# Patient Record
Sex: Female | Born: 1990 | Race: Black or African American | Hispanic: No | Marital: Single | Smoking: Never smoker
Health system: Southern US, Community
[De-identification: ages and names within clinical notes are randomized; demographics above are authoritative.]

## PROBLEM LIST (undated history)

## (undated) ENCOUNTER — Emergency Department (HOSPITAL_COMMUNITY): Admission: EM | Payer: BLUE CROSS/BLUE SHIELD | Source: Home / Self Care

## (undated) DIAGNOSIS — L659 Nonscarring hair loss, unspecified: Secondary | ICD-10-CM

## (undated) DIAGNOSIS — D509 Iron deficiency anemia, unspecified: Secondary | ICD-10-CM

## (undated) DIAGNOSIS — R0789 Other chest pain: Secondary | ICD-10-CM

## (undated) DIAGNOSIS — F419 Anxiety disorder, unspecified: Secondary | ICD-10-CM

## (undated) HISTORY — DX: Nonscarring hair loss, unspecified: L65.9

## (undated) HISTORY — PX: TONSILLECTOMY: SUR1361

---

## 2017-03-31 DIAGNOSIS — F419 Anxiety disorder, unspecified: Secondary | ICD-10-CM | POA: Diagnosis not present

## 2017-03-31 DIAGNOSIS — R202 Paresthesia of skin: Secondary | ICD-10-CM | POA: Diagnosis not present

## 2017-04-13 DIAGNOSIS — R002 Palpitations: Secondary | ICD-10-CM | POA: Diagnosis not present

## 2017-04-16 DIAGNOSIS — R002 Palpitations: Secondary | ICD-10-CM | POA: Diagnosis not present

## 2017-05-18 DIAGNOSIS — H10232 Serous conjunctivitis, except viral, left eye: Secondary | ICD-10-CM | POA: Diagnosis not present

## 2017-05-24 DIAGNOSIS — Z3042 Encounter for surveillance of injectable contraceptive: Secondary | ICD-10-CM | POA: Diagnosis not present

## 2017-05-26 DIAGNOSIS — R0789 Other chest pain: Secondary | ICD-10-CM | POA: Diagnosis not present

## 2017-05-26 DIAGNOSIS — R002 Palpitations: Secondary | ICD-10-CM | POA: Diagnosis not present

## 2017-05-31 DIAGNOSIS — N898 Other specified noninflammatory disorders of vagina: Secondary | ICD-10-CM | POA: Diagnosis not present

## 2017-06-04 DIAGNOSIS — R51 Headache: Secondary | ICD-10-CM | POA: Diagnosis not present

## 2017-06-18 DIAGNOSIS — R079 Chest pain, unspecified: Secondary | ICD-10-CM | POA: Diagnosis not present

## 2017-06-18 DIAGNOSIS — M940 Chondrocostal junction syndrome [Tietze]: Secondary | ICD-10-CM | POA: Diagnosis not present

## 2017-06-28 DIAGNOSIS — N898 Other specified noninflammatory disorders of vagina: Secondary | ICD-10-CM | POA: Diagnosis not present

## 2017-07-03 DIAGNOSIS — B349 Viral infection, unspecified: Secondary | ICD-10-CM | POA: Diagnosis not present

## 2017-07-03 DIAGNOSIS — R51 Headache: Secondary | ICD-10-CM | POA: Diagnosis not present

## 2017-07-07 DIAGNOSIS — R079 Chest pain, unspecified: Secondary | ICD-10-CM | POA: Diagnosis not present

## 2017-07-07 DIAGNOSIS — R000 Tachycardia, unspecified: Secondary | ICD-10-CM | POA: Diagnosis not present

## 2017-07-07 DIAGNOSIS — R457 State of emotional shock and stress, unspecified: Secondary | ICD-10-CM | POA: Diagnosis not present

## 2017-07-12 DIAGNOSIS — H11431 Conjunctival hyperemia, right eye: Secondary | ICD-10-CM | POA: Diagnosis not present

## 2017-07-12 DIAGNOSIS — H5789 Other specified disorders of eye and adnexa: Secondary | ICD-10-CM | POA: Diagnosis not present

## 2017-07-16 DIAGNOSIS — R002 Palpitations: Secondary | ICD-10-CM | POA: Diagnosis not present

## 2017-07-24 DIAGNOSIS — M25572 Pain in left ankle and joints of left foot: Secondary | ICD-10-CM | POA: Diagnosis not present

## 2017-07-24 DIAGNOSIS — M79672 Pain in left foot: Secondary | ICD-10-CM | POA: Diagnosis not present

## 2017-07-29 DIAGNOSIS — X58XXXA Exposure to other specified factors, initial encounter: Secondary | ICD-10-CM | POA: Diagnosis not present

## 2017-07-29 DIAGNOSIS — R002 Palpitations: Secondary | ICD-10-CM | POA: Diagnosis not present

## 2017-07-29 DIAGNOSIS — R079 Chest pain, unspecified: Secondary | ICD-10-CM | POA: Diagnosis not present

## 2017-07-29 DIAGNOSIS — R0789 Other chest pain: Secondary | ICD-10-CM | POA: Diagnosis not present

## 2017-07-29 DIAGNOSIS — F419 Anxiety disorder, unspecified: Secondary | ICD-10-CM | POA: Diagnosis not present

## 2017-07-29 DIAGNOSIS — S29019A Strain of muscle and tendon of unspecified wall of thorax, initial encounter: Secondary | ICD-10-CM | POA: Diagnosis not present

## 2017-07-31 DIAGNOSIS — F458 Other somatoform disorders: Secondary | ICD-10-CM | POA: Diagnosis not present

## 2017-07-31 DIAGNOSIS — F419 Anxiety disorder, unspecified: Secondary | ICD-10-CM | POA: Diagnosis not present

## 2017-07-31 DIAGNOSIS — F99 Mental disorder, not otherwise specified: Secondary | ICD-10-CM | POA: Diagnosis not present

## 2017-07-31 DIAGNOSIS — R000 Tachycardia, unspecified: Secondary | ICD-10-CM | POA: Diagnosis not present

## 2017-08-03 DIAGNOSIS — F410 Panic disorder [episodic paroxysmal anxiety] without agoraphobia: Secondary | ICD-10-CM | POA: Diagnosis not present

## 2017-08-03 DIAGNOSIS — Z111 Encounter for screening for respiratory tuberculosis: Secondary | ICD-10-CM | POA: Diagnosis not present

## 2017-08-03 DIAGNOSIS — F332 Major depressive disorder, recurrent severe without psychotic features: Secondary | ICD-10-CM | POA: Diagnosis not present

## 2017-08-05 DIAGNOSIS — G8929 Other chronic pain: Secondary | ICD-10-CM | POA: Diagnosis not present

## 2017-08-05 DIAGNOSIS — R079 Chest pain, unspecified: Secondary | ICD-10-CM | POA: Diagnosis not present

## 2017-08-05 DIAGNOSIS — F419 Anxiety disorder, unspecified: Secondary | ICD-10-CM | POA: Diagnosis not present

## 2017-08-25 DIAGNOSIS — B373 Candidiasis of vulva and vagina: Secondary | ICD-10-CM | POA: Diagnosis not present

## 2017-08-25 DIAGNOSIS — Z113 Encounter for screening for infections with a predominantly sexual mode of transmission: Secondary | ICD-10-CM | POA: Diagnosis not present

## 2017-08-25 DIAGNOSIS — Z3200 Encounter for pregnancy test, result unknown: Secondary | ICD-10-CM | POA: Diagnosis not present

## 2017-08-25 DIAGNOSIS — Z30013 Encounter for initial prescription of injectable contraceptive: Secondary | ICD-10-CM | POA: Diagnosis not present

## 2017-09-04 ENCOUNTER — Emergency Department (HOSPITAL_COMMUNITY)
Admission: EM | Admit: 2017-09-04 | Discharge: 2017-09-04 | Disposition: A | Discharge status: Home / Self Care | Payer: Self-pay | Attending: Emergency Medicine | Admitting: Emergency Medicine

## 2017-09-04 ENCOUNTER — Encounter (HOSPITAL_COMMUNITY): Payer: Self-pay | Admitting: Emergency Medicine

## 2017-09-04 ENCOUNTER — Emergency Department (HOSPITAL_COMMUNITY): Payer: Self-pay

## 2017-09-04 DIAGNOSIS — G44221 Chronic tension-type headache, intractable: Secondary | ICD-10-CM | POA: Insufficient documentation

## 2017-09-04 DIAGNOSIS — R079 Chest pain, unspecified: Secondary | ICD-10-CM | POA: Insufficient documentation

## 2017-09-04 DIAGNOSIS — Z87891 Personal history of nicotine dependence: Secondary | ICD-10-CM | POA: Insufficient documentation

## 2017-09-04 DIAGNOSIS — R0789 Other chest pain: Secondary | ICD-10-CM

## 2017-09-04 HISTORY — DX: Anxiety disorder, unspecified: F41.9

## 2017-09-04 HISTORY — DX: Other chest pain: R07.89

## 2017-09-04 LAB — CBC
HCT: 33.9 % — ABNORMAL LOW (ref 36.0–46.0)
Hemoglobin: 11.6 g/dL — ABNORMAL LOW (ref 12.0–15.0)
MCH: 30.4 pg (ref 26.0–34.0)
MCHC: 34.2 g/dL (ref 30.0–36.0)
MCV: 89 fL (ref 78.0–100.0)
PLATELETS: 204 10*3/uL (ref 150–400)
RBC: 3.81 MIL/uL — ABNORMAL LOW (ref 3.87–5.11)
RDW: 12.2 % (ref 11.5–15.5)
WBC: 2.3 10*3/uL — AB (ref 4.0–10.5)

## 2017-09-04 LAB — BASIC METABOLIC PANEL
Anion gap: 7 (ref 5–15)
BUN: 7 mg/dL (ref 6–20)
CALCIUM: 9.3 mg/dL (ref 8.9–10.3)
CO2: 24 mmol/L (ref 22–32)
CREATININE: 0.74 mg/dL (ref 0.44–1.00)
Chloride: 108 mmol/L (ref 98–111)
GFR calc non Af Amer: >60 mL/min (ref >60)
Glucose, Bld: 90 mg/dL (ref 70–99)
Potassium: 3.2 mmol/L — ABNORMAL LOW (ref 3.5–5.1)
SODIUM: 139 mmol/L (ref 135–145)

## 2017-09-04 LAB — I-STAT BETA HCG BLOOD, ED (MC, WL, AP ONLY): I-stat hCG, quantitative: <5 m[IU]/mL (ref <5)

## 2017-09-04 LAB — I-STAT TROPONIN, ED: TROPONIN I, POC: 0 ng/mL (ref 0.00–0.08)

## 2017-09-04 MED ORDER — KETOROLAC TROMETHAMINE 30 MG/ML IJ SOLN
60.0000 mg | Freq: Once | INTRAMUSCULAR | Status: AC
  Administered 2017-09-04: 60 mg via INTRAMUSCULAR
  Filled 2017-09-04: qty 2

## 2017-09-04 MED ORDER — DICLOFENAC SODIUM 1 % TD GEL: 4.0000 g | Freq: Four times a day (QID) | TRANSDERMAL | 0 refills | Status: AC

## 2017-09-04 NOTE — ED Triage Notes (Signed)
Initially c/o headache that have been going on for two months.  Seen previously and was told they were tension headache.  Now endorses chest pain.  Reports it was diagnosed as chronic chest wall pain.

## 2017-09-04 NOTE — ED Provider Notes (Signed)
[facility] EMERGENCY DEPARTMENT Provider Note   CSN: [id number] Arrival date & time: 09/04/17  2028     History   Chief Complaint Chief Complaint  Patient presents with  . Headache    HPI Faith Ross is a 27 y.o. female with a history of panic disorder who presents to the ED with chronic headache and chest pain. She reports that her headache is an 8/10 pressure type pain in her bilateral temples, bilateral jaw, and posterior neck, and that this headache has been constantly present for the past 2-3 months. She denies changes in vision, numbness, tingling, weakness, n/v. She states that she has been diagnosed with tension-type headaches in the past. She has tried [facility] powder, Excedrin tension-type, and hot compresses without relief. She has never seen a cardiologist.  Pt reports that her stabbing 6/10 chest pain has been intermittently present for 1 year ever since she strained her muscles while lifting a heavy object at work. She localizes the pain to the bilateral subclavicular space and inferior to the left breast. The pain worsens with breathing and when she lays on her chest. She has tried ibuprofen, naproxen, and flexeril with no relief. She has been evaluated multiple times for this in the past and has been diagnosed in the past with chronic chest wall pain and costochondritis. She states that her panic attacks feel very different from this chest pain and are characterized by shortness of breath and numb arms.  Past Medical History:  Diagnosis Date  . Anxiety   . Chest wall pain     There are no active problems to display for this patient.   Past Surgical History:  Procedure Laterality Date  . TONSILLECTOMY       OB History   None      Home Medications    Prior to Admission medications   Medication Sig Start Date End Date Taking? Authorizing Provider  diclofenac sodium (VOLTAREN) 1 % GEL Apply 4 g topically 4 (four) times daily for 7 days. 09/04/17  09/11/17  Poseidon Pam, Cathleen Corti, MD    Family History No family history on file.  Social History Social History   Tobacco Use  . Smoking status: Former Games developer  . Smokeless tobacco: Never Used  Substance Use Topics  . Alcohol use: Never    Frequency: Never  . Drug use: Never     Allergies   Patient has no allergy information on record.   Review of Systems Review of Systems  Constitutional: Negative for chills and fever.  HENT: Negative for rhinorrhea and sore throat.   Eyes: Negative for visual disturbance.  Respiratory: Negative for cough and shortness of breath.   Cardiovascular: Positive for chest pain. Negative for leg swelling.  Gastrointestinal: Negative for abdominal pain, nausea and vomiting.  Genitourinary: Negative for difficulty urinating and dysuria.  Musculoskeletal: Positive for neck pain and neck stiffness.  Skin: Negative for rash and wound.  Neurological: Positive for headaches. Negative for weakness and numbness.     Physical Exam Updated Vital Signs BP 123/74 (BP Location: Right Arm)   Pulse 82   Temp 98.3 F (36.8 C) (Oral)   Resp 18   Ht 5\' 5"  (1.651 m)   Wt 54.4 kg (120 lb)   SpO2 100%   BMI 19.97 kg/m   Physical Exam  Constitutional: She is oriented to person, place, and time. She appears well-developed and well-nourished. No distress.  HENT:  Head: Normocephalic and atraumatic.  Mouth/Throat: Oropharynx is clear  and moist.  Eyes: Pupils are equal, round, and reactive to light. EOM are normal.  Neck: Normal range of motion. No neck rigidity.  Tense and tender posterior neck muscles.  Cardiovascular: Normal rate and regular rhythm. Exam reveals no gallop and no friction rub.  No murmur heard. Pulmonary/Chest: Effort normal and breath sounds normal. She has no wheezes. She has no rales.  Abdominal: Soft. Bowel sounds are normal. She exhibits no distension. There is no tenderness.  Musculoskeletal: She exhibits no edema.  TTP along the  infraclavicular space and under the left breast. No supra or infraclavicular lymphadenopathy.  Neurological: She is alert and oriented to person, place, and time. She has normal strength.  Skin: Skin is warm and dry. Capillary refill takes less than 2 seconds. No rash noted.  Psychiatric: She has a normal mood and affect. Her behavior is normal.     ED Treatments / Results  Labs (all labs ordered are listed, but only abnormal results are displayed) Labs Reviewed  BASIC METABOLIC PANEL - Abnormal; Notable for the following components:      Result Value   Potassium 3.2 (*)    All other components within normal limits  CBC - Abnormal; Notable for the following components:   WBC 2.3 (*)    RBC 3.81 (*)    Hemoglobin 11.6 (*)    HCT 33.9 (*)    All other components within normal limits  I-STAT TROPONIN, ED  I-STAT BETA HCG BLOOD, ED ([facility], [facility], [facility] ONLY)    EKG EKG Interpretation  Date/Time:  Saturday September 04 2017 20:39:22 EDT Ventricular Rate:  73 PR Interval:  142 QRS Duration: 72 QT Interval:  364 QTC Calculation: 401 R Axis:   69 Text Interpretation:  Normal sinus rhythm Normal ECG Confirmed by Gerhard MunchLockwood, Robert [id number]) on 09/04/2017 10:33:52 PM   Radiology Dg Chest 2 View  Result Date: 09/04/2017 CLINICAL DATA:  27 year old female with chest pain. EXAM: CHEST - 2 VIEW COMPARISON:  None. FINDINGS: The heart size and mediastinal contours are within normal limits. Both lungs are clear. The visualized skeletal structures are unremarkable. IMPRESSION: No active cardiopulmonary disease. Electronically Signed   By: Elgie CollardArash  Radparvar M.D.   On: 09/04/2017 21:19    Procedures Procedures (including critical care time)  Medications Ordered in ED Medications  ketorolac (TORADOL) 30 MG/ML injection 60 mg (has no administration in time range)     Initial Impression / Assessment and Plan / ED Course  I have reviewed the triage vital signs and the nursing notes.  Pertinent labs &  imaging results that were available during my care of the patient were reviewed by me and considered in my medical decision making (see chart for details).  Faith Ross is a 27 y.o. female with a history of panic disorder who presents to the ED with chronic headache for the past 2-3 months and chronic chest pain for the past year. She has been evaluated for these two problems multiple times in the past at outside EDs and is consistently diagnosed with tension-type headaches and chronic chest wall tenderness. However, all of the interventions she's tried have brought no relief. On arrival to the ED, pt is afebrile and hemodynamically stable. Physical exam is significant for tense and tender posterior neck muscles as well as chest wall tenderness. Labs are significant for negative troponin. EKG is NSR. CXR was unremarkable. Based on her history and benign work-up, her previous diagnoses seem appropriate. Pt was treated with IM toradol. Pt safe  for discharge home with return precautions of worsening or changing headaches and chest pain, shortness of breath, or changes in vision. Advised patient to f/u with neurology for chronic tension-type headaches and establish with a PCP given that she just moved to the area. Patient discharged home with a prescription for voltaren gel to apply to the areas of soreness on her chest wall.   Final Clinical Impressions(s) / ED Diagnoses   Final diagnoses:  Chronic tension-type headache, intractable  Chest wall tenderness    ED Discharge Orders        Ordered    diclofenac sodium (VOLTAREN) 1 % GEL  4 times daily     09/04/17 2242       Keith Cancio, Cathleen Corti, MD 09/04/17 2321    Gerhard Munch, MD 09/05/17 0000

## 2017-09-09 DIAGNOSIS — G44221 Chronic tension-type headache, intractable: Secondary | ICD-10-CM | POA: Diagnosis not present

## 2017-09-09 DIAGNOSIS — F419 Anxiety disorder, unspecified: Secondary | ICD-10-CM | POA: Diagnosis not present

## 2017-09-29 DIAGNOSIS — F411 Generalized anxiety disorder: Secondary | ICD-10-CM | POA: Diagnosis not present

## 2017-10-13 DIAGNOSIS — F410 Panic disorder [episodic paroxysmal anxiety] without agoraphobia: Secondary | ICD-10-CM | POA: Diagnosis not present

## 2017-10-18 DIAGNOSIS — G43009 Migraine without aura, not intractable, without status migrainosus: Secondary | ICD-10-CM | POA: Diagnosis not present

## 2017-10-18 DIAGNOSIS — M5412 Radiculopathy, cervical region: Secondary | ICD-10-CM | POA: Diagnosis not present

## 2017-10-18 DIAGNOSIS — M9901 Segmental and somatic dysfunction of cervical region: Secondary | ICD-10-CM | POA: Diagnosis not present

## 2017-10-20 DIAGNOSIS — F410 Panic disorder [episodic paroxysmal anxiety] without agoraphobia: Secondary | ICD-10-CM | POA: Diagnosis not present

## 2017-10-26 DIAGNOSIS — M9901 Segmental and somatic dysfunction of cervical region: Secondary | ICD-10-CM | POA: Diagnosis not present

## 2017-10-26 DIAGNOSIS — G43009 Migraine without aura, not intractable, without status migrainosus: Secondary | ICD-10-CM | POA: Diagnosis not present

## 2017-10-26 DIAGNOSIS — M5412 Radiculopathy, cervical region: Secondary | ICD-10-CM | POA: Diagnosis not present

## 2017-10-27 DIAGNOSIS — M5412 Radiculopathy, cervical region: Secondary | ICD-10-CM | POA: Diagnosis not present

## 2017-10-27 DIAGNOSIS — G43009 Migraine without aura, not intractable, without status migrainosus: Secondary | ICD-10-CM | POA: Diagnosis not present

## 2017-10-27 DIAGNOSIS — M9901 Segmental and somatic dysfunction of cervical region: Secondary | ICD-10-CM | POA: Diagnosis not present

## 2017-10-28 DIAGNOSIS — F410 Panic disorder [episodic paroxysmal anxiety] without agoraphobia: Secondary | ICD-10-CM | POA: Diagnosis not present

## 2017-11-01 DIAGNOSIS — M5412 Radiculopathy, cervical region: Secondary | ICD-10-CM | POA: Diagnosis not present

## 2017-11-01 DIAGNOSIS — G43009 Migraine without aura, not intractable, without status migrainosus: Secondary | ICD-10-CM | POA: Diagnosis not present

## 2017-11-01 DIAGNOSIS — M9901 Segmental and somatic dysfunction of cervical region: Secondary | ICD-10-CM | POA: Diagnosis not present

## 2017-11-22 DIAGNOSIS — M9901 Segmental and somatic dysfunction of cervical region: Secondary | ICD-10-CM | POA: Diagnosis not present

## 2017-11-22 DIAGNOSIS — G43009 Migraine without aura, not intractable, without status migrainosus: Secondary | ICD-10-CM | POA: Diagnosis not present

## 2017-11-22 DIAGNOSIS — M5412 Radiculopathy, cervical region: Secondary | ICD-10-CM | POA: Diagnosis not present

## 2017-11-30 ENCOUNTER — Emergency Department (HOSPITAL_COMMUNITY)
Admission: EM | Admit: 2017-11-30 | Discharge: 2017-11-30 | Disposition: A | Discharge status: Home / Self Care | Payer: BLUE CROSS/BLUE SHIELD | Attending: Emergency Medicine | Admitting: Emergency Medicine

## 2017-11-30 ENCOUNTER — Encounter: Payer: Self-pay | Admitting: Emergency Medicine

## 2017-11-30 ENCOUNTER — Emergency Department (HOSPITAL_COMMUNITY): Payer: BLUE CROSS/BLUE SHIELD

## 2017-11-30 DIAGNOSIS — R079 Chest pain, unspecified: Secondary | ICD-10-CM | POA: Diagnosis not present

## 2017-11-30 DIAGNOSIS — F419 Anxiety disorder, unspecified: Secondary | ICD-10-CM | POA: Insufficient documentation

## 2017-11-30 DIAGNOSIS — R000 Tachycardia, unspecified: Secondary | ICD-10-CM | POA: Diagnosis not present

## 2017-11-30 DIAGNOSIS — R002 Palpitations: Secondary | ICD-10-CM | POA: Diagnosis not present

## 2017-11-30 DIAGNOSIS — Z79899 Other long term (current) drug therapy: Secondary | ICD-10-CM | POA: Diagnosis not present

## 2017-11-30 DIAGNOSIS — E876 Hypokalemia: Secondary | ICD-10-CM | POA: Insufficient documentation

## 2017-11-30 DIAGNOSIS — D709 Neutropenia, unspecified: Secondary | ICD-10-CM | POA: Diagnosis not present

## 2017-11-30 DIAGNOSIS — R457 State of emotional shock and stress, unspecified: Secondary | ICD-10-CM | POA: Diagnosis not present

## 2017-11-30 LAB — URINALYSIS, ROUTINE W REFLEX MICROSCOPIC
BILIRUBIN URINE: NEGATIVE
Glucose, UA: NEGATIVE mg/dL
HGB URINE DIPSTICK: NEGATIVE
KETONES UR: NEGATIVE mg/dL
NITRITE: NEGATIVE
Protein, ur: NEGATIVE mg/dL
SPECIFIC GRAVITY, URINE: 1.006 (ref 1.005–1.030)
pH: 6 (ref 5.0–8.0)

## 2017-11-30 LAB — CBC WITH DIFFERENTIAL/PLATELET
BASOS ABS: 0 10*3/uL (ref 0.0–0.1)
BASOS PCT: 0 %
EOS ABS: 0 10*3/uL (ref 0.0–0.5)
Eosinophils Relative: 0 %
HCT: 32 % — ABNORMAL LOW (ref 36.0–46.0)
Hemoglobin: 11.1 g/dL — ABNORMAL LOW (ref 12.0–15.0)
Lymphocytes Relative: 70 %
Lymphs Abs: 1.3 10*3/uL (ref 0.7–4.0)
MCH: 30.7 pg (ref 26.0–34.0)
MCHC: 34.7 g/dL (ref 30.0–36.0)
MCV: 88.6 fL (ref 80.0–100.0)
Monocytes Absolute: 0.2 10*3/uL (ref 0.1–1.0)
Monocytes Relative: 12 %
NEUTROS PCT: 18 %
NRBC: 0 % (ref 0.0–0.2)
Neutro Abs: 0.3 10*3/uL — ABNORMAL LOW (ref 1.7–7.7)
PLATELETS: 205 10*3/uL (ref 150–400)
RBC: 3.61 MIL/uL — ABNORMAL LOW (ref 3.87–5.11)
RDW: 11.9 % (ref 11.5–15.5)
WBC: 1.8 10*3/uL — ABNORMAL LOW (ref 4.0–10.5)

## 2017-11-30 LAB — COMPREHENSIVE METABOLIC PANEL
ALBUMIN: 3.2 g/dL — AB (ref 3.5–5.0)
ALT: 16 U/L (ref 0–44)
AST: 18 U/L (ref 15–41)
Alkaline Phosphatase: 28 U/L — ABNORMAL LOW (ref 38–126)
Anion gap: 5 (ref 5–15)
BILIRUBIN TOTAL: 0.4 mg/dL (ref 0.3–1.2)
BUN: 6 mg/dL (ref 6–20)
CO2: 23 mmol/L (ref 22–32)
Calcium: 8.2 mg/dL — ABNORMAL LOW (ref 8.9–10.3)
Chloride: 114 mmol/L — ABNORMAL HIGH (ref 98–111)
Creatinine, Ser: 0.71 mg/dL (ref 0.44–1.00)
GFR calc Af Amer: >60 mL/min (ref >60)
Glucose, Bld: 77 mg/dL (ref 70–99)
Potassium: 3.1 mmol/L — ABNORMAL LOW (ref 3.5–5.1)
Sodium: 142 mmol/L (ref 135–145)
TOTAL PROTEIN: 6.5 g/dL (ref 6.5–8.1)

## 2017-11-30 LAB — RAPID URINE DRUG SCREEN, HOSP PERFORMED
Amphetamines: NOT DETECTED
Barbiturates: NOT DETECTED
Benzodiazepines: NOT DETECTED
COCAINE: NOT DETECTED
Opiates: NOT DETECTED
TETRAHYDROCANNABINOL: NOT DETECTED

## 2017-11-30 LAB — I-STAT BETA HCG BLOOD, ED (MC, WL, AP ONLY)

## 2017-11-30 LAB — MAGNESIUM: Magnesium: 1.9 mg/dL (ref 1.7–2.4)

## 2017-11-30 LAB — I-STAT TROPONIN, ED: TROPONIN I, POC: 0 ng/mL (ref 0.00–0.08)

## 2017-11-30 MED ORDER — POTASSIUM CHLORIDE CRYS ER 20 MEQ PO TBCR
40.0000 meq | EXTENDED_RELEASE_TABLET | Freq: Once | ORAL | Status: AC
  Administered 2017-11-30: 40 meq via ORAL
  Filled 2017-11-30: qty 2

## 2017-11-30 MED ORDER — SODIUM CHLORIDE 0.9 % IV BOLUS
1000.0000 mL | Freq: Once | INTRAVENOUS | Status: AC
  Administered 2017-11-30: 1000 mL via INTRAVENOUS

## 2017-11-30 MED ORDER — METOPROLOL TARTRATE 25 MG PO TABS
12.5000 mg | ORAL_TABLET | Freq: Once | ORAL | Status: AC
  Administered 2017-11-30: 12.5 mg via ORAL
  Filled 2017-11-30: qty 1

## 2017-11-30 MED ORDER — KETOROLAC TROMETHAMINE 30 MG/ML IJ SOLN
15.0000 mg | Freq: Once | INTRAMUSCULAR | Status: AC
  Administered 2017-11-30: 15 mg via INTRAVENOUS
  Filled 2017-11-30: qty 1

## 2017-11-30 MED ORDER — METOPROLOL TARTRATE 25 MG PO TABS: 12.5000 mg | ORAL_TABLET | Freq: Two times a day (BID) | ORAL | 1 refills | Status: DC

## 2017-11-30 NOTE — ED Provider Notes (Signed)
Developed palpitations and feeling of rapid heartbeat onset 10 AM today.  Lasted 2 hours dull spontaneously.  Presently asymptomatic.  Denies any shortness of breath.  Denies feeling anxious.  No other associated symptoms.  On exam no distress lungs clear to auscultation heart regular rate and rhythm no murmurs   Doug Sou, MD 11/30/17 1706

## 2017-11-30 NOTE — ED Notes (Signed)
Pt stable, ambulatory, states understanding of discharge instructions 

## 2017-11-30 NOTE — ED Triage Notes (Signed)
Pt arrived via [facility] from home c/o several days of heart palpitations only when she is lying flat trying to go to sleep.  Pt states she has anxiety but doesn't have any medications for it.  Denies any chest pain.

## 2017-11-30 NOTE — ED Provider Notes (Signed)
[facility] EMERGENCY DEPARTMENT Provider Note   CSN: [id number] Arrival date & time: 11/30/17  1309     History   Chief Complaint Chief Complaint  Patient presents with  . Palpitations  . Anxiety    HPI Faith Ross is a 27 y.o. female.  HPI  Faith Ross is a 27 y.o. female, with a history of anxiety and palpitations, presenting to the ED with palpitations intermittently for the past year.  They only occur at times when she lays down to go to sleep. She does not know how long they last, but she is able to sleep.  Today when she laid down for a nap, she had the palpitations, but continued for about 20 minutes, accompanied by left chest pain, described as an aching, 3/10, nonradiating. States the area is still sore after the palpitations resolved. EMS noted patient's pulse around 150. Resolved with IV placement.   She was evaluated by a cardiologist earlier this year and she states the only thing they found was PACs. She has had multiple tests, including heart monitors and thyroid testing.   Denies syncope, shortness of breath, cough, fever/chills, N/V/D, abdominal pain, or any other complaints.    Past Medical History:  Diagnosis Date  . Anxiety   . Chest wall pain     There are no active problems to display for this patient.   Past Surgical History:  Procedure Laterality Date  . TONSILLECTOMY       OB History   None      Home Medications    Prior to Admission medications   Medication Sig Start Date End Date Taking? Authorizing Provider  medroxyPROGESTERone (DEPO-PROVERA) 150 MG/ML injection Inject 150 mg into the muscle every 3 (three) months. 05/24/17  Yes [provider]  metoprolol tartrate (LOPRESSOR) 25 MG tablet Take 0.5 tablets (12.5 mg total) by mouth 2 (two) times daily. 11/30/17 01/29/18  Anselm Pancoast, PA-C    Family History History reviewed. No pertinent family history.  Social History Social History   Tobacco  Use  . Smoking status: Former Games developer  . Smokeless tobacco: Never Used  Substance Use Topics  . Alcohol use: Never    Frequency: Never  . Drug use: Never     Allergies   Patient has no known allergies.   Review of Systems Review of Systems  Constitutional: Negative for chills, diaphoresis and fever.  Respiratory: Negative for shortness of breath.   Cardiovascular: Positive for chest pain and palpitations. Negative for leg swelling.  Gastrointestinal: Negative for abdominal pain, diarrhea, nausea and vomiting.  Neurological: Negative for dizziness, syncope and light-headedness.  All other systems reviewed and are negative.    Physical Exam Updated Vital Signs BP 109/82   Pulse 84   Temp 98.4 F (36.9 C) (Oral)   Resp 16   SpO2 98%   Physical Exam  Constitutional: She appears well-developed and well-nourished. No distress.  HENT:  Head: Normocephalic and atraumatic.  Eyes: Conjunctivae are normal.  Neck: Neck supple.  Cardiovascular: Normal rate, regular rhythm, normal heart sounds and intact distal pulses.  Pulmonary/Chest: Effort normal and breath sounds normal. No respiratory distress. She exhibits tenderness.    Abdominal: Soft. There is no tenderness. There is no guarding.  Musculoskeletal: She exhibits no edema.  Lymphadenopathy:    She has no cervical adenopathy.  Neurological: She is alert.  Skin: Skin is warm and dry. She is not diaphoretic.  Psychiatric: She has a normal mood and affect. Her  behavior is normal.  Nursing note and vitals reviewed.    ED Treatments / Results  Labs (all labs ordered are listed, but only abnormal results are displayed) Labs Reviewed  URINALYSIS, ROUTINE W REFLEX MICROSCOPIC - Abnormal; Notable for the following components:      Result Value   APPearance HAZY (*)    Leukocytes, UA TRACE (*)    Bacteria, UA RARE (*)    All other components within normal limits  COMPREHENSIVE METABOLIC PANEL - Abnormal; Notable for the  following components:   Potassium 3.1 (*)    Chloride 114 (*)    Calcium 8.2 (*)    Albumin 3.2 (*)    Alkaline Phosphatase 28 (*)    All other components within normal limits  CBC WITH DIFFERENTIAL/PLATELET - Abnormal; Notable for the following components:   WBC 1.8 (*)    RBC 3.61 (*)    Hemoglobin 11.1 (*)    HCT 32.0 (*)    Neutro Abs 0.3 (*)    All other components within normal limits  RAPID URINE DRUG SCREEN, HOSP PERFORMED  MAGNESIUM  I-STAT BETA HCG BLOOD, ED ([facility], [facility], [facility] ONLY)  I-STAT TROPONIN, ED    EKG EKG Interpretation  Date/Time:  Tuesday November 30 2017 13:25:05 EDT Ventricular Rate:  84 PR Interval:    QRS Duration: 84 QT Interval:  355 QTC Calculation: 420 R Axis:   77 Text Interpretation:  Sinus rhythm Artifact in lead(s) V3 No significant change since last tracing Confirmed by Doug Sou [phone]) on 11/30/2017 4:35:17 PM   Radiology Dg Chest 2 View  Result Date: 11/30/2017 CLINICAL DATA:  Chest pain and heart palpitations EXAM: CHEST - 2 VIEW COMPARISON:  09/04/2017 FINDINGS: Artifact from EKG leads. Normal heart size and mediastinal contours. No acute infiltrate or edema. No effusion or pneumothorax. No acute osseous findings. IMPRESSION: Negative chest. Electronically Signed   By: Marnee Spring M.D.   On: 11/30/2017 15:49    Procedures Procedures (including critical care time)  Medications Ordered in ED Medications  sodium chloride 0.9 % bolus 1,000 mL (0 mLs Intravenous Stopped 11/30/17 1656)  ketorolac (TORADOL) 30 MG/ML injection 15 mg (15 mg Intravenous Given 11/30/17 1548)  potassium chloride SA (K-DUR,KLOR-CON) CR tablet 40 mEq (40 mEq Oral Given 11/30/17 1659)  metoprolol tartrate (LOPRESSOR) tablet 12.5 mg (12.5 mg Oral Given 11/30/17 1920)     Initial Impression / Assessment and Plan / ED Course  I have reviewed the triage vital signs and the nursing notes.  Pertinent labs & imaging results that were available during my care of the  patient were reviewed by me and considered in my medical decision making (see chart for details).  Clinical Course as of Nov 30 2000  Tue Nov 30, 2017  1801 Spoke with Cardiology PA, Lisabeth Devoid. States he will go see the patient.    [SJ]    Clinical Course User Index [SJ] Joy, Shawn C, PA-C   Patient presents with palpitations that resolved prior to arrival.  She did have an instance of these palpitations here in the ED.  She was evaluated by cardiology, who recommended 12.5 mg metoprolol twice daily.  She will follow-up with cardiology in the office. The patient was given instructions for home care as well as return precautions. Patient voices understanding of these instructions, accepts the plan, and is comfortable with discharge.   Findings and plan of care discussed with Doug Sou, MD. Dr. Ethelda Chick personally evaluated and examined this patient.   Final  Clinical Impressions(s) / ED Diagnoses   Final diagnoses:  Palpitations  Hypokalemia    ED Discharge Orders         Ordered    metoprolol tartrate (LOPRESSOR) 25 MG tablet  2 times daily     11/30/17 1912           Concepcion Living 11/30/17 2003    Doug Sou, MD 11/30/17 [phone]

## 2017-11-30 NOTE — ED Notes (Signed)
Called main lab for the magnesium add on that was modified and sent an hour ago. Lab states they will look for the tube now.

## 2017-11-30 NOTE — ED Notes (Signed)
Magnesium changed to add on

## 2017-11-30 NOTE — ED Notes (Signed)
Pts alarm rang out tachy and monitor strip was printed and given to Dr. Rennis Chris. Also copy placed in medical records

## 2017-11-30 NOTE — ED Notes (Signed)
Patient transported to X-ray 

## 2017-11-30 NOTE — Progress Notes (Signed)
Tele strip reviewed with Dr. [place], felt to be sinus tach. Discussed EDP, as long as patient did not have dizziness associated with tachypalpitation. It is reasonable to discharge with a trial of metoprolol tartrate 12.5mg  BID and followup in the cardiology office within a week for reassessment.   Ramond Dial PA Pager: [phone]

## 2017-11-30 NOTE — Discharge Instructions (Addendum)
Begin taking the metoprolol, medication meant to prevent your heart from racing, twice daily.  Follow-up with cardiology this week.  Call the number provided to set up an appointment.  Return to the ED for recurrent symptoms despite medication, worsening symptoms, or any other major concerns.  Your potassium was lower than normal today.  Follow-up with a primary care provider on this matter.

## 2017-12-01 ENCOUNTER — Ambulatory Visit: Payer: BLUE CROSS/BLUE SHIELD | Admitting: Family Medicine

## 2017-12-01 ENCOUNTER — Ambulatory Visit (INDEPENDENT_AMBULATORY_CARE_PROVIDER_SITE_OTHER): Payer: BLUE CROSS/BLUE SHIELD | Admitting: Family Medicine

## 2017-12-01 VITALS — BP 101/67 | HR 66 | Temp 97.5°F | Resp 17 | Ht 64.0 in | Wt 118.2 lb

## 2017-12-01 DIAGNOSIS — F411 Generalized anxiety disorder: Secondary | ICD-10-CM

## 2017-12-01 DIAGNOSIS — R079 Chest pain, unspecified: Secondary | ICD-10-CM

## 2017-12-01 DIAGNOSIS — R002 Palpitations: Secondary | ICD-10-CM | POA: Diagnosis not present

## 2017-12-01 MED ORDER — BUSPIRONE HCL 10 MG PO TABS: 10.0000 mg | ORAL_TABLET | Freq: Three times a day (TID) | ORAL | 1 refills | Status: DC

## 2017-12-01 MED ORDER — ESCITALOPRAM OXALATE 10 MG PO TABS: 10.0000 mg | ORAL_TABLET | Freq: Every day | ORAL | 0 refills | Status: DC

## 2017-12-01 MED ORDER — METOPROLOL TARTRATE 25 MG PO TABS: 12.5000 mg | ORAL_TABLET | Freq: Every day | ORAL | 0 refills | Status: DC | PRN

## 2017-12-01 NOTE — Patient Instructions (Signed)
    Thank you for choosing Primary Care at [facility] for your medical home!    Truc Winfree was seen by Joaquin Courts, FNP today.   Bonnita Nasuti primary care doctor is Bing Neighbors, FNP.   For the best care possible,  you should try to see @PCP  whenever you come to clinic.   We look forward to seeing you again soon!  If you have any questions about your visit today,  please call us at   Or feel free to reach your provider via MyChart.

## 2017-12-01 NOTE — Progress Notes (Addendum)
Faith Ross, is a 27 y.o. female  WJX:[id number]  OZH:[id number]  DOB - [birth year]  CC:  Chief Complaint  Patient presents with  . Establish Care  . Follow-up    seen at ED on 11/30/17 for palpitations. no fam hx of heart problems. was prescribed Metoprolol. states that she didn't start taking it b/c she was on it in the past & felt like it caused her heart rate to drop really slow    HPI: Faith Ross is a 27 y.o. female is here today to establish care.    Chronic health problems include:does not have a problem list on file.   Current medications: Current Outpatient Medications:  .  medroxyPROGESTERone (DEPO-PROVERA) 150 MG/ML injection, Inject 150 mg into the muscle every 3 (three) months., Disp: , Rfl:  .  metoprolol tartrate (LOPRESSOR) 25 MG tablet, Take 0.5 tablets (12.5 mg total) by mouth 2 (two) times daily. (Patient not taking: Reported on 12/01/2017), Disp: 30 tablet, Rfl: 1   Pertinent family medical history: family history includes Thyroid disease in her mother.    Concerns related to today's visit:  Patient presents today for an ER follow-up. She has routinely received medical care from ER  and or urgent care. Most recent visit 11/30/2017 presented to the ER with a complaint of palpitations with a HR 150's. EKG was normal. Complained of associated chest pain which has since resolved. Previously followed by cardiology and underwent an extensive cardiac work-up which was negative. She was told that she had PAC.  History of anxiety-no current medication management for symptoms.  For palpitations she was started on metoprolol and discharged for follow-up here today. At present she denies new headaches, chest pain, abdominal pain, nausea, new weakness , numbness or tingling, SOB, edema, worrisome cough, suicidal or homicidal ideations.  No Known Allergies  Social History   Socioeconomic History  . Marital status: Single    Spouse name: Not on file  . Number of children: Not  on file  . Years of education: Not on file  . Highest education level: Not on file  Occupational History  . Not on file  Social Needs  . Financial resource strain: Not on file  . Food insecurity:    Worry: Not on file    Inability: Not on file  . Transportation needs:    Medical: Not on file    Non-medical: Not on file  Tobacco Use  . Smoking status: Former Games developer  . Smokeless tobacco: Never Used  Substance and Sexual Activity  . Alcohol use: Never    Frequency: Never  . Drug use: Never  . Sexual activity: Not on file  Lifestyle  . Physical activity:    Days per week: Not on file    Minutes per session: Not on file  . Stress: Not on file  Relationships  . Social connections:    Talks on phone: Not on file    Gets together: Not on file    Attends religious service: Not on file    Active member of club or organization: Not on file    Attends meetings of clubs or organizations: Not on file    Relationship status: Not on file  . Intimate partner violence:    Fear of current or ex partner: Not on file    Emotionally abused: Not on file    Physically abused: Not on file    Forced sexual activity: Not on file  Other Topics Concern  . Not on file  Social  History Narrative  . Not on file    Review of Systems: Review of Systems  Constitutional: Negative.   Eyes: Negative.   Respiratory: Negative.   Cardiovascular:       History of recurrent atypical chest pain and palpitations.  None at present  Gastrointestinal: Negative.   Psychiatric/Behavioral: Negative for depression, hallucinations, memory loss, substance abuse and suicidal ideas. The patient is nervous/anxious. The patient does not have insomnia.      Objective:   Vitals:   12/01/17 1454  BP: 101/67  Pulse: 66  Resp: 17  Temp: (!) 97.5 F (36.4 C)  SpO2: 100%    Physical Exam: Constitutional: Patient appears very thin, poorly -nourished. No distress. HENT: Normocephalic, atraumatic, External right  and left ear normal.  Eyes: Conjunctivae and EOM are normal. PERRLA, no scleral icterus. Neck: Normal ROM. Neck supple. No JVD. No tracheal deviation. No thyromegaly. CVS: RRR, S1/S2 +, no murmurs, no gallops, no carotid bruit.  Pulmonary: Effort and breath sounds normal, no stridor, rhonchi, wheezes, rales.  Abdominal: Soft. BS +, no distension, tenderness, rebound or guarding.  Musculoskeletal: Normal range of motion. No edema and no tenderness.  Neuro: Alert. Normal muscle tone coordination.  Skin: Skin is warm and dry. No rash noted. Not diaphoretic. No erythema. No pallor. Psychiatric: Very anxious with a flat affect. Behavior, judgment, thought content normal.  Lab Results  Component Value Date   WBC 1.8 (L) 11/30/2017   HGB 11.1 (L) 11/30/2017   HCT 32.0 (L) 11/30/2017   MCV 88.6 11/30/2017   PLT 205 11/30/2017   Lab Results  Component Value Date   CREATININE 0.71 11/30/2017   BUN 6 11/30/2017   NA 142 11/30/2017   K 3.1 (L) 11/30/2017   CL 114 (H) 11/30/2017   CO2 23 11/30/2017    No results found for: HGBA1C  Lipid Panel  No results found for: CHOL, TRIG, HDL, CHOLHDL, VLDL, LDLCALC      Assessment and plan:  1. Generalized Anxiety Disorder , persistent , currently active Will trial patient on sertraline 20 mg daily at bedtime. For persistent panic and anxiety attacks will start on buspirone 10 mg  2-3 times daily. Encouraged patient to resume counseling, which she discontinued a few weeks prior now that she is on medication. Dual therapy may yield better outcomes  2. Chest pain, unspecified type, resolved. This is more than likely related to anxiety.  In review of [facility] she does have a documented history of PACs which are typically benign and associated with excessive caffeine use. If episodes recur, will obtain an EKG to evaluate need for further work-up with cardiology.  3. Palpitations, soft for now.  Will make  metoprolol 12.5 mg once daily as needed only in the event palpitations occur.  Patient's blood pressure is rather soft and she reports associated fatigue with taking metoprolol likely related to a drop in blood pressure.  Patient verbalized agreement and understanding of plan.  Return in about 4 weeks (around 12/29/2017), or anxiety follow-up .  The patient was given clear instructions to go to ER or return to medical center if symptoms don't improve, worsen or new problems develop. The patient verbalized understanding. The patient was told to call to get lab results if they haven't heard anything in the next week.    Joaquin Courts, FNP Primary Care at [facility] [address] [place] [address] [phone]: [phone]   A total of 30 minutes spent, greater  than 50 % of this time was spent counseling and coordination of care.  This note has been created with Education officer, environmental. Any transcriptional errors are unintentional.

## 2017-12-01 NOTE — Progress Notes (Deleted)
Faith Ross, is a 27 y.o. female  ONG:[id number]  GMW:[id number]  DOB - 1990-12-24  CC: No chief complaint on file.      HPI: Faith Ross is a 27 y.o. female is here today to establish care. No recent healthcare provider. No significant medical history noted.    Patient presented to the ER on yesterday with a complaint of anxiety and chest wall pain. She found to have hypokalemic K+ 3.1 and complained of palpations.    Current medications: Current Outpatient Medications:  .  medroxyPROGESTERone (DEPO-PROVERA) 150 MG/ML injection, Inject 150 mg into the muscle every 3 (three) months., Disp: , Rfl:  .  metoprolol tartrate (LOPRESSOR) 25 MG tablet, Take 0.5 tablets (12.5 mg total) by mouth 2 (two) times daily., Disp: 30 tablet, Rfl: 1   Pertinent family medical history: family history is not on file.      No Known Allergies  Social History   Socioeconomic History  . Marital status: Single    Spouse name: Not on file  . Number of children: Not on file  . Years of education: Not on file  . Highest education level: Not on file  Occupational History  . Not on file  Social Needs  . Financial resource strain: Not on file  . Food insecurity:    Worry: Not on file    Inability: Not on file  . Transportation needs:    Medical: Not on file    Non-medical: Not on file  Tobacco Use  . Smoking status: Former Games developer  . Smokeless tobacco: Never Used  Substance and Sexual Activity  . Alcohol use: Never    Frequency: Never  . Drug use: Never  . Sexual activity: Not on file  Lifestyle  . Physical activity:    Days per week: Not on file    Minutes per session: Not on file  . Stress: Not on file  Relationships  . Social connections:    Talks on phone: Not on file    Gets together: Not on file    Attends religious service: Not on file    Active member of club or organization: Not on file    Attends meetings of clubs or organizations: Not on file    Relationship status: Not  on file  . Intimate partner violence:    Fear of current or ex partner: Not on file    Emotionally abused: Not on file    Physically abused: Not on file    Forced sexual activity: Not on file  Other Topics Concern  . Not on file  Social History Narrative  . Not on file    Review of Systems: Constitutional: Negative for fever, chills, diaphoresis, activity change, appetite change and fatigue. HENT: Negative for ear pain, nosebleeds, congestion, facial swelling, rhinorrhea, neck pain, neck stiffness and ear discharge.  Eyes: Negative for pain, discharge, redness, itching and visual disturbance. Respiratory: Negative for cough, choking, chest tightness, shortness of breath, wheezing and stridor.  Cardiovascular: Negative for chest pain, palpitations and leg swelling. Gastrointestinal: Negative for abdominal distention. Genitourinary: Negative for dysuria, urgency, frequency, hematuria, flank pain, decreased urine volume, difficulty urinating and dyspareunia.  Musculoskeletal: Negative for back pain, joint swelling, arthralgia and gait problem. Neurological: Negative for dizziness, tremors, seizures, syncope, facial asymmetry, speech difficulty, weakness, light-headedness, numbness and headaches.  Hematological: Negative for adenopathy. Does not bruise/bleed easily. Psychiatric/Behavioral: Negative for hallucinations, behavioral problems, confusion, dysphoric mood, decreased concentration and agitation.    Objective:  There were no vitals filed  for this visit.  Physical Exam: Constitutional: Patient appears well-developed and well-nourished. No distress. HENT: Normocephalic, atraumatic, External right and left ear normal. Oropharynx is clear and moist.  Eyes: Conjunctivae and EOM are normal. PERRLA, no scleral icterus. Neck: Normal ROM. Neck supple. No JVD. No tracheal deviation. No thyromegaly. CVS: RRR, S1/S2 +, no murmurs, no gallops, no carotid bruit.  Pulmonary: Effort and breath  sounds normal, no stridor, rhonchi, wheezes, rales.  Abdominal: Soft. BS +, no distension, tenderness, rebound or guarding.  Musculoskeletal: Normal range of motion. No edema and no tenderness.  Lymphadenopathy: No lymphadenopathy noted, cervical, inguinal or axillary Neuro: Alert. Normal reflexes, muscle tone coordination. No cranial nerve deficit. Skin: Skin is warm and dry. No rash noted. Not diaphoretic. No erythema. No pallor. Psychiatric: Normal mood and affect. Behavior, judgment, thought content normal.  Lab Results  Component Value Date   WBC 1.8 (L) 11/30/2017   HGB 11.1 (L) 11/30/2017   HCT 32.0 (L) 11/30/2017   MCV 88.6 11/30/2017   PLT 205 11/30/2017   Lab Results  Component Value Date   CREATININE 0.71 11/30/2017   BUN 6 11/30/2017   NA 142 11/30/2017   K 3.1 (L) 11/30/2017   CL 114 (H) 11/30/2017   CO2 23 11/30/2017    No results found for: HGBA1C  Lipid Panel  No results found for: CHOL, TRIG, HDL, CHOLHDL, VLDL, LDLCALC      Assessment and plan:   There are no diagnoses linked to this encounter.  No follow-ups on file.  The patient was given clear instructions to go to ER or return to medical center if symptoms don't improve, worsen or new problems develop. The patient verbalized understanding. The patient was told to call to get lab results if they haven't heard anything in the next week.     This note has been created with Education officer, environmental. Any transcriptional errors are unintentional.

## 2017-12-02 DIAGNOSIS — R002 Palpitations: Secondary | ICD-10-CM | POA: Insufficient documentation

## 2017-12-02 DIAGNOSIS — R079 Chest pain, unspecified: Secondary | ICD-10-CM | POA: Insufficient documentation

## 2017-12-02 DIAGNOSIS — F419 Anxiety disorder, unspecified: Secondary | ICD-10-CM | POA: Insufficient documentation

## 2017-12-02 LAB — PATHOLOGIST SMEAR REVIEW

## 2017-12-03 DIAGNOSIS — L638 Other alopecia areata: Secondary | ICD-10-CM | POA: Diagnosis not present

## 2017-12-07 DIAGNOSIS — Z3042 Encounter for surveillance of injectable contraceptive: Secondary | ICD-10-CM | POA: Diagnosis not present

## 2017-12-07 DIAGNOSIS — Z3200 Encounter for pregnancy test, result unknown: Secondary | ICD-10-CM | POA: Diagnosis not present

## 2017-12-07 DIAGNOSIS — Z3009 Encounter for other general counseling and advice on contraception: Secondary | ICD-10-CM | POA: Diagnosis not present

## 2017-12-13 ENCOUNTER — Emergency Department (HOSPITAL_COMMUNITY)
Admission: EM | Admit: 2017-12-13 | Discharge: 2017-12-13 | Disposition: A | Discharge status: Home / Self Care | Payer: BLUE CROSS/BLUE SHIELD | Attending: Emergency Medicine | Admitting: Emergency Medicine

## 2017-12-13 ENCOUNTER — Encounter (HOSPITAL_COMMUNITY): Payer: Self-pay

## 2017-12-13 DIAGNOSIS — R51 Headache: Secondary | ICD-10-CM | POA: Diagnosis not present

## 2017-12-13 DIAGNOSIS — G4489 Other headache syndrome: Secondary | ICD-10-CM | POA: Diagnosis not present

## 2017-12-13 DIAGNOSIS — R079 Chest pain, unspecified: Secondary | ICD-10-CM | POA: Diagnosis not present

## 2017-12-13 DIAGNOSIS — R519 Headache, unspecified: Secondary | ICD-10-CM

## 2017-12-13 DIAGNOSIS — R110 Nausea: Secondary | ICD-10-CM | POA: Diagnosis not present

## 2017-12-13 DIAGNOSIS — Z79899 Other long term (current) drug therapy: Secondary | ICD-10-CM | POA: Diagnosis not present

## 2017-12-13 DIAGNOSIS — I10 Essential (primary) hypertension: Secondary | ICD-10-CM | POA: Diagnosis not present

## 2017-12-13 DIAGNOSIS — Z87891 Personal history of nicotine dependence: Secondary | ICD-10-CM | POA: Insufficient documentation

## 2017-12-13 LAB — BASIC METABOLIC PANEL
Anion gap: 5 (ref 5–15)
BUN: 12 mg/dL (ref 6–20)
CHLORIDE: 111 mmol/L (ref 98–111)
CO2: 21 mmol/L — ABNORMAL LOW (ref 22–32)
CREATININE: 0.94 mg/dL (ref 0.44–1.00)
Calcium: 8.4 mg/dL — ABNORMAL LOW (ref 8.9–10.3)
GFR calc non Af Amer: >60 mL/min (ref >60)
Glucose, Bld: 98 mg/dL (ref 70–99)
Potassium: 4.1 mmol/L (ref 3.5–5.1)
Sodium: 137 mmol/L (ref 135–145)

## 2017-12-13 LAB — URINALYSIS, ROUTINE W REFLEX MICROSCOPIC
Bilirubin Urine: NEGATIVE
GLUCOSE, UA: NEGATIVE mg/dL
HGB URINE DIPSTICK: NEGATIVE
Ketones, ur: NEGATIVE mg/dL
Leukocytes, UA: NEGATIVE
Nitrite: NEGATIVE
PROTEIN: NEGATIVE mg/dL
Specific Gravity, Urine: 1.002 — ABNORMAL LOW (ref 1.005–1.030)
pH: 6 (ref 5.0–8.0)

## 2017-12-13 LAB — CBC WITH DIFFERENTIAL/PLATELET
ABS IMMATURE GRANULOCYTES: 0 10*3/uL (ref 0.00–0.07)
BASOS PCT: 0 %
Basophils Absolute: 0 10*3/uL (ref 0.0–0.1)
Eosinophils Absolute: 0 10*3/uL (ref 0.0–0.5)
Eosinophils Relative: 0 %
HEMATOCRIT: 30.8 % — AB (ref 36.0–46.0)
Hemoglobin: 9.9 g/dL — ABNORMAL LOW (ref 12.0–15.0)
Immature Granulocytes: 0 %
LYMPHS ABS: 0.9 10*3/uL (ref 0.7–4.0)
Lymphocytes Relative: 39 %
MCH: 29.3 pg (ref 26.0–34.0)
MCHC: 32.1 g/dL (ref 30.0–36.0)
MCV: 91.1 fL (ref 80.0–100.0)
MONO ABS: 0.2 10*3/uL (ref 0.1–1.0)
MONOS PCT: 8 %
Neutro Abs: 1.2 10*3/uL — ABNORMAL LOW (ref 1.7–7.7)
Neutrophils Relative %: 53 %
PLATELETS: 177 10*3/uL (ref 150–400)
RBC: 3.38 MIL/uL — ABNORMAL LOW (ref 3.87–5.11)
RDW: 12.1 % (ref 11.5–15.5)
WBC: 2.3 10*3/uL — ABNORMAL LOW (ref 4.0–10.5)
nRBC: 0 % (ref 0.0–0.2)

## 2017-12-13 LAB — RAPID URINE DRUG SCREEN, HOSP PERFORMED
AMPHETAMINES: NOT DETECTED
Barbiturates: NOT DETECTED
Benzodiazepines: NOT DETECTED
Cocaine: NOT DETECTED
Opiates: NOT DETECTED
TETRAHYDROCANNABINOL: NOT DETECTED

## 2017-12-13 MED ORDER — SODIUM CHLORIDE 0.9 % IV BOLUS
1000.0000 mL | Freq: Once | INTRAVENOUS | Status: AC
  Administered 2017-12-13: 1000 mL via INTRAVENOUS

## 2017-12-13 MED ORDER — KETOROLAC TROMETHAMINE 30 MG/ML IJ SOLN
30.0000 mg | Freq: Once | INTRAMUSCULAR | Status: AC
  Administered 2017-12-13: 30 mg via INTRAVENOUS
  Filled 2017-12-13: qty 1

## 2017-12-13 MED ORDER — DIPHENHYDRAMINE HCL 50 MG/ML IJ SOLN
12.5000 mg | Freq: Once | INTRAMUSCULAR | Status: AC
  Administered 2017-12-13: 12.5 mg via INTRAVENOUS
  Filled 2017-12-13: qty 1

## 2017-12-13 MED ORDER — METOCLOPRAMIDE HCL 5 MG/ML IJ SOLN
10.0000 mg | Freq: Once | INTRAMUSCULAR | Status: AC
  Administered 2017-12-13: 10 mg via INTRAVENOUS
  Filled 2017-12-13: qty 2

## 2017-12-13 NOTE — ED Triage Notes (Addendum)
Patient BIB [facility] for headache x 3 hours while at work. Patient also c/o chest pain, nausea, and photosensitivity. Patient denies abdominal pain, dizziness, SOB, fever, or dysuria. Hx headaches, "but not migraines".  [facility] gave 4mg  Zofran without relief.   BP 156/100 HR 112 98% RA RR 18

## 2017-12-13 NOTE — Discharge Instructions (Signed)
Return if any problems.   Try Excedrin migraine for headaches

## 2017-12-13 NOTE — ED Notes (Signed)
Patient ambulated to bathroom without assistance. Steady gait noted.  

## 2017-12-13 NOTE — ED Notes (Signed)
Pt verbalized understanding of d/c instructions and has no further questions, VSS, NAD.  

## 2017-12-13 NOTE — ED Provider Notes (Signed)
[facility] EMERGENCY DEPARTMENT Provider Note   CSN: [id number] Arrival date & time: 12/13/17  0559     History   Chief Complaint Chief Complaint  Patient presents with  . Headache    HPI Billiejean Schimek is a 27 y.o. female.  The history is provided by the patient. No language interpreter was used.  Headache   This is a new problem. The current episode started 3 to 5 hours ago. The problem occurs constantly. The problem has been gradually worsening. The headache is associated with nothing. The quality of the pain is described as throbbing. The pain does not radiate. Pertinent negatives include no vomiting. She has tried nothing for the symptoms. The treatment provided no relief.    Past Medical History:  Diagnosis Date  . Anxiety   . Chest wall pain     Patient Active Problem List   Diagnosis Date Noted  . Anxiety 12/02/2017  . Chest pain 12/02/2017  . Palpitations 12/02/2017    Past Surgical History:  Procedure Laterality Date  . TONSILLECTOMY       OB History   None      Home Medications    Prior to Admission medications   Medication Sig Start Date End Date Taking? Authorizing Provider  aspirin-acetaminophen-caffeine (EXCEDRIN MIGRAINE) [phone] MG tablet Take 1 tablet by mouth every 6 (six) hours as needed for headache.   Yes [provider]  ibuprofen (ADVIL,MOTRIN) 200 MG tablet Take 200 mg by mouth every 6 (six) hours as needed for headache.   Yes [provider]  medroxyPROGESTERone (DEPO-PROVERA) 150 MG/ML injection Inject 150 mg into the muscle every 3 (three) months. 05/24/17  Yes [provider]  busPIRone (BUSPAR) 10 MG tablet Take 1 tablet (10 mg total) by mouth 3 (three) times daily. 12/01/17   Bing Neighbors, FNP  escitalopram (LEXAPRO) 10 MG tablet Take 1 tablet (10 mg total) by mouth at bedtime. 12/01/17   Bing Neighbors, FNP  metoprolol tartrate (LOPRESSOR) 25 MG tablet Take 0.5 tablets  (12.5 mg total) by mouth daily as needed. 12/01/17 12/31/17  Bing Neighbors, FNP    Family History Family History  Problem Relation Age of Onset  . Thyroid disease Mother     Social History Social History   Tobacco Use  . Smoking status: Former Games developer  . Smokeless tobacco: Never Used  Substance Use Topics  . Alcohol use: Never    Frequency: Never  . Drug use: Never     Allergies   Patient has no known allergies.   Review of Systems Review of Systems  HENT: Positive for congestion.   Gastrointestinal: Negative for vomiting.  Neurological: Positive for headaches.  All other systems reviewed and are negative.    Physical Exam Updated Vital Signs BP 104/69   Pulse 88   Temp 98 F (36.7 C) (Oral)   Resp 18   Ht 5\' 5"  (1.651 m)   Wt 53.5 kg   SpO2 100%   BMI 19.64 kg/m   Physical Exam  Constitutional: She is oriented to person, place, and time. She appears well-developed and well-nourished.  HENT:  Head: Normocephalic.  Mouth/Throat: Oropharynx is clear and moist.  Eyes: Pupils are equal, round, and reactive to light. EOM are normal.  Neck: Normal range of motion.  Pulmonary/Chest: Effort normal.  Abdominal: She exhibits no distension.  Musculoskeletal: Normal range of motion.  Neurological: She is alert and oriented to person, place, and time. She has normal strength.  She displays a negative Romberg sign.  Skin: Skin is warm.  Psychiatric: She has a normal mood and affect.  Nursing note and vitals reviewed.    ED Treatments / Results  Labs (all labs ordered are listed, but only abnormal results are displayed) Labs Reviewed  CBC WITH DIFFERENTIAL/PLATELET - Abnormal; Notable for the following components:      Result Value   WBC 2.3 (*)    RBC 3.38 (*)    Hemoglobin 9.9 (*)    HCT 30.8 (*)    Neutro Abs 1.2 (*)    All other components within normal limits  URINALYSIS, ROUTINE W REFLEX MICROSCOPIC - Abnormal; Notable for the following components:    Color, Urine COLORLESS (*)    Specific Gravity, Urine 1.002 (*)    All other components within normal limits  BASIC METABOLIC PANEL - Abnormal; Notable for the following components:   CO2 21 (*)    Calcium 8.4 (*)    All other components within normal limits  RAPID URINE DRUG SCREEN, HOSP PERFORMED    EKG EKG Interpretation  Date/Time:  Monday December 13 2017 06:53:52 EDT Ventricular Rate:  75 PR Interval:    QRS Duration: 80 QT Interval:  372 QTC Calculation: 416 R Axis:   76 Text Interpretation:  Normal sinus rhythm no acute ST/T changes no significant change since Nov 30 2017 Confirmed by Pricilla Loveless [id number]) on 12/13/2017 6:57:05 AM Also confirmed by Pricilla Loveless [phone]), editor Elita Quick ([id number])  on 12/13/2017 7:29:37 AM   Radiology No results found.  Procedures Procedures (including critical care time)  Medications Ordered in ED Medications  sodium chloride 0.9 % bolus 1,000 mL (1,000 mLs Intravenous Bolus 12/13/17 0724)  metoCLOPramide (REGLAN) injection 10 mg (10 mg Intravenous Given 12/13/17 0725)  ketorolac (TORADOL) 30 MG/ML injection 30 mg (30 mg Intravenous Given 12/13/17 0728)  diphenhydrAMINE (BENADRYL) injection 12.5 mg (12.5 mg Intravenous Given 12/13/17 0725)     Initial Impression / Assessment and Plan / ED Course  I have reviewed the triage vital signs and the nursing notes.  Pertinent labs & imaging results that were available during my care of the patient were reviewed by me and considered in my medical decision making (see chart for details).     MDM  Pt's labs are normal.  Pt feels better after migraine cocktail.   Pt advised to return if any problems.    Final Clinical Impressions(s) / ED Diagnoses   Final diagnoses:  Bad headache    ED Discharge Orders    None    An After Visit Summary was printed and given to the patient.    Elson Areas, PA-C 12/13/17 1010    Pricilla Loveless, MD 12/13/17 [phone]

## 2017-12-16 ENCOUNTER — Encounter (HOSPITAL_COMMUNITY): Payer: Self-pay | Admitting: Emergency Medicine

## 2017-12-16 ENCOUNTER — Emergency Department (HOSPITAL_COMMUNITY)
Admission: EM | Admit: 2017-12-16 | Discharge: 2017-12-16 | Disposition: A | Discharge status: Home / Self Care | Payer: BLUE CROSS/BLUE SHIELD | Attending: Emergency Medicine | Admitting: Emergency Medicine

## 2017-12-16 DIAGNOSIS — Z79899 Other long term (current) drug therapy: Secondary | ICD-10-CM | POA: Diagnosis not present

## 2017-12-16 DIAGNOSIS — R000 Tachycardia, unspecified: Secondary | ICD-10-CM | POA: Diagnosis not present

## 2017-12-16 DIAGNOSIS — G44209 Tension-type headache, unspecified, not intractable: Secondary | ICD-10-CM

## 2017-12-16 DIAGNOSIS — Z87891 Personal history of nicotine dependence: Secondary | ICD-10-CM | POA: Diagnosis not present

## 2017-12-16 DIAGNOSIS — R110 Nausea: Secondary | ICD-10-CM | POA: Diagnosis not present

## 2017-12-16 LAB — CBC
HCT: 33.9 % — ABNORMAL LOW (ref 36.0–46.0)
HEMOGLOBIN: 11.5 g/dL — AB (ref 12.0–15.0)
MCH: 29.8 pg (ref 26.0–34.0)
MCHC: 33.9 g/dL (ref 30.0–36.0)
MCV: 87.8 fL (ref 80.0–100.0)
NRBC: 0 % (ref 0.0–0.2)
PLATELETS: 204 10*3/uL (ref 150–400)
RBC: 3.86 MIL/uL — AB (ref 3.87–5.11)
RDW: 11.8 % (ref 11.5–15.5)
WBC: 2.1 10*3/uL — AB (ref 4.0–10.5)

## 2017-12-16 LAB — COMPREHENSIVE METABOLIC PANEL
ALT: 21 U/L (ref 0–44)
ANION GAP: 6 (ref 5–15)
AST: 19 U/L (ref 15–41)
Albumin: 3.9 g/dL (ref 3.5–5.0)
Alkaline Phosphatase: 34 U/L — ABNORMAL LOW (ref 38–126)
BUN: 6 mg/dL (ref 6–20)
CHLORIDE: 107 mmol/L (ref 98–111)
CO2: 24 mmol/L (ref 22–32)
Calcium: 9.2 mg/dL (ref 8.9–10.3)
Creatinine, Ser: 0.84 mg/dL (ref 0.44–1.00)
GFR calc Af Amer: >60 mL/min (ref >60)
Glucose, Bld: 108 mg/dL — ABNORMAL HIGH (ref 70–99)
POTASSIUM: 3.5 mmol/L (ref 3.5–5.1)
SODIUM: 137 mmol/L (ref 135–145)
Total Bilirubin: 0.5 mg/dL (ref 0.3–1.2)
Total Protein: 7.9 g/dL (ref 6.5–8.1)

## 2017-12-16 LAB — URINALYSIS, ROUTINE W REFLEX MICROSCOPIC
Bilirubin Urine: NEGATIVE
GLUCOSE, UA: NEGATIVE mg/dL
HGB URINE DIPSTICK: NEGATIVE
KETONES UR: NEGATIVE mg/dL
NITRITE: NEGATIVE
PROTEIN: NEGATIVE mg/dL
Specific Gravity, Urine: 1.005 (ref 1.005–1.030)
pH: 7 (ref 5.0–8.0)

## 2017-12-16 LAB — I-STAT BETA HCG BLOOD, ED (MC, WL, AP ONLY): I-stat hCG, quantitative: <5 m[IU]/mL (ref <5)

## 2017-12-16 MED ORDER — KETOROLAC TROMETHAMINE 15 MG/ML IJ SOLN
15.0000 mg | Freq: Once | INTRAMUSCULAR | Status: AC
  Administered 2017-12-16: 15 mg via INTRAVENOUS
  Filled 2017-12-16: qty 1

## 2017-12-16 MED ORDER — METOCLOPRAMIDE HCL 5 MG/ML IJ SOLN
10.0000 mg | Freq: Once | INTRAMUSCULAR | Status: AC
  Administered 2017-12-16: 10 mg via INTRAVENOUS
  Filled 2017-12-16: qty 2

## 2017-12-16 MED ORDER — SODIUM CHLORIDE 0.9 % IV BOLUS
1000.0000 mL | Freq: Once | INTRAVENOUS | Status: AC
  Administered 2017-12-16: 1000 mL via INTRAVENOUS

## 2017-12-16 MED ORDER — DIPHENHYDRAMINE HCL 50 MG/ML IJ SOLN
25.0000 mg | Freq: Once | INTRAMUSCULAR | Status: AC
  Administered 2017-12-16: 25 mg via INTRAVENOUS
  Filled 2017-12-16: qty 1

## 2017-12-16 NOTE — ED Provider Notes (Signed)
[facility] EMERGENCY DEPARTMENT Provider Note   CSN: [id number] Arrival date & time: 12/16/17  0228     History   Chief Complaint Chief Complaint  Patient presents with  . Nausea  . Headache    HPI Faith Ross is a 27 y.o. female.  Patient with past medical history remarkable for tension headaches presents to the emergency department with a chief complaint of headache.  She states that she was seen here recently for the same.  She was instructed to get Excedrin Migraine.  She denies any relief of her symptoms with the Excedrin Migraine.  She has seen a neurologist and a chiropractor in the past for the same.  She states that nothing helps.  She describes the headache as a squeezing sensation around her temples and does have pain in the occipital scalp and upper and neck and back.  She denies any fevers chills.  Denies any weakness of her extremities.  Denies any vision changes or slurred speech.  She describes this headache is similar to her prior headaches.  The history is provided by the patient. No language interpreter was used.    Past Medical History:  Diagnosis Date  . Anxiety   . Chest wall pain     Patient Active Problem List   Diagnosis Date Noted  . Anxiety 12/02/2017  . Chest pain 12/02/2017  . Palpitations 12/02/2017    Past Surgical History:  Procedure Laterality Date  . TONSILLECTOMY       OB History   None      Home Medications    Prior to Admission medications   Medication Sig Start Date End Date Taking? Authorizing Provider  aspirin-acetaminophen-caffeine (EXCEDRIN MIGRAINE) [phone] MG tablet Take 1 tablet by mouth every 6 (six) hours as needed for headache.    [provider]  busPIRone (BUSPAR) 10 MG tablet Take 1 tablet (10 mg total) by mouth 3 (three) times daily. 12/01/17   Bing Neighbors, FNP  escitalopram (LEXAPRO) 10 MG tablet Take 1 tablet (10 mg total) by mouth at bedtime. 12/01/17   Bing Neighbors, FNP  ibuprofen (ADVIL,MOTRIN) 200 MG tablet Take 200 mg by mouth every 6 (six) hours as needed for headache.    [provider]  medroxyPROGESTERone (DEPO-PROVERA) 150 MG/ML injection Inject 150 mg into the muscle every 3 (three) months. 05/24/17   [provider]  metoprolol tartrate (LOPRESSOR) 25 MG tablet Take 0.5 tablets (12.5 mg total) by mouth daily as needed. 12/01/17 12/31/17  Bing Neighbors, FNP    Family History Family History  Problem Relation Age of Onset  . Thyroid disease Mother     Social History Social History   Tobacco Use  . Smoking status: Former Games developer  . Smokeless tobacco: Never Used  Substance Use Topics  . Alcohol use: Never    Frequency: Never  . Drug use: Never     Allergies   Patient has no known allergies.   Review of Systems Review of Systems  All other systems reviewed and are negative.    Physical Exam Updated Vital Signs BP 112/75 (BP Location: Right Arm)   Pulse 72   Temp 98.7 F (37.1 C) (Oral)   Resp 16   SpO2 100%   Physical Exam  Constitutional: She is oriented to person, place, and time. She appears well-developed and well-nourished.  HENT:  Head: Normocephalic and atraumatic.  Right Ear: External ear normal.  Left Ear: External ear normal.  Eyes: Pupils  are equal, round, and reactive to light. Conjunctivae and EOM are normal.  Neck: Normal range of motion. Neck supple.  No pain with neck flexion, no meningismus  Cardiovascular: Normal rate, regular rhythm and normal heart sounds. Exam reveals no gallop and no friction rub.  No murmur heard. Pulmonary/Chest: Effort normal and breath sounds normal. No respiratory distress. She has no wheezes. She has no rales. She exhibits no tenderness.  Abdominal: Soft. She exhibits no distension and no mass. There is no tenderness. There is no rebound and no guarding.  Musculoskeletal: Normal range of motion. She exhibits no edema or tenderness.  Normal  gait.  Neurological: She is alert and oriented to person, place, and time. She has normal reflexes.  CN 3-12 intact, sensation and strength intact bilaterally.  Skin: Skin is warm and dry.  Psychiatric: She has a normal mood and affect. Her behavior is normal. Judgment and thought content normal.  Nursing note and vitals reviewed.    ED Treatments / Results  Labs (all labs ordered are listed, but only abnormal results are displayed) Labs Reviewed  COMPREHENSIVE METABOLIC PANEL - Abnormal; Notable for the following components:      Result Value   Glucose, Bld 108 (*)    Alkaline Phosphatase 34 (*)    All other components within normal limits  CBC - Abnormal; Notable for the following components:   WBC 2.1 (*)    RBC 3.86 (*)    Hemoglobin 11.5 (*)    HCT 33.9 (*)    All other components within normal limits  URINALYSIS, ROUTINE W REFLEX MICROSCOPIC - Abnormal; Notable for the following components:   Color, Urine STRAW (*)    APPearance HAZY (*)    Leukocytes, UA LARGE (*)    Bacteria, UA RARE (*)    All other components within normal limits  I-STAT BETA HCG BLOOD, ED ([facility], [facility], [facility] ONLY)    EKG None  Radiology No results found.  Procedures Procedures (including critical care time)  Medications Ordered in ED Medications  ketorolac (TORADOL) 15 MG/ML injection 15 mg (has no administration in time range)  metoCLOPramide (REGLAN) injection 10 mg (has no administration in time range)  diphenhydrAMINE (BENADRYL) injection 25 mg (has no administration in time range)     Initial Impression / Assessment and Plan / ED Course  I have reviewed the triage vital signs and the nursing notes.  Pertinent labs & imaging results that were available during my care of the patient were reviewed by me and considered in my medical decision making (see chart for details).     Patient with headache.  Has seen neurology and a chiropractor in the past for tension headaches.  She does have  muscular tenderness of her upper trapezius and some tenderness in the occipital region.  I do believe that she has tension headaches.  I have a low suspicion for any intracranial process, she is neurovascularly intact.  The symptoms have been ongoing for quite a long time.  I will give the patient a headache cocktail and discharge.  Recommend sports medicine, massage therapy, physical therapy, and PCP follow-up.  Final Clinical Impressions(s) / ED Diagnoses   Final diagnoses:  Tension headache    ED Discharge Orders    None       Roxy Horseman, PA-C 12/16/17 0435    Ward, Layla Maw, DO 12/16/17 [phone]

## 2017-12-16 NOTE — Discharge Instructions (Signed)
You may also benefit from physical therapy and massage therapy.    Please return for fever, vision loss, or paralysis, or any other symptoms that concern you.

## 2017-12-16 NOTE — ED Notes (Signed)
Ree Shay PA notified on pt.'s tachycardia /palpitations with nausea after receiving IV medications .

## 2017-12-16 NOTE — ED Triage Notes (Signed)
Patient reports nausea and headache onset this week , denies fever or chills .

## 2017-12-22 ENCOUNTER — Encounter (HOSPITAL_COMMUNITY): Payer: Self-pay

## 2017-12-22 ENCOUNTER — Other Ambulatory Visit: Payer: Self-pay

## 2017-12-22 ENCOUNTER — Emergency Department (HOSPITAL_COMMUNITY)
Admission: EM | Admit: 2017-12-22 | Discharge: 2017-12-23 | Disposition: A | Discharge status: Home / Self Care | Payer: BLUE CROSS/BLUE SHIELD | Attending: Emergency Medicine | Admitting: Emergency Medicine

## 2017-12-22 DIAGNOSIS — R1110 Vomiting, unspecified: Secondary | ICD-10-CM | POA: Diagnosis not present

## 2017-12-22 DIAGNOSIS — K529 Noninfective gastroenteritis and colitis, unspecified: Secondary | ICD-10-CM | POA: Insufficient documentation

## 2017-12-22 DIAGNOSIS — F419 Anxiety disorder, unspecified: Secondary | ICD-10-CM | POA: Diagnosis not present

## 2017-12-22 DIAGNOSIS — Z87891 Personal history of nicotine dependence: Secondary | ICD-10-CM | POA: Insufficient documentation

## 2017-12-22 DIAGNOSIS — Z79899 Other long term (current) drug therapy: Secondary | ICD-10-CM | POA: Diagnosis not present

## 2017-12-22 LAB — CBC
HCT: 33.9 % — ABNORMAL LOW (ref 36.0–46.0)
Hemoglobin: 11.4 g/dL — ABNORMAL LOW (ref 12.0–15.0)
MCH: 29.9 pg (ref 26.0–34.0)
MCHC: 33.6 g/dL (ref 30.0–36.0)
MCV: 89 fL (ref 80.0–100.0)
Platelets: 210 10*3/uL (ref 150–400)
RBC: 3.81 MIL/uL — ABNORMAL LOW (ref 3.87–5.11)
RDW: 12.3 % (ref 11.5–15.5)
WBC: 2.3 10*3/uL — AB (ref 4.0–10.5)
nRBC: 0 % (ref 0.0–0.2)

## 2017-12-22 LAB — I-STAT BETA HCG BLOOD, ED (MC, WL, AP ONLY): I-stat hCG, quantitative: <5 m[IU]/mL (ref <5)

## 2017-12-22 MED ORDER — SODIUM CHLORIDE 0.9 % IV BOLUS
1000.0000 mL | Freq: Once | INTRAVENOUS | Status: AC
  Administered 2017-12-22: 1000 mL via INTRAVENOUS

## 2017-12-22 MED ORDER — ONDANSETRON HCL 4 MG/2ML IJ SOLN
4.0000 mg | Freq: Once | INTRAMUSCULAR | Status: AC
  Administered 2017-12-22: 4 mg via INTRAVENOUS
  Filled 2017-12-22: qty 2

## 2017-12-22 NOTE — ED Triage Notes (Signed)
Pt reports nausea and diarrhea since 7p after she ate [employer]. Also endorses epigastric pain. A&Ox4.

## 2017-12-22 NOTE — ED Provider Notes (Addendum)
[facility]-EMERGENCY DEPT Provider Note: [name], MD, FACEP  CSN: [id number] MRN: [id number] ARRIVAL: 12/22/17 at 2156 ROOM: WA16/WA16   CHIEF COMPLAINT  Vomiting   HISTORY OF PRESENT ILLNESS  12/22/17 11:01 PM Faith Ross is a 27 y.o. female with nausea, vomiting and diarrhea since about 7 PM after eating at [employer].  She is also having some epigastric cramping which she has difficulty describing but rates it about a 6 out of 10.  She has had some lightheadedness as well.    Past Medical History:  Diagnosis Date  . Anxiety   . Chest wall pain     Past Surgical History:  Procedure Laterality Date  . TONSILLECTOMY      Family History  Problem Relation Age of Onset  . Thyroid disease Mother     Social History   Tobacco Use  . Smoking status: Former Games developer  . Smokeless tobacco: Never Used  Substance Use Topics  . Alcohol use: Never    Frequency: Never  . Drug use: Never    Prior to Admission medications   Medication Sig Start Date End Date Taking? Authorizing Provider  aspirin-acetaminophen-caffeine (EXCEDRIN MIGRAINE) [phone] MG tablet Take 1 tablet by mouth every 6 (six) hours as needed for headache.   Yes [provider]  busPIRone (BUSPAR) 10 MG tablet Take 1 tablet (10 mg total) by mouth 3 (three) times daily. 12/01/17  Yes Bing Neighbors, FNP  ibuprofen (ADVIL,MOTRIN) 200 MG tablet Take 200 mg by mouth every 6 (six) hours as needed for headache.   Yes [provider]  medroxyPROGESTERone (DEPO-PROVERA) 150 MG/ML injection Inject 150 mg into the muscle every 3 (three) months. 05/24/17  Yes [provider]  escitalopram (LEXAPRO) 10 MG tablet Take 1 tablet (10 mg total) by mouth at bedtime. 12/01/17   Bing Neighbors, FNP  metoprolol tartrate (LOPRESSOR) 25 MG tablet Take 0.5 tablets (12.5 mg total) by mouth daily as needed. 12/01/17 12/31/17  Bing Neighbors, FNP    Allergies Patient has no known allergies.   REVIEW  OF SYSTEMS  Negative except as noted here or in the History of Present Illness.   PHYSICAL EXAMINATION  Initial Vital Signs Blood pressure 120/80, pulse 87, temperature 98.4 F (36.9 C), temperature source Oral, resp. rate 16, SpO2 100 %.  Examination General: Well-developed, well-nourished female in no acute distress; appearance consistent with age of record HENT: normocephalic; atraumatic Eyes: pupils equal, round and reactive to light; extraocular muscles intact Neck: supple Heart: regular rate and rhythm Lungs: clear to auscultation bilaterally Abdomen: soft; nondistended; nontender; no masses or hepatosplenomegaly; bowel sounds present Extremities: No deformity; full range of motion; pulses normal Neurologic: Awake, alert and oriented; motor function intact in all extremities and symmetric; no facial droop Skin: Warm and dry Psychiatric: Flat affect   RESULTS  Summary of this visit's results, reviewed by myself:   EKG Interpretation  Date/Time:    Ventricular Rate:    PR Interval:    QRS Duration:   QT Interval:    QTC Calculation:   R Axis:     Text Interpretation:        Laboratory Studies: Results for orders placed or performed during the hospital encounter of 12/22/17 (from the past 24 hour(s))  Lipase, blood     Status: None   Collection Time: 12/22/17 11:10 PM  Result Value Ref Range   Lipase 48 11 - 51 U/L  Comprehensive metabolic panel     Status: Abnormal  Collection Time: 12/22/17 11:10 PM  Result Value Ref Range   Sodium 138 135 - 145 mmol/L   Potassium 3.5 3.5 - 5.1 mmol/L   Chloride 109 98 - 111 mmol/L   CO2 24 22 - 32 mmol/L   Glucose, Bld 119 (H) 70 - 99 mg/dL   BUN 9 6 - 20 mg/dL   Creatinine, Ser 6.04 0.44 - 1.00 mg/dL   Calcium 8.8 (L) 8.9 - 10.3 mg/dL   Total Protein 8.4 (H) 6.5 - 8.1 g/dL   Albumin 4.0 3.5 - 5.0 g/dL   AST 19 15 - 41 U/L   ALT 23 0 - 44 U/L   Alkaline Phosphatase 38 38 - 126 U/L   Total Bilirubin 0.3 0.3 - 1.2  mg/dL   GFR calc non Af Amer >60 >60 mL/min   GFR calc Af Amer >60 >60 mL/min   Anion gap 5 5 - 15  CBC     Status: Abnormal   Collection Time: 12/22/17 11:10 PM  Result Value Ref Range   WBC 2.3 (L) 4.0 - 10.5 K/uL   RBC 3.81 (L) 3.87 - 5.11 MIL/uL   Hemoglobin 11.4 (L) 12.0 - 15.0 g/dL   HCT 54.0 (L) 98.1 - 19.1 %   MCV 89.0 80.0 - 100.0 fL   MCH 29.9 26.0 - 34.0 pg   MCHC 33.6 30.0 - 36.0 g/dL   RDW 47.8 29.5 - 62.1 %   Platelets 210 150 - 400 K/uL   nRBC 0.0 0.0 - 0.2 %  Urinalysis, Routine w reflex microscopic     Status: Abnormal   Collection Time: 12/22/17 11:10 PM  Result Value Ref Range   Color, Urine YELLOW YELLOW   APPearance CLEAR CLEAR   Specific Gravity, Urine 1.006 1.005 - 1.030   pH 6.0 5.0 - 8.0   Glucose, UA NEGATIVE NEGATIVE mg/dL   Hgb urine dipstick SMALL (A) NEGATIVE   Bilirubin Urine NEGATIVE NEGATIVE   Ketones, ur NEGATIVE NEGATIVE mg/dL   Protein, ur NEGATIVE NEGATIVE mg/dL   Nitrite NEGATIVE NEGATIVE   Leukocytes, UA NEGATIVE NEGATIVE   RBC / HPF 0-5 0 - 5 RBC/hpf   WBC, UA 0-5 0 - 5 WBC/hpf   Bacteria, UA NONE SEEN NONE SEEN   Squamous Epithelial / LPF 0-5 0 - 5  I-Stat beta hCG blood, ED     Status: None   Collection Time: 12/22/17 11:22 PM  Result Value Ref Range   I-stat hCG, quantitative <5.0 <5 mIU/mL   Comment 3           Imaging Studies: No results found.  ED COURSE and MDM  Nursing notes and initial vitals signs, including pulse oximetry, reviewed.  Vitals:   12/22/17 2217 12/22/17 2314 12/22/17 2315  BP: 120/80 137/77   Pulse: 87 94 94  Resp: 16 15   Temp: 98.4 F (36.9 C)    TempSrc: Oral    SpO2: 100% 100% 100%   12:38 AM Patient feeling better after IV fluids and medications.  Drinking fluids without emesis.  She is noted to have a low white count but this is consistent with previous levels.  1:19 AM This patient was being discharged she developed chest discomfort.  She describes the chest discomfort as well localized  below her left breast and worse with movement or palpation.  It is accompanied by a sensation of rapid heartbeat and hyperventilation.  She has a history of anxiety and hyperventilation in the past.  She has rib  tenderness below the left breast and is tachycardic on exam.  She appears anxious.  Ativan 1 mg sublingually ordered.  PROCEDURES    ED DIAGNOSES     ICD-10-CM   1. Gastroenteritis K52.9        Stephonie Wilcoxen, MD 12/23/17 0865    Paula Libra, MD 12/23/17 7846

## 2017-12-23 LAB — URINALYSIS, ROUTINE W REFLEX MICROSCOPIC
Bacteria, UA: NONE SEEN
Bilirubin Urine: NEGATIVE
Glucose, UA: NEGATIVE mg/dL
Ketones, ur: NEGATIVE mg/dL
Leukocytes, UA: NEGATIVE
Nitrite: NEGATIVE
Protein, ur: NEGATIVE mg/dL
SPECIFIC GRAVITY, URINE: 1.006 (ref 1.005–1.030)
pH: 6 (ref 5.0–8.0)

## 2017-12-23 LAB — COMPREHENSIVE METABOLIC PANEL
ALK PHOS: 38 U/L (ref 38–126)
ALT: 23 U/L (ref 0–44)
AST: 19 U/L (ref 15–41)
Albumin: 4 g/dL (ref 3.5–5.0)
Anion gap: 5 (ref 5–15)
BUN: 9 mg/dL (ref 6–20)
CALCIUM: 8.8 mg/dL — AB (ref 8.9–10.3)
CHLORIDE: 109 mmol/L (ref 98–111)
CO2: 24 mmol/L (ref 22–32)
CREATININE: 0.86 mg/dL (ref 0.44–1.00)
GFR calc Af Amer: >60 mL/min (ref >60)
Glucose, Bld: 119 mg/dL — ABNORMAL HIGH (ref 70–99)
Potassium: 3.5 mmol/L (ref 3.5–5.1)
Sodium: 138 mmol/L (ref 135–145)
Total Bilirubin: 0.3 mg/dL (ref 0.3–1.2)
Total Protein: 8.4 g/dL — ABNORMAL HIGH (ref 6.5–8.1)

## 2017-12-23 LAB — LIPASE, BLOOD: LIPASE: 48 U/L (ref 11–51)

## 2017-12-23 MED ORDER — ONDANSETRON 8 MG PO TBDP: 8.0000 mg | ORAL_TABLET | Freq: Three times a day (TID) | ORAL | 0 refills | Status: DC | PRN

## 2017-12-23 MED ORDER — LORAZEPAM 1 MG PO TABS
1.0000 mg | ORAL_TABLET | Freq: Once | ORAL | Status: AC
  Administered 2017-12-23: 1 mg via SUBLINGUAL
  Filled 2017-12-23: qty 1

## 2017-12-23 MED ORDER — KETOROLAC TROMETHAMINE 15 MG/ML IJ SOLN
15.0000 mg | Freq: Once | INTRAMUSCULAR | Status: DC
  Filled 2017-12-23: qty 1

## 2017-12-23 NOTE — ED Notes (Signed)
Pt walked out without waiting for discharge paperwork, states she feels fine now and is going to go home, pt refused last set of vitals.

## 2017-12-23 NOTE — ED Notes (Signed)
Pt returned to the ED stating that when she walked to her car she felt like her heart was racing, pulse WDL

## 2017-12-29 ENCOUNTER — Ambulatory Visit: Payer: BLUE CROSS/BLUE SHIELD | Admitting: Family Medicine

## 2018-02-20 ENCOUNTER — Encounter (HOSPITAL_COMMUNITY): Payer: Self-pay | Admitting: Emergency Medicine

## 2018-02-20 ENCOUNTER — Emergency Department (HOSPITAL_COMMUNITY)
Admission: EM | Admit: 2018-02-20 | Discharge: 2018-02-21 | Disposition: A | Discharge status: Home / Self Care | Payer: BLUE CROSS/BLUE SHIELD | Attending: Emergency Medicine | Admitting: Emergency Medicine

## 2018-02-20 ENCOUNTER — Other Ambulatory Visit: Payer: Self-pay

## 2018-02-20 DIAGNOSIS — Z7982 Long term (current) use of aspirin: Secondary | ICD-10-CM | POA: Insufficient documentation

## 2018-02-20 DIAGNOSIS — R6889 Other general symptoms and signs: Secondary | ICD-10-CM | POA: Diagnosis not present

## 2018-02-20 DIAGNOSIS — H5789 Other specified disorders of eye and adnexa: Secondary | ICD-10-CM

## 2018-02-20 DIAGNOSIS — Z79899 Other long term (current) drug therapy: Secondary | ICD-10-CM | POA: Insufficient documentation

## 2018-02-20 DIAGNOSIS — R000 Tachycardia, unspecified: Secondary | ICD-10-CM | POA: Diagnosis not present

## 2018-02-20 MED ORDER — ERYTHROMYCIN 5 MG/GM OP OINT: TOPICAL_OINTMENT | OPHTHALMIC | 0 refills | Status: DC

## 2018-02-20 MED ORDER — TETRACAINE HCL 0.5 % OP SOLN
1.0000 [drp] | Freq: Once | OPHTHALMIC | Status: AC
  Administered 2018-02-20: 1 [drp] via OPHTHALMIC

## 2018-02-20 MED ORDER — FLUORESCEIN SODIUM 1 MG OP STRP
1.0000 | ORAL_STRIP | Freq: Once | OPHTHALMIC | Status: AC
  Administered 2018-02-20: 1 via OPHTHALMIC

## 2018-02-20 NOTE — Discharge Instructions (Addendum)
Use antibiotic ointment as directed.  Follow-up with your primary care doctor.  If your symptoms do not improve in 2 weeks, follow-up with referred ophthalmology office for further evaluation.  Return emergency department for any worsening pain, vision changes, fever or any other worsening or concerning symptoms.

## 2018-02-20 NOTE — ED Triage Notes (Signed)
Patient is complaining of right eye irritation. Right eye is reddened. Patient states it has been irritated all week.

## 2018-02-20 NOTE — ED Provider Notes (Signed)
[facility]-EMERGENCY DEPT Provider Note   CSN: [id number] Arrival date & time: 02/20/18  2002     History   Chief Complaint Chief Complaint  Patient presents with  . Eye Problem    HPI Faith Ross is a 27 y.o. female who presents for evaluation of right eye irritation has been ongoing for last week.  Patient reports that about a week ago, she noticed that the eye felt irritated and itchy.  She states she noticed it was slightly red but did not pay much attention to it.  She reports over the last week, she is continued to have irritation to the eye.  Additionally, she states that a few days ago, it started irritating the left eye.  She denies any preceding trauma, injury, fall.  Patient states she has been using over-the-counter eyedrop with no improvement.  Patient denies any blurry vision.  Patient states she has not wear glasses or contacts.  Patient states that she has not had any discharge from the eye.  Patient denies any fevers.  The history is provided by the patient.    Past Medical History:  Diagnosis Date  . Anxiety   . Chest wall pain     Patient Active Problem List   Diagnosis Date Noted  . Anxiety 12/02/2017  . Chest pain 12/02/2017  . Palpitations 12/02/2017    Past Surgical History:  Procedure Laterality Date  . TONSILLECTOMY       OB History   No obstetric history on file.      Home Medications    Prior to Admission medications   Medication Sig Start Date End Date Taking? Authorizing Provider  aspirin-acetaminophen-caffeine (EXCEDRIN MIGRAINE) [phone] MG tablet Take 1 tablet by mouth every 6 (six) hours as needed for headache.    [provider]  busPIRone (BUSPAR) 10 MG tablet Take 1 tablet (10 mg total) by mouth 3 (three) times daily. 12/01/17   Bing Neighbors, FNP  erythromycin ophthalmic ointment Place a 1/2 inch ribbon of ointment into the lower eyelid 3 times a day x7 days 02/20/18   Graciella Freer A,  PA-C  escitalopram (LEXAPRO) 10 MG tablet Take 1 tablet (10 mg total) by mouth at bedtime. 12/01/17   Bing Neighbors, FNP  ibuprofen (ADVIL,MOTRIN) 200 MG tablet Take 200 mg by mouth every 6 (six) hours as needed for headache.    [provider]  medroxyPROGESTERone (DEPO-PROVERA) 150 MG/ML injection Inject 150 mg into the muscle every 3 (three) months. 05/24/17   [provider]  metoprolol tartrate (LOPRESSOR) 25 MG tablet Take 0.5 tablets (12.5 mg total) by mouth daily as needed. 12/01/17 12/31/17  Bing Neighbors, FNP  ondansetron (ZOFRAN ODT) 8 MG disintegrating tablet Take 1 tablet (8 mg total) by mouth every 8 (eight) hours as needed for nausea or vomiting. 12/23/17   Molpus, John, MD    Family History Family History  Problem Relation Age of Onset  . Thyroid disease Mother     Social History Social History   Tobacco Use  . Smoking status: Former Games developer  . Smokeless tobacco: Never Used  Substance Use Topics  . Alcohol use: Never    Frequency: Never  . Drug use: Never     Allergies   Patient has no known allergies.   Review of Systems Review of Systems  Constitutional: Negative for fever.  Eyes: Positive for redness and itching. Negative for photophobia, pain, discharge and visual disturbance.  All other systems reviewed and  are negative.    Physical Exam Updated Vital Signs BP 123/76 (BP Location: Right Arm)   Pulse 79   Temp 98.7 F (37.1 C) (Oral)   Resp 13   Ht 5\' 5"  (1.651 m)   Wt 52.2 kg   SpO2 100%   BMI 19.14 kg/m   Physical Exam Vitals signs and nursing note reviewed.  Constitutional:      Appearance: She is well-developed.  HENT:     Head: Normocephalic and atraumatic.  Eyes:     General: Lids are everted, no foreign bodies appreciated. Vision grossly intact. No scleral icterus.       Right eye: No discharge.        Left eye: No discharge.     Extraocular Movements:     Right eye: No nystagmus.     Left eye: No  nystagmus.     Conjunctiva/sclera:     Right eye: Right conjunctiva is injected.     Pupils: Pupils are equal, round, and reactive to light.     Comments: EOMs intact without any difficulty. PERRL.   Pulmonary:     Effort: Pulmonary effort is normal.  Skin:    General: Skin is warm and dry.  Neurological:     Mental Status: She is alert.  Psychiatric:        Speech: Speech normal.        Behavior: Behavior normal.      ED Treatments / Results  Labs (all labs ordered are listed, but only abnormal results are displayed) Labs Reviewed - No data to display  EKG None  Radiology No results found.  Procedures Procedures (including critical care time)  Medications Ordered in ED Medications  tetracaine (PONTOCAINE) 0.5 % ophthalmic solution 1 drop (1 drop Both Eyes Given 02/20/18 2056)  fluorescein ophthalmic strip 1 strip (1 strip Both Eyes Given 02/20/18 2055)     Initial Impression / Assessment and Plan / ED Course  I have reviewed the triage vital signs and the nursing notes.  Pertinent labs & imaging results that were available during my care of the patient were reviewed by me and considered in my medical decision making (see chart for details).     27 year old female who presents for evaluation of eye irritation x1 week.  Reports that possibly it is in the right eye but states her last 3 days, spread to the left eye.  She also reports that she feels like the right eye is red.  No vision change.  No preceding trauma, injury, fall.  Patient states she does not wear glasses or contacts.  No fevers.  On exam, she has some slight conjunctival injection but otherwise unremarkable exam. Patient is afebrile, non-toxic appearing, sitting comfortably on examination table. Vital signs reviewed and stable.  Consider conjunctivitis.  Low suspicion for corneal abrasion but also consideration.  History/physical exam not concerning for glaucoma, preseptal cellulitis, orbital  cellulitis.  With lamp evaluation showed no evidence of floor seen uptake, evidence of corneal abrasion.  Negative Seidel sign.   Intraocular pressure as documented below:  Left IOP:  12, 8 Right IOP: 15, 17  Will plan to treat as conjunctivitis given duration of symptoms and conjunctival injection.  Patient instructed on at home supportive care measures. Patient had ample opportunity for questions and discussion. All patient's questions were answered with full understanding. Strict return precautions discussed. Patient expresses understanding and agreement to plan.    Was set for discharge and came out of the room  saying that her heart rate was high.  I went evaluated patient and she did have sinus tachycardia with heart rate into the 130s.  Patient was having hyperventilating and seemed very anxious.  She does have a history of panic attacks.  Suspect the patient was having a panic attack.  EKG was ordered and showed sinus tachycardia.  Attempted to calm patient down with breathing exercises.  We will plan to observe patient.  Reevaluation.  Patient's heart rate has improved into the 115's.  She is still hyperventilating very anxious.  I did offer something for anxiety but patient declined.  Discussed with patient that we would watch her and oncoming PA would reevaluate her for final discharge.   Final Clinical Impressions(s) / ED Diagnoses   Final diagnoses:  Eye irritation    ED Discharge Orders         Ordered    erythromycin ophthalmic ointment     02/20/18 2124           Maxwell CaulLayden, Baine Decesare A, PA-C 02/21/18 1839    Charlynne PanderYao, David Hsienta, MD 02/22/18 2306

## 2018-02-21 NOTE — ED Notes (Addendum)
Patient is not in the room and has not came back in an hour. Patient was suppose to be waiting for her anxiety attack to calm down.

## 2018-02-28 ENCOUNTER — Ambulatory Visit: Payer: BLUE CROSS/BLUE SHIELD | Admitting: Family Medicine

## 2018-03-01 ENCOUNTER — Encounter (HOSPITAL_COMMUNITY): Payer: Self-pay

## 2018-03-01 ENCOUNTER — Emergency Department (HOSPITAL_COMMUNITY)
Admission: EM | Admit: 2018-03-01 | Discharge: 2018-03-01 | Disposition: A | Discharge status: Home / Self Care | Payer: BLUE CROSS/BLUE SHIELD | Attending: Emergency Medicine | Admitting: Emergency Medicine

## 2018-03-01 DIAGNOSIS — Z5321 Procedure and treatment not carried out due to patient leaving prior to being seen by health care provider: Secondary | ICD-10-CM | POA: Insufficient documentation

## 2018-03-01 DIAGNOSIS — R05 Cough: Secondary | ICD-10-CM | POA: Insufficient documentation

## 2018-03-01 DIAGNOSIS — J019 Acute sinusitis, unspecified: Secondary | ICD-10-CM | POA: Diagnosis not present

## 2018-03-01 NOTE — ED Notes (Signed)
Called x1. No answer.

## 2018-03-01 NOTE — ED Triage Notes (Signed)
Pt complains of cold like sx for one month with no relief with OTC meds, she states that she's starting to have a productive cough and has generalized body aches

## 2018-03-14 DIAGNOSIS — B373 Candidiasis of vulva and vagina: Secondary | ICD-10-CM | POA: Diagnosis not present

## 2018-03-16 ENCOUNTER — Ambulatory Visit: Payer: BLUE CROSS/BLUE SHIELD | Admitting: Family Medicine

## 2018-03-28 DIAGNOSIS — M542 Cervicalgia: Secondary | ICD-10-CM | POA: Diagnosis not present

## 2018-04-04 DIAGNOSIS — M542 Cervicalgia: Secondary | ICD-10-CM | POA: Diagnosis not present

## 2018-04-21 DIAGNOSIS — M542 Cervicalgia: Secondary | ICD-10-CM | POA: Diagnosis not present

## 2018-04-29 DIAGNOSIS — M546 Pain in thoracic spine: Secondary | ICD-10-CM | POA: Diagnosis not present

## 2018-04-29 DIAGNOSIS — M542 Cervicalgia: Secondary | ICD-10-CM | POA: Diagnosis not present

## 2018-05-05 ENCOUNTER — Other Ambulatory Visit: Payer: Self-pay

## 2018-05-05 ENCOUNTER — Ambulatory Visit (INDEPENDENT_AMBULATORY_CARE_PROVIDER_SITE_OTHER): Payer: BLUE CROSS/BLUE SHIELD | Admitting: Family Medicine

## 2018-05-05 ENCOUNTER — Encounter: Payer: Self-pay | Admitting: Family Medicine

## 2018-05-05 VITALS — BP 107/73 | HR 88 | Temp 98.6°F | Resp 17 | Ht 64.0 in | Wt 116.8 lb

## 2018-05-05 DIAGNOSIS — F411 Generalized anxiety disorder: Secondary | ICD-10-CM

## 2018-05-05 DIAGNOSIS — F331 Major depressive disorder, recurrent, moderate: Secondary | ICD-10-CM

## 2018-05-05 DIAGNOSIS — G4700 Insomnia, unspecified: Secondary | ICD-10-CM | POA: Diagnosis not present

## 2018-05-05 MED ORDER — BUSPIRONE HCL 10 MG PO TABS: 20.0000 mg | ORAL_TABLET | Freq: Two times a day (BID) | ORAL | 2 refills | Status: DC

## 2018-05-05 MED ORDER — TRAZODONE HCL 50 MG PO TABS: 50.0000 mg | ORAL_TABLET | Freq: Every evening | ORAL | 3 refills | Status: DC | PRN

## 2018-05-05 NOTE — Progress Notes (Signed)
Patient ID: Faith Ross, female    DOB: 25-Apr-1990, 28 y.o.   MRN: [id number]  PCP: Bing Neighbors, FNP  Chief Complaint  Patient presents with  . Depression  . Anxiety    Subjective:  HPI Faith Ross is a 28 y.o. female presents for evaluation anxiety and depression follow-up. Patient reports that she stopped previously prescribed medications "they didn't work". Reports being prescribed multiple medications in the past and she never continues medications. Today she endorses continued panic attacks and anxiety. Reports a constant fear of dying for no particular reason. She lives alone and this fear increases when she is alone. She works 3rd shift and expresses problems with falling asleep. She reports no social interaction with other. Recently her best friend advised her she no longer cared to interact with her. She doesn't know why. She also reports current symptoms are affecting her job. She was previously in counseling and stopped since her last visit with me. She reports Xanax is the only medication that has helped her in the past. Denies suicidal ideations, homicidal ideations, or auditory hallucinations.  Social History   Socioeconomic History  . Marital status: Single    Spouse name: Not on file  . Number of children: Not on file  . Years of education: Not on file  . Highest education level: Not on file  Occupational History  . Not on file  Social Needs  . Financial resource strain: Not on file  . Food insecurity:    Worry: Not on file    Inability: Not on file  . Transportation needs:    Medical: Not on file    Non-medical: Not on file  Tobacco Use  . Smoking status: Former Games developer  . Smokeless tobacco: Never Used  Substance and Sexual Activity  . Alcohol use: Never    Frequency: Never  . Drug use: Never  . Sexual activity: Not on file  Lifestyle  . Physical activity:    Days per week: Not on file    Minutes per session: Not on file  . Stress: Not on  file  Relationships  . Social connections:    Talks on phone: Not on file    Gets together: Not on file    Attends religious service: Not on file    Active member of club or organization: Not on file    Attends meetings of clubs or organizations: Not on file    Relationship status: Not on file  . Intimate partner violence:    Fear of current or ex partner: Not on file    Emotionally abused: Not on file    Physically abused: Not on file    Forced sexual activity: Not on file  Other Topics Concern  . Not on file  Social History Narrative  . Not on file    Family History  Problem Relation Age of Onset  . Thyroid disease Mother    Review of Systems Pertinent negatives listed in HPI Patient Active Problem List   Diagnosis Date Noted  . Anxiety 12/02/2017  . Chest pain 12/02/2017  . Palpitations 12/02/2017    No Known Allergies  Prior to Admission medications   Medication Sig Start Date End Date Taking? Authorizing Provider  busPIRone (BUSPAR) 10 MG tablet Take 1 tablet (10 mg total) by mouth 3 (three) times daily. Patient not taking: Reported on 05/05/2018 12/01/17   Bing Neighbors, FNP  escitalopram (LEXAPRO) 10 MG tablet Take 1 tablet (10 mg total) by mouth at  bedtime. Patient not taking: Reported on 05/05/2018 12/01/17   Bing Neighbors, FNP    Past Medical, Surgical Family and Social History reviewed and updated.    Objective:   Today's Vitals   05/05/18 1339  BP: 107/73  Pulse: 88  Resp: 17  Temp: 98.6 F (37 C)  TempSrc: Oral  SpO2: 97%  Weight: 116 lb 12.8 oz (53 kg)  Height: 5\' 4"  (1.626 m)    Wt Readings from Last 3 Encounters:  05/05/18 116 lb 12.8 oz (53 kg)  03/01/18 118 lb 9.6 oz (53.8 kg)  02/20/18 115 lb (52.2 kg)     Physical Exam General appearance: alert, well developed, well nourished, cooperative and in no distress Head: Normocephalic, without obvious abnormality, atraumatic Respiratory: Respirations even and unlabored, normal  respiratory rate Heart: rate and rhythm normal. No gallop or murmurs noted on exam  Extremities: No gross deformities Skin: Skin color, texture, turgor normal. No rashes seen  Psych: Anxious, blunt affect, rapid pressure speech Neurologic: Alert, oriented to person, place, and time, thought content appropriate.  Assessment & Plan:  1. GAD (generalized anxiety disorder) -Encouraged to resume buspirone 20 mg twice daily 2. Moderate episode of recurrent major depressive disorder (HCC) -Resume escitalopram 10 mg once daily   3. Insomnia -Trial trazodone once daily at bedtime  Patient is scheduled with Jenel Lucks, LCSW here in one week. Patient is in need for counseling to further identify the underlying cause of symptoms and whether patient warrants a high level of psychotherapy.    A total of 25  minutes spent, greater than 50 % of this time was spent counseling and coordination of care.  Return medication management in 3 months   -The patient was given clear instructions to go to ER or return to medical center if symptoms do not improve, worsen or new problems develop. The patient verbalized understanding.    Joaquin Courts, FNP Primary Care at [facility] [address] [place] [address] [phone]: [phone]

## 2018-05-05 NOTE — Patient Instructions (Addendum)
Insomnia Insomnia is a sleep disorder that makes it difficult to fall asleep or stay asleep. Insomnia can cause fatigue, low energy, difficulty concentrating, mood swings, and poor performance at work or school. There are three different ways to classify insomnia:  Difficulty falling asleep.  Difficulty staying asleep.  Waking up too early in the morning. Any type of insomnia can be long-term (chronic) or short-term (acute). Both are common. Short-term insomnia usually lasts for three months or less. Chronic insomnia occurs at least three times a week for longer than three months. What are the causes? Insomnia may be caused by another condition, situation, or substance, such as:  Anxiety.  Certain medicines.  Gastroesophageal reflux disease (GERD) or other gastrointestinal conditions.  Asthma or other breathing conditions.  Restless legs syndrome, sleep apnea, or other sleep disorders.  Chronic pain.  Menopause.  Stroke.  Abuse of alcohol, tobacco, or illegal drugs.  Mental health conditions, such as depression.  Caffeine.  Neurological disorders, such as Alzheimer's disease.  An overactive thyroid (hyperthyroidism). Sometimes, the cause of insomnia may not be known. What increases the risk? Risk factors for insomnia include:  Gender. Women are affected more often than men.  Age. Insomnia is more common as you get older.  Stress.  Lack of exercise.  Irregular work schedule or working night shifts.  Traveling between different time zones.  Certain medical and mental health conditions. What are the signs or symptoms? If you have insomnia, the main symptom is having trouble falling asleep or having trouble staying asleep. This may lead to other symptoms, such as:  Feeling fatigued or having low energy.  Feeling nervous about going to sleep.  Not feeling rested in the morning.  Having trouble concentrating.  Feeling irritable, anxious, or depressed. How  is this diagnosed? This condition may be diagnosed based on:  Your symptoms and medical history. Your health care provider may ask about: ? Your sleep habits. ? Any medical conditions you have. ? Your mental health.  A physical exam. How is this treated? Treatment for insomnia depends on the cause. Treatment may focus on treating an underlying condition that is causing insomnia. Treatment may also include:  Medicines to help you sleep.  Counseling or therapy.  Lifestyle adjustments to help you sleep better. Follow these instructions at home: Eating and drinking   Limit or avoid alcohol, caffeinated beverages, and cigarettes, especially close to bedtime. These can disrupt your sleep.  Do not eat a large meal or eat spicy foods right before bedtime. This can lead to digestive discomfort that can make it hard for you to sleep. Sleep habits   Keep a sleep diary to help you and your health care provider figure out what could be causing your insomnia. Write down: ? When you sleep. ? When you wake up during the night. ? How well you sleep. ? How rested you feel the next day. ? Any side effects of medicines you are taking. ? What you eat and drink.  Make your bedroom a dark, comfortable place where it is easy to fall asleep. ? Put up shades or blackout curtains to block light from outside. ? Use a white noise machine to block noise. ? Keep the temperature cool.  Limit screen use before bedtime. This includes: ? Watching TV. ? Using your smartphone, tablet, or computer.  Stick to a routine that includes going to bed and waking up at the same times every day and night. This can help you fall asleep faster. Consider  making a quiet activity, such as reading, part of your nighttime routine.  Try to avoid taking naps during the day so that you sleep better at night.  Get out of bed if you are still awake after 15 minutes of trying to sleep. Keep the lights down, but try reading or  doing a quiet activity. When you feel sleepy, go back to bed. General instructions  Take over-the-counter and prescription medicines only as told by your health care provider.  Exercise regularly, as told by your health care provider. Avoid exercise starting several hours before bedtime.  Use relaxation techniques to manage stress. Ask your health care provider to suggest some techniques that may work well for you. These may include: ? Breathing exercises. ? Routines to release muscle tension. ? Visualizing peaceful scenes.  Make sure that you drive carefully. Avoid driving if you feel very sleepy.  Keep all follow-up visits as told by your health care provider. This is important. Contact a health care provider if:  You are tired throughout the day.  You have trouble in your daily routine due to sleepiness.  You continue to have sleep problems, or your sleep problems get worse. Get help right away if:  You have serious thoughts about hurting yourself or someone else. If you ever feel like you may hurt yourself or others, or have thoughts about taking your own life, get help right away. You can go to your nearest emergency department or call:  Your local emergency services (911 in the [place]  A suicide crisis helpline, such as the National Suicide Prevention Lifeline at 978-816-4679. This is open 24 hours a day. Summary  Insomnia is a sleep disorder that makes it difficult to fall asleep or stay asleep.  Insomnia can be long-term (chronic) or short-term (acute).  Treatment for insomnia depends on the cause. Treatment may focus on treating an underlying condition that is causing insomnia.  Keep a sleep diary to help you and your health care provider figure out what could be causing your insomnia. This information is not intended to replace advice given to you by your health care provider. Make sure you discuss any questions you have with your health care provider. Document  Released: 02/07/2000 Document Revised: 11/19/2016 Document Reviewed: 11/19/2016 Elsevier Interactive Patient Education  2019 Elsevier Inc.   Major Depressive Disorder, Adult Major depressive disorder (MDD) is a mental health condition. MDD often makes you feel sad, hopeless, or helpless. MDD can also cause symptoms in your body. MDD can affect your:  Work.  School.  Relationships.  Other normal activities. MDD can range from mild to very bad. It may occur once (single episode MDD). It can also occur many times (recurrent MDD). The main symptoms of MDD often include:  Feeling sad, depressed, or irritable most of the time.  Loss of interest. MDD symptoms also include:  Sleeping too much or too little.  Eating too much or too little.  A change in your weight.  Feeling tired (fatigue) or having low energy.  Feeling worthless.  Feeling guilty.  Trouble making decisions.  Trouble thinking clearly.  Thoughts of suicide or harming others.  Feeling weak.  Feeling agitated.  Keeping yourself from being around other people (isolation). Follow these instructions at home: Activity  Do these things as told by your doctor: ? Go back to your normal activities. ? Exercise regularly. ? Spend time outdoors. Alcohol  Talk with your doctor about how alcohol can affect your antidepressant medicines.  Do not  drink alcohol. Or, limit how much alcohol you drink. ? This means no more than 1 drink a day for nonpregnant women and 2 drinks a day for men. One drink equals one of these:  12 oz of beer.  5 oz of wine.  1 oz of hard liquor. General instructions  Take over-the-counter and prescription medicines only as told by your doctor.  Eat a healthy diet.  Get plenty of sleep.  Find activities that you enjoy. Make time to do them.  Think about joining a support group. Your doctor may be able to suggest a group for you.  Keep all follow-up visits as told by your  doctor. This is important. Where to find more information:  [employer] on Mental Illness: ? www.nami.org  [place] [employer] of Mental Health: ? http://www.maynard.net/  National Suicide Prevention Lifeline: ? (361)549-2316. This is free, 24-hour help. Contact a doctor if:  Your symptoms get worse.  You have new symptoms. Get help right away if:  You self-harm.  You see, hear, taste, smell, or feel things that are not present (hallucinate). If you ever feel like you may hurt yourself or others, or have thoughts about taking your own life, get help right away. You can go to your nearest emergency department or call:  Your local emergency services (911 in the [place]  A suicide crisis helpline, such as the National Suicide Prevention Lifeline: ? 940-731-1196. This is open 24 hours a day. This information is not intended to replace advice given to you by your health care provider. Make sure you discuss any questions you have with your health care provider. Document Released: 01/21/2015 Document Revised: 10/27/2015 Document Reviewed: 10/27/2015 Elsevier Interactive Patient Education  2019 Elsevier Inc.   Generalized Anxiety Disorder, Adult Generalized anxiety disorder (GAD) is a mental health disorder. People with this condition constantly worry about everyday events. Unlike normal anxiety, worry related to GAD is not triggered by a specific event. These worries also do not fade or get better with time. GAD interferes with life functions, including relationships, work, and school. GAD can vary from mild to severe. People with severe GAD can have intense waves of anxiety with physical symptoms (panic attacks). What are the causes? The exact cause of GAD is not known. What increases the risk? This condition is more likely to develop in:  Women.  People who have a family history of anxiety disorders.  People who are very shy.  People who experience very stressful life  events, such as the death of a loved one.  People who have a very stressful family environment. What are the signs or symptoms? People with GAD often worry excessively about many things in their lives, such as their health and family. They may also be overly concerned about:  Doing well at work.  Being on time.  Natural disasters.  Friendships. Physical symptoms of GAD include:  Fatigue.  Muscle tension or having muscle twitches.  Trembling or feeling shaky.  Being easily startled.  Feeling like your heart is pounding or racing.  Feeling out of breath or like you cannot take a deep breath.  Having trouble falling asleep or staying asleep.  Sweating.  Nausea, diarrhea, or irritable bowel syndrome (IBS).  Headaches.  Trouble concentrating or remembering facts.  Restlessness.  Irritability. How is this diagnosed? Your health care provider can diagnose GAD based on your symptoms and medical history. You will also have a physical exam. The health care provider will ask specific questions about  your symptoms, including how severe they are, when they started, and if they come and go. Your health care provider may ask you about your use of alcohol or drugs, including prescription medicines. Your health care provider may refer you to a mental health specialist for further evaluation. Your health care provider will do a thorough examination and may perform additional tests to rule out other possible causes of your symptoms. To be diagnosed with GAD, a person must have anxiety that:  Is out of his or her control.  Affects several different aspects of his or her life, such as work and relationships.  Causes distress that makes him or her unable to take part in normal activities.  Includes at least three physical symptoms of GAD, such as restlessness, fatigue, trouble concentrating, irritability, muscle tension, or sleep problems. Before your health care provider can confirm a  diagnosis of GAD, these symptoms must be present more days than they are not, and they must last for six months or longer. How is this treated? The following therapies are usually used to treat GAD:  Medicine. Antidepressant medicine is usually prescribed for long-term daily control. Antianxiety medicines may be added in severe cases, especially when panic attacks occur.  Talk therapy (psychotherapy). Certain types of talk therapy can be helpful in treating GAD by providing support, education, and guidance. Options include: ? Cognitive behavioral therapy (CBT). People learn coping skills and techniques to ease their anxiety. They learn to identify unrealistic or negative thoughts and behaviors and to replace them with positive ones. ? Acceptance and commitment therapy (ACT). This treatment teaches people how to be mindful as a way to cope with unwanted thoughts and feelings. ? Biofeedback. This process trains you to manage your body's response (physiological response) through breathing techniques and relaxation methods. You will work with a therapist while machines are used to monitor your physical symptoms.  Stress management techniques. These include yoga, meditation, and exercise. A mental health specialist can help determine which treatment is best for you. Some people see improvement with one type of therapy. However, other people require a combination of therapies. Follow these instructions at home:  Take over-the-counter and prescription medicines only as told by your health care provider.  Try to maintain a normal routine.  Try to anticipate stressful situations and allow extra time to manage them.  Practice any stress management or self-calming techniques as taught by your health care provider.  Do not punish yourself for setbacks or for not making progress.  Try to recognize your accomplishments, even if they are small.  Keep all follow-up visits as told by your health care  provider. This is important. Contact a health care provider if:  Your symptoms do not get better.  Your symptoms get worse.  You have signs of depression, such as: ? A persistently sad, cranky, or irritable mood. ? Loss of enjoyment in activities that used to bring you joy. ? Change in weight or eating. ? Changes in sleeping habits. ? Avoiding friends or family members. ? Loss of energy for normal tasks. ? Feelings of guilt or worthlessness. Get help right away if:  You have serious thoughts about hurting yourself or others. If you ever feel like you may hurt yourself or others, or have thoughts about taking your own life, get help right away. You can go to your nearest emergency department or call:  Your local emergency services (911 in the [place]  A suicide crisis helpline, such as the National Suicide Prevention  Lifeline at (640)410-0541. This is open 24 hours a day. Summary  Generalized anxiety disorder (GAD) is a mental health disorder that involves worry that is not triggered by a specific event.  People with GAD often worry excessively about many things in their lives, such as their health and family.  GAD may cause physical symptoms such as restlessness, trouble concentrating, sleep problems, frequent sweating, nausea, diarrhea, headaches, and trembling or muscle twitching.  A mental health specialist can help determine which treatment is best for you. Some people see improvement with one type of therapy. However, other people require a combination of therapies. This information is not intended to replace advice given to you by your health care provider. Make sure you discuss any questions you have with your health care provider. Document Released: 06/06/2012 Document Revised: 12/31/2015 Document Reviewed: 12/31/2015 Elsevier Interactive Patient Education  2019 [employer].

## 2018-05-10 ENCOUNTER — Other Ambulatory Visit: Payer: Self-pay

## 2018-05-10 ENCOUNTER — Ambulatory Visit (INDEPENDENT_AMBULATORY_CARE_PROVIDER_SITE_OTHER): Payer: BLUE CROSS/BLUE SHIELD | Admitting: Licensed Clinical Social Worker

## 2018-05-10 DIAGNOSIS — F419 Anxiety disorder, unspecified: Secondary | ICD-10-CM | POA: Diagnosis not present

## 2018-05-10 DIAGNOSIS — F331 Major depressive disorder, recurrent, moderate: Secondary | ICD-10-CM

## 2018-05-16 NOTE — BH Specialist Note (Signed)
Integrated Behavioral Health Initial Visit  MRN: [id number] Name: Faith Ross  Number of Integrated Behavioral Health Clinician visits:: 1/6 Session Start time: 10:30 AM  Session End time: 11:00 AM Total time: 30 minutes  Type of Service: Integrated Behavioral Health- Individual Interpretor:No. Interpretor Name and Language: N/A   SUBJECTIVE: Faith Ross is a 28 y.o. female accompanied by self Patient was referred by FNP Tiburcio Pea for depression and anxiety. Patient reports the following symptoms/concerns: Pt reports difficulty managing mental health conditions. She has diagnosis of Severe Panic Disorder.  Duration of problem: October 2017; Severity of problem: moderate  OBJECTIVE: Mood: Anxious and Affect: Appropriate Risk of harm to self or others: No plan to harm self or others  LIFE CONTEXT: Family and Social: Pt receives support from family. 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 Currently participating in medication management through PCP Life Changes: Pt reports difficulty managing mental health conditions.   GOALS ADDRESSED: Patient will: 1. Reduce symptoms of: anxiety and depression 2. Increase knowledge and/or ability of: coping skills and healthy habits  3. Demonstrate ability to: Increase healthy adjustment to current life circumstances and Increase adequate support systems for patient/family  INTERVENTIONS: Interventions utilized: Mindfulness or Management consultant, Supportive Counseling, Sleep Hygiene and Psychoeducation and/or Health Education  Standardized Assessments completed: Not Needed  ASSESSMENT: Patient currently experiencing depression and anxiety. She reports panic attacks, racing thoughts, pain in chest from previous injury, and difficulty sleeping. Denies  current SI/HI. Receives support from family.   Patient may benefit from psychotherapy. She is currently participating in medication management through PCP. LCSWA educated pt on the correlation between one's physical and mental health, in addition, to how stress can negatively impact health. Pt was successful in identifying triggers, such as, pain and grieving the deaths of two clients within one month. Healthy coping skills were discussed to manage stressors and decrease symptoms.   PLAN: 1. Follow up with behavioral health clinician on : Pt was encouraged to contact LCSWA if symptoms worsen or fail to improve to schedule behavioral appointments at [facility]. 2. Behavioral recommendations: Pt will comply with medication management and utilize strategies to improve sleep hygiene 3. Referral(s): Integrated [employer] (In Clinic) 4. "From scale of 1-10, how likely are you to follow plan?": 8  Bridgett Larsson, LCSW 05/16/2018 10:45 AM

## 2018-05-17 ENCOUNTER — Institutional Professional Consult (permissible substitution) (INDEPENDENT_AMBULATORY_CARE_PROVIDER_SITE_OTHER): Payer: BLUE CROSS/BLUE SHIELD | Admitting: Licensed Clinical Social Worker

## 2018-05-17 ENCOUNTER — Telehealth (INDEPENDENT_AMBULATORY_CARE_PROVIDER_SITE_OTHER): Payer: BLUE CROSS/BLUE SHIELD | Admitting: Licensed Clinical Social Worker

## 2018-05-17 ENCOUNTER — Other Ambulatory Visit: Payer: Self-pay

## 2018-05-17 DIAGNOSIS — F331 Major depressive disorder, recurrent, moderate: Secondary | ICD-10-CM | POA: Diagnosis not present

## 2018-05-17 DIAGNOSIS — F410 Panic disorder [episodic paroxysmal anxiety] without agoraphobia: Secondary | ICD-10-CM

## 2018-05-17 DIAGNOSIS — G4709 Other insomnia: Secondary | ICD-10-CM | POA: Diagnosis not present

## 2018-05-17 DIAGNOSIS — F411 Generalized anxiety disorder: Secondary | ICD-10-CM

## 2018-05-17 DIAGNOSIS — R0789 Other chest pain: Secondary | ICD-10-CM

## 2018-05-19 DIAGNOSIS — M542 Cervicalgia: Secondary | ICD-10-CM | POA: Diagnosis not present

## 2018-05-19 DIAGNOSIS — M546 Pain in thoracic spine: Secondary | ICD-10-CM | POA: Diagnosis not present

## 2018-05-20 ENCOUNTER — Ambulatory Visit (INDEPENDENT_AMBULATORY_CARE_PROVIDER_SITE_OTHER): Payer: BLUE CROSS/BLUE SHIELD | Admitting: Family Medicine

## 2018-05-20 ENCOUNTER — Other Ambulatory Visit: Payer: Self-pay

## 2018-05-20 DIAGNOSIS — J309 Allergic rhinitis, unspecified: Secondary | ICD-10-CM

## 2018-05-20 DIAGNOSIS — R519 Headache, unspecified: Secondary | ICD-10-CM

## 2018-05-20 DIAGNOSIS — R51 Headache: Secondary | ICD-10-CM

## 2018-05-20 MED ORDER — IPRATROPIUM BROMIDE 0.03 % NA SOLN: 2.0000 | Freq: Three times a day (TID) | NASAL | 1 refills | Status: DC | PRN

## 2018-05-20 MED ORDER — LEVOCETIRIZINE DIHYDROCHLORIDE 5 MG PO TABS: 5.0000 mg | ORAL_TABLET | Freq: Every evening | ORAL | 1 refills | Status: DC

## 2018-05-20 MED ORDER — METOCLOPRAMIDE HCL 10 MG PO TABS: 10.0000 mg | ORAL_TABLET | Freq: Three times a day (TID) | ORAL | 1 refills | Status: DC | PRN

## 2018-05-20 NOTE — BH Specialist Note (Signed)
Integrated Behavioral Health Visit via Telemedicine (Telephone)  05/20/2018 Faith Ross [id number]   Session Start time: 2:30 PM  Session End time: 2:55 PM Total time: 25 minutes  Referring Provider: FNP Tiburcio Pea Type of Visit: Telephonic Patient location: home [facility] Provider location: Office All persons participating in visit: Pt  Confirmed patient's address: Yes  Confirmed patient's phone number: Yes  Any changes to demographics: No   Confirmed patient's insurance: Yes  Any changes to patient's insurance: No   Discussed confidentiality: Yes    The following statements were read to the patient and/or legal guardian that are established with the [facility] Provider.  "The purpose of this phone visit is to provide behavioral health care while limiting exposure to the coronavirus (COVID19). "  "By engaging in this telephone visit, you consent to the provision of healthcare.  Additionally, you authorize for your insurance to be billed for the services provided during this telephone visit."   Patient and/or legal guardian consented to telephone visit: Yes   PRESENTING CONCERNS: Patient and/or family reports the following symptoms/concerns: Pt has difficulty managing anxiety. Complains of multiple panic attacks Duration of problem: Ongoing; Severity of problem: moderate  STRENGTHS (Protective Factors/Coping Skills): Pt has good insight and utilizes grounding strategies during panic attacks  GOALS ADDRESSED: Patient will: 1.  Reduce symptoms of: anxiety  2.  Increase knowledge and/or ability of: self-management skills  3.  Demonstrate ability to: utilize healthy strategies  INTERVENTIONS: Interventions utilized:  Solution-Focused Strategies Standardized Assessments completed: Not Needed  ASSESSMENT: Patient currently experiencing depression and anxiety. She reports panic attacks, racing thoughts, pain in chest from previous injury, and difficulty sleeping. Denies  current SI/HI. Receives support from family.   Patient is currently participating in medication management through PCP; however, has not taken trazedone to assist with quality sleep. LCSWA discussed the importance of medication compliance and encouraged pt to discuss medication concerns with PCP. Supportive resources to assist with assessing panic attacks were mailed to pt's address on file to assist pt with identifying patterns.    PLAN: 1. Follow up with behavioral health clinician on : Pt was encouraged tocontact LCSWA if symptoms worsen or fail to improveto schedule behavioral appointments at [facility]. 2. Behavioral recommendations: Pt will utilize strategies to assist in management of anxiety. P 3. Referral(s): Integrated [employer] (In Clinic)  Bridgett Larsson, [place] 05/20/2018 3:00 PM

## 2018-05-20 NOTE — Progress Notes (Signed)
Patient ID: Faith Ross, female    DOB: 11/28/1990, 28 y.o.   MRN: [id number]  PCP: Bing Neighbors, FNP  Subjective:  HPI  Faith Ross is a 28 y.o. female consent for telephonic encounter.  Faith Ross is calling today from her home.  Provider is located at Primary Care office. Faith Ross complains of sinus congestion, facial tenderness, and frontal headache x 5 days. She was treated for sinusitis with antibiotic therapy and reports minimal improvement of symptoms. She took Claritin and used an OTC nasal spray for a total of 5 days without improvement. Denies cough, wheezing, chest tightness, body aches, or fever. No high risk exposures for COVID. History of seasonal allergies.  Social History   Socioeconomic History  . Marital status: Single    Spouse name: Not on file  . Number of children: Not on file  . Years of education: Not on file  . Highest education level: Not on file  Occupational History  . Not on file  Social Needs  . Financial resource strain: Not on file  . Food insecurity:    Worry: Not on file    Inability: Not on file  . Transportation needs:    Medical: Not on file    Non-medical: Not on file  Tobacco Use  . Smoking status: Former Games developer  . Smokeless tobacco: Never Used  Substance and Sexual Activity  . Alcohol use: Never    Frequency: Never  . Drug use: Never  . Sexual activity: Not on file  Lifestyle  . Physical activity:    Days per week: Not on file    Minutes per session: Not on file  . Stress: Not on file  Relationships  . Social connections:    Talks on phone: Not on file    Gets together: Not on file    Attends religious service: Not on file    Active member of club or organization: Not on file    Attends meetings of clubs or organizations: Not on file    Relationship status: Not on file  . Intimate partner violence:    Fear of current or ex partner: Not on file    Emotionally abused: Not on file    Physically abused: Not on file   Forced sexual activity: Not on file  Other Topics Concern  . Not on file  Social History Narrative  . Not on file    Family History  Problem Relation Age of Onset  . Thyroid disease Mother    Review of Systems Pertinent negatives listed in HPI Patient Active Problem List   Diagnosis Date Noted  . Anxiety 12/02/2017  . Chest pain 12/02/2017  . Palpitations 12/02/2017    No Known Allergies  Prior to Admission medications   Medication Sig Start Date End Date Taking? Authorizing Provider  busPIRone (BUSPAR) 10 MG tablet Take 2 tablets (20 mg total) by mouth 2 (two) times daily. 05/05/18   Bing Neighbors, FNP  escitalopram (LEXAPRO) 10 MG tablet Take 1 tablet (10 mg total) by mouth at bedtime. Patient not taking: Reported on 05/05/2018 12/01/17   Bing Neighbors, FNP  traZODone (DESYREL) 50 MG tablet Take 1 tablet (50 mg total) by mouth at bedtime as needed for sleep. 05/05/18   Bing Neighbors, FNP    Past Medical, Surgical Family and Social History reviewed and updated.    Assessment & Plan:  1. Allergic rhinitis, unspecified seasonality, unspecified trigger 2. Sinus headache -Advised to start cetrizine 10 mg once  daily and atrovent nasal spray 2 sprays TID as needed -For acute headache pain associated with sinus pressure, start Reglan 10 mg TID as needed.  A total of 15 minutes spent gathering history of present illness, discussing differential diagnosis, educating regarding signs and symptoms of COVID, and prescribing a plan of care.    Joaquin Courts, FNP Primary Care at [facility] [address], [place] [address] [phone]: [phone]

## 2018-06-20 ENCOUNTER — Other Ambulatory Visit: Payer: Self-pay | Admitting: Emergency Medicine

## 2018-06-20 DIAGNOSIS — N644 Mastodynia: Secondary | ICD-10-CM | POA: Diagnosis not present

## 2018-06-28 ENCOUNTER — Other Ambulatory Visit: Payer: BLUE CROSS/BLUE SHIELD

## 2018-07-04 DIAGNOSIS — M542 Cervicalgia: Secondary | ICD-10-CM | POA: Diagnosis not present

## 2018-07-04 DIAGNOSIS — M546 Pain in thoracic spine: Secondary | ICD-10-CM | POA: Diagnosis not present

## 2018-07-21 ENCOUNTER — Other Ambulatory Visit: Payer: Self-pay

## 2018-07-21 ENCOUNTER — Encounter: Payer: Self-pay | Admitting: Family Medicine

## 2018-07-21 ENCOUNTER — Ambulatory Visit (INDEPENDENT_AMBULATORY_CARE_PROVIDER_SITE_OTHER): Payer: BLUE CROSS/BLUE SHIELD | Admitting: Family Medicine

## 2018-07-21 DIAGNOSIS — J309 Allergic rhinitis, unspecified: Secondary | ICD-10-CM | POA: Diagnosis not present

## 2018-07-21 DIAGNOSIS — J302 Other seasonal allergic rhinitis: Secondary | ICD-10-CM

## 2018-07-21 MED ORDER — LEVOCETIRIZINE DIHYDROCHLORIDE 5 MG PO TABS: 5.0000 mg | ORAL_TABLET | Freq: Every evening | ORAL | 1 refills | Status: DC

## 2018-07-21 NOTE — Progress Notes (Signed)
Virtual Visit via Telephone Note  I connected with Faith Ross on 07/21/18 at  2:50 PM EDT by telephone and verified that I am speaking with the correct person using two identifiers.  Location: Patient: Located at home during today's encounter  Provider: Located at primary care office     I discussed the limitations, risks, security and privacy concerns of performing an evaluation and management service by telephone and the availability of in person appointments. I also discussed with the patient that there may be a patient responsible charge related to this service. The patient expressed understanding and agreed to proceed.   History of Present Illness: Faith Ross is present at today's encounter for evaluation of persistent allergy symptoms. Patient was evaluated for similar symptoms back on May 20, 2018.  She complained of nasal congestion and itchy eyes which worsened when outside and exposed to environmental substances such as pollen.  She was prescribed levocetirizine and ipratropium spray.  At present she is only using the ipratropium spray which provides relief for short course of time.  She reports she picked up the levo cetirizine but took another medication, trazodone, thinking that that was the allergy medicine. She was able to locate the levocetirizine is willing to try medication to see if it helps with symptoms. She is also requesting a referral to allergy specialty for allergy testing and evaluation of symptoms. Assessment and Plan: 1. Allergic rhinitis, unspecified seasonality, unspecified trigger 2. Seasonal allergic rhinitis, unspecified trigger -Patient was unable to locate levo cetirizine is willing to take medication daily as prescribed for asthma symptoms. -Continue Atrovent as needed and as prescribed. Referral placed to allergy specialty for allergy testing and possible consideration of allergy injections. - Ambulatory referral to Allergy  Follow Up Instructions:     I discussed the assessment and treatment plan with the patient. The patient was provided an opportunity to ask questions and all were answered. The patient agreed with the plan and demonstrated an understanding of the instructions.   The patient was advised to call back or seek an in-person evaluation if the symptoms worsen or if the condition fails to improve as anticipated.  I provided 20 minutes of non-face-to-face time during this encounter.   Joaquin Courts, FNP

## 2018-07-21 NOTE — Progress Notes (Deleted)
Called patient to initiate their telephone visit with provider Joaquin Courts, FNP-C. Verified date of birth. Patient having continued allergy symptoms. Has stopped taking all medications except for Atrovent nasal spray. C/o itchy eyes & nasal congestion. KWalker, CMA.

## 2018-07-27 ENCOUNTER — Encounter: Payer: Self-pay | Admitting: Allergy

## 2018-07-27 ENCOUNTER — Ambulatory Visit: Payer: BLUE CROSS/BLUE SHIELD | Admitting: Allergy

## 2018-07-27 ENCOUNTER — Other Ambulatory Visit: Payer: Self-pay

## 2018-07-27 VITALS — BP 108/72 | HR 53 | Temp 98.5°F | Resp 116 | Ht 65.0 in | Wt 119.0 lb

## 2018-07-27 DIAGNOSIS — J3089 Other allergic rhinitis: Secondary | ICD-10-CM

## 2018-07-27 DIAGNOSIS — H1013 Acute atopic conjunctivitis, bilateral: Secondary | ICD-10-CM | POA: Diagnosis not present

## 2018-07-27 MED ORDER — OLOPATADINE HCL 0.7 % OP SOLN: 1.0000 [drp] | Freq: Every day | OPHTHALMIC | 5 refills | Status: DC

## 2018-07-27 MED ORDER — LEVOCETIRIZINE DIHYDROCHLORIDE 5 MG PO TABS: 5.0000 mg | ORAL_TABLET | Freq: Every evening | ORAL | 5 refills | Status: DC

## 2018-07-27 MED ORDER — MONTELUKAST SODIUM 10 MG PO TABS: 10.0000 mg | ORAL_TABLET | Freq: Every day | ORAL | 5 refills | Status: DC

## 2018-07-27 MED ORDER — FLUTICASONE PROPIONATE 50 MCG/ACT NA SUSP: 2.0000 | Freq: Two times a day (BID) | NASAL | 5 refills | Status: DC

## 2018-07-27 NOTE — Progress Notes (Signed)
New Patient Note  RE: Faith Ross MRN: [id number] DOB: 03-27-1990 Date of Office Visit: 07/27/2018  Referring provider: Bing Neighbors, FNP Primary care provider: Bing Neighbors, FNP  Chief Complaint: allergies  History of present illness: Faith Ross is a 28 y.o. female presenting today for consultation for allergic rhinitis.  Several years ago she started developing allergy symptoms including sneezing, nasal congestion, itchy eyes, throat itch.  Symptoms present in the spring primarily.   She has tried Careers adviser, Zyrtec, Claritin and has tried the decongestant versions as well.  She does not feel that these antihistamines helped at all.  She also has used bendaryl.  Her PCP recently recommend use of Xyzal and she states she has only taken one dose.  She also has been prescribed ipratropium nasal spray.  She states this nasal spray does not work at all.  She has also tried "off brand" nasal sprays that also did not help.  She also has tried an OTC eyedrop that she reports only works about a few minutes and made her eyes burn.    No history of asthma or food allergy.  She reports history of alopecia and states she gets "eczema" from wearing wigs.    Review of systems: Review of Systems  Constitutional: Negative for chills, fever and malaise/fatigue.  HENT: Positive for congestion. Negative for ear discharge, nosebleeds, sinus pain and sore throat.   Eyes: Negative for pain, discharge and redness.  Respiratory: Negative for cough, shortness of breath and wheezing.   Cardiovascular: Negative for chest pain.  Gastrointestinal: Negative for abdominal pain, constipation, diarrhea, heartburn, nausea and vomiting.  Musculoskeletal: Negative for joint pain.  Skin: Negative for itching and rash.  Neurological: Negative for headaches.    All other systems negative unless noted above in HPI  Past medical history: Past Medical History:  Diagnosis Date  . Alopecia   .  Anxiety   . Chest wall pain     Past surgical history: Past Surgical History:  Procedure Laterality Date  . TONSILLECTOMY      Family history:  Family History  Problem Relation Age of Onset  . Thyroid disease Mother     Social history: Lives in an apartment with carpeting in bedroom.  No pets in the home.  No concern for roaches in the home.  She states her workplace has roaches.  She works as a Radiation protection practitioner.  Denies smoking history.           Medication List: Allergies as of 07/27/2018   No Known Allergies     Medication List       Accurate as of July 27, 2018 12:44 PM. If you have any questions, ask your nurse or doctor.        fluticasone 50 MCG/ACT nasal spray Commonly known as:  FLONASE Place 2 sprays into both nostrils 2 (two) times a day. Started by:   Larose Hires, MD   ipratropium 0.03 % nasal spray Commonly known as:  ATROVENT Place 2 sprays into both nostrils 3 (three) times daily as needed for rhinitis.   levocetirizine 5 MG tablet Commonly known as:  XYZAL Take 1 tablet (5 mg total) by mouth every evening. What changed:  Another medication with the same name was added. Make sure you understand how and when to take each. Changed by:   Larose Hires, MD   levocetirizine 5 MG tablet Commonly known as:  Xyzal Take 1 tablet (5 mg total) by mouth every evening.  What changed:  You were already taking a medication with the same name, and this prescription was added. Make sure you understand how and when to take each. Changed by:   Larose HiresPatricia , MD   montelukast 10 MG tablet Commonly known as:  SINGULAIR Take 1 tablet (10 mg total) by mouth at bedtime. Started by:   Larose HiresPatricia , MD   Olopatadine HCl 0.7 % Soln Commonly known as:  Pazeo Apply 1 drop to eye daily. Started by:   Larose HiresPatricia , MD       Known medication allergies: No Known Allergies   Physical examination: Blood  pressure 108/72, pulse (!) 53, temperature 98.5 F (36.9 C), temperature source Temporal, resp. rate (!) 116, height 5\' 5"  (1.651 m), weight 119 lb (54 kg), SpO2 99 %.  General: Alert, interactive, in no acute distress. HEENT: PERRLA, TMs pearly gray, turbinates markedly edematous with clear discharge, post-pharynx non erythematous. Neck: Supple without lymphadenopathy. Lungs: Clear to auscultation without wheezing, rhonchi or rales. {no increased work of breathing. CV: Normal S1, S2 without murmurs. Abdomen: Nondistended, nontender. Skin: Warm and dry, without lesions or rashes. Extremities:  No clubbing, cyanosis or edema. Neuro:   Grossly intact.  Diagnositics/Labs:  Allergy testing: environmental allergy skin prick testing is positive to grass pollens, weed pollens, tree pollens, pullulara, Dust mites, cat, dog, cockroach. Intradermal testing is negative.  Allergy testing results were read and interpreted by provider, documented by clinical staff.   Assessment and plan: Allergic rhinitis with conjunctivitis - environmental allergy skin testing is positive to grasses, weeds, trees, mold, dust mite, cat, dog, cockroach.   - allergen avoidance measures discussed/handouts provided - continue Xyzal 5mg  daily - start Singulair 10mg  daily at bedtime - recommend use of nasal saline rinse prior to use of any medicated nasal sprays to help clean/flush out the nose and sinuses - for severe nasal congestion use nasal Afrin 2 sprays each nostril twice day for next 3-5 days (do not use longer than 5 days in a row or could cause worsening congestion).   After use wait 5-10 minutes or until you can breathe more freely and then use nasal steroid spray like Flonase, Rhinocort or Nasacort 2 sprays twice a day at this time to help control congestion.  Continue to use the nasal steroid spray after stopping the Afrin.  Once nasal congestion is under control, decrease to once a day use of nasal steroid  spray. - for itchy/watery/red eyes use Pazeo 1 drop each eye daily as needed - if medication regimen is not effective then consider allergen immunotherapy (allergy shots).     Follow-up 3-4 months or sooner if needed  I appreciate the opportunity to [facility]. Please do not hesitate to contact me with questions.  Sincerely,   Margo Aye , MD Allergy/Immunology [facility]

## 2018-07-27 NOTE — Patient Instructions (Addendum)
Allergies - environmental allergy skin testing is positive to grasses, weeds, trees, mold, dust mite, cat, dog, cockroach.   - allergen avoidance measures discussed/handouts provided - continue Xyzal 5mg  daily - start Singulair 10mg  daily at bedtime - recommend use of nasal saline rinse prior to use of any medicated nasal sprays to help clean/flush out the nose and sinuses - for severe nasal congestion use nasal Afrin 2 sprays each nostril twice day for next 3-5 days (do not use longer than 5 days in a row or could cause worsening congestion).   After use wait 5-10 minutes or until you can breathe more freely and then use nasal steroid spray like Flonase, Rhinocort or Nasacort 2 sprays twice a day at this time to help control congestion.  Continue to use the nasal steroid spray after stopping the Afrin - for itchy/watery/red eyes use Pazeo 1 drop each eye daily as needed - if medication regimen is not effective then consider allergen immunotherapy (allergy shots).     Follow-up 3-4 months or sooner if needed

## 2018-08-02 DIAGNOSIS — T22211A Burn of second degree of right forearm, initial encounter: Secondary | ICD-10-CM | POA: Diagnosis not present

## 2018-08-05 ENCOUNTER — Ambulatory Visit: Payer: BLUE CROSS/BLUE SHIELD | Admitting: Family Medicine

## 2018-08-05 DIAGNOSIS — T22211D Burn of second degree of right forearm, subsequent encounter: Secondary | ICD-10-CM | POA: Diagnosis not present

## 2018-08-07 DIAGNOSIS — T22211D Burn of second degree of right forearm, subsequent encounter: Secondary | ICD-10-CM | POA: Diagnosis not present

## 2018-08-12 DIAGNOSIS — R0789 Other chest pain: Secondary | ICD-10-CM | POA: Diagnosis not present

## 2018-08-12 DIAGNOSIS — R457 State of emotional shock and stress, unspecified: Secondary | ICD-10-CM | POA: Diagnosis not present

## 2018-08-12 DIAGNOSIS — R002 Palpitations: Secondary | ICD-10-CM | POA: Diagnosis not present

## 2018-08-12 DIAGNOSIS — I491 Atrial premature depolarization: Secondary | ICD-10-CM | POA: Diagnosis not present

## 2018-08-12 DIAGNOSIS — F419 Anxiety disorder, unspecified: Secondary | ICD-10-CM | POA: Diagnosis not present

## 2018-08-13 DIAGNOSIS — Z20828 Contact with and (suspected) exposure to other viral communicable diseases: Secondary | ICD-10-CM | POA: Diagnosis not present

## 2018-08-24 ENCOUNTER — Telehealth: Payer: Self-pay | Admitting: Family Medicine

## 2018-08-24 ENCOUNTER — Encounter: Payer: Self-pay | Admitting: Family Medicine

## 2018-08-24 NOTE — Telephone Encounter (Signed)
Spoke with provider who states that patient needs to be seen by our clinical Education officer, museum. She is able to write patient out until her appointment(tenatively 08/30/2018).  Called patient & left voicemail to call back to discuss the above.

## 2018-08-24 NOTE — Telephone Encounter (Signed)
Patient called and asked for a note stating that she can work because of the anxiety. Patient stated that she isn't taken the medication and she said it is because the side affects maker her feel worse

## 2018-08-24 NOTE — Telephone Encounter (Signed)
Patient informed of the information below. She scheduled a telephone visit with clinical social worker on 08/30/2018 @ 2 PM.  She states that she can't afford to be written out of work until Tuesday. I explained to her that this was to give her time to recuperate & take a step back in case the anxiety is affecting her ability to do her job but she doesn't want to be taken out of work. She would like a letter stating that she is ok to work despite having the anxiety. States that per her manager this would suffice.

## 2018-08-24 NOTE — Telephone Encounter (Signed)
Work note completed.

## 2018-08-24 NOTE — Telephone Encounter (Signed)
Patient states that she has a fax number that the letter can be sent to but she is currently driving so she will have to call back to give the number.

## 2018-08-30 ENCOUNTER — Ambulatory Visit: Payer: BC Managed Care – PPO | Admitting: Licensed Clinical Social Worker

## 2018-11-04 ENCOUNTER — Ambulatory Visit: Payer: BLUE CROSS/BLUE SHIELD | Admitting: Allergy

## 2018-11-21 DIAGNOSIS — F410 Panic disorder [episodic paroxysmal anxiety] without agoraphobia: Secondary | ICD-10-CM | POA: Diagnosis not present

## 2018-11-30 DIAGNOSIS — G43719 Chronic migraine without aura, intractable, without status migrainosus: Secondary | ICD-10-CM | POA: Diagnosis not present

## 2018-11-30 DIAGNOSIS — Z049 Encounter for examination and observation for unspecified reason: Secondary | ICD-10-CM | POA: Diagnosis not present

## 2018-12-01 DIAGNOSIS — F410 Panic disorder [episodic paroxysmal anxiety] without agoraphobia: Secondary | ICD-10-CM | POA: Diagnosis not present

## 2018-12-02 DIAGNOSIS — Z681 Body mass index (BMI) 19 or less, adult: Secondary | ICD-10-CM | POA: Diagnosis not present

## 2018-12-02 DIAGNOSIS — U071 COVID-19: Secondary | ICD-10-CM | POA: Diagnosis not present

## 2018-12-08 DIAGNOSIS — F410 Panic disorder [episodic paroxysmal anxiety] without agoraphobia: Secondary | ICD-10-CM | POA: Diagnosis not present

## 2018-12-12 ENCOUNTER — Telehealth: Payer: Self-pay | Admitting: *Deleted

## 2018-12-12 NOTE — Telephone Encounter (Signed)
Patient states she tested + COVID on 10/9. She was tested due to symptoms: nausea, vomiting, diarrhea. Patient has questions about ending her isolation. Reviewed CDC criteria for ending isolation. Patient has met criteria is is ok to resume her normal activity.

## 2018-12-16 DIAGNOSIS — U071 COVID-19: Secondary | ICD-10-CM | POA: Diagnosis not present

## 2018-12-16 DIAGNOSIS — Z681 Body mass index (BMI) 19 or less, adult: Secondary | ICD-10-CM | POA: Diagnosis not present

## 2018-12-19 DIAGNOSIS — F410 Panic disorder [episodic paroxysmal anxiety] without agoraphobia: Secondary | ICD-10-CM | POA: Diagnosis not present

## 2019-01-03 DIAGNOSIS — F410 Panic disorder [episodic paroxysmal anxiety] without agoraphobia: Secondary | ICD-10-CM | POA: Diagnosis not present

## 2019-01-10 DIAGNOSIS — F410 Panic disorder [episodic paroxysmal anxiety] without agoraphobia: Secondary | ICD-10-CM | POA: Diagnosis not present

## 2019-01-17 DIAGNOSIS — F410 Panic disorder [episodic paroxysmal anxiety] without agoraphobia: Secondary | ICD-10-CM | POA: Diagnosis not present

## 2019-01-24 DIAGNOSIS — F410 Panic disorder [episodic paroxysmal anxiety] without agoraphobia: Secondary | ICD-10-CM | POA: Diagnosis not present

## 2019-01-26 IMAGING — CR DG CHEST 2V
2 series · 2 of 2 positions shown · non-contrast
Comparison: 09/04/2017

CLINICAL DATA: Chest pain and heart palpitations

EXAM:
CHEST - 2 VIEW

[chest pa]
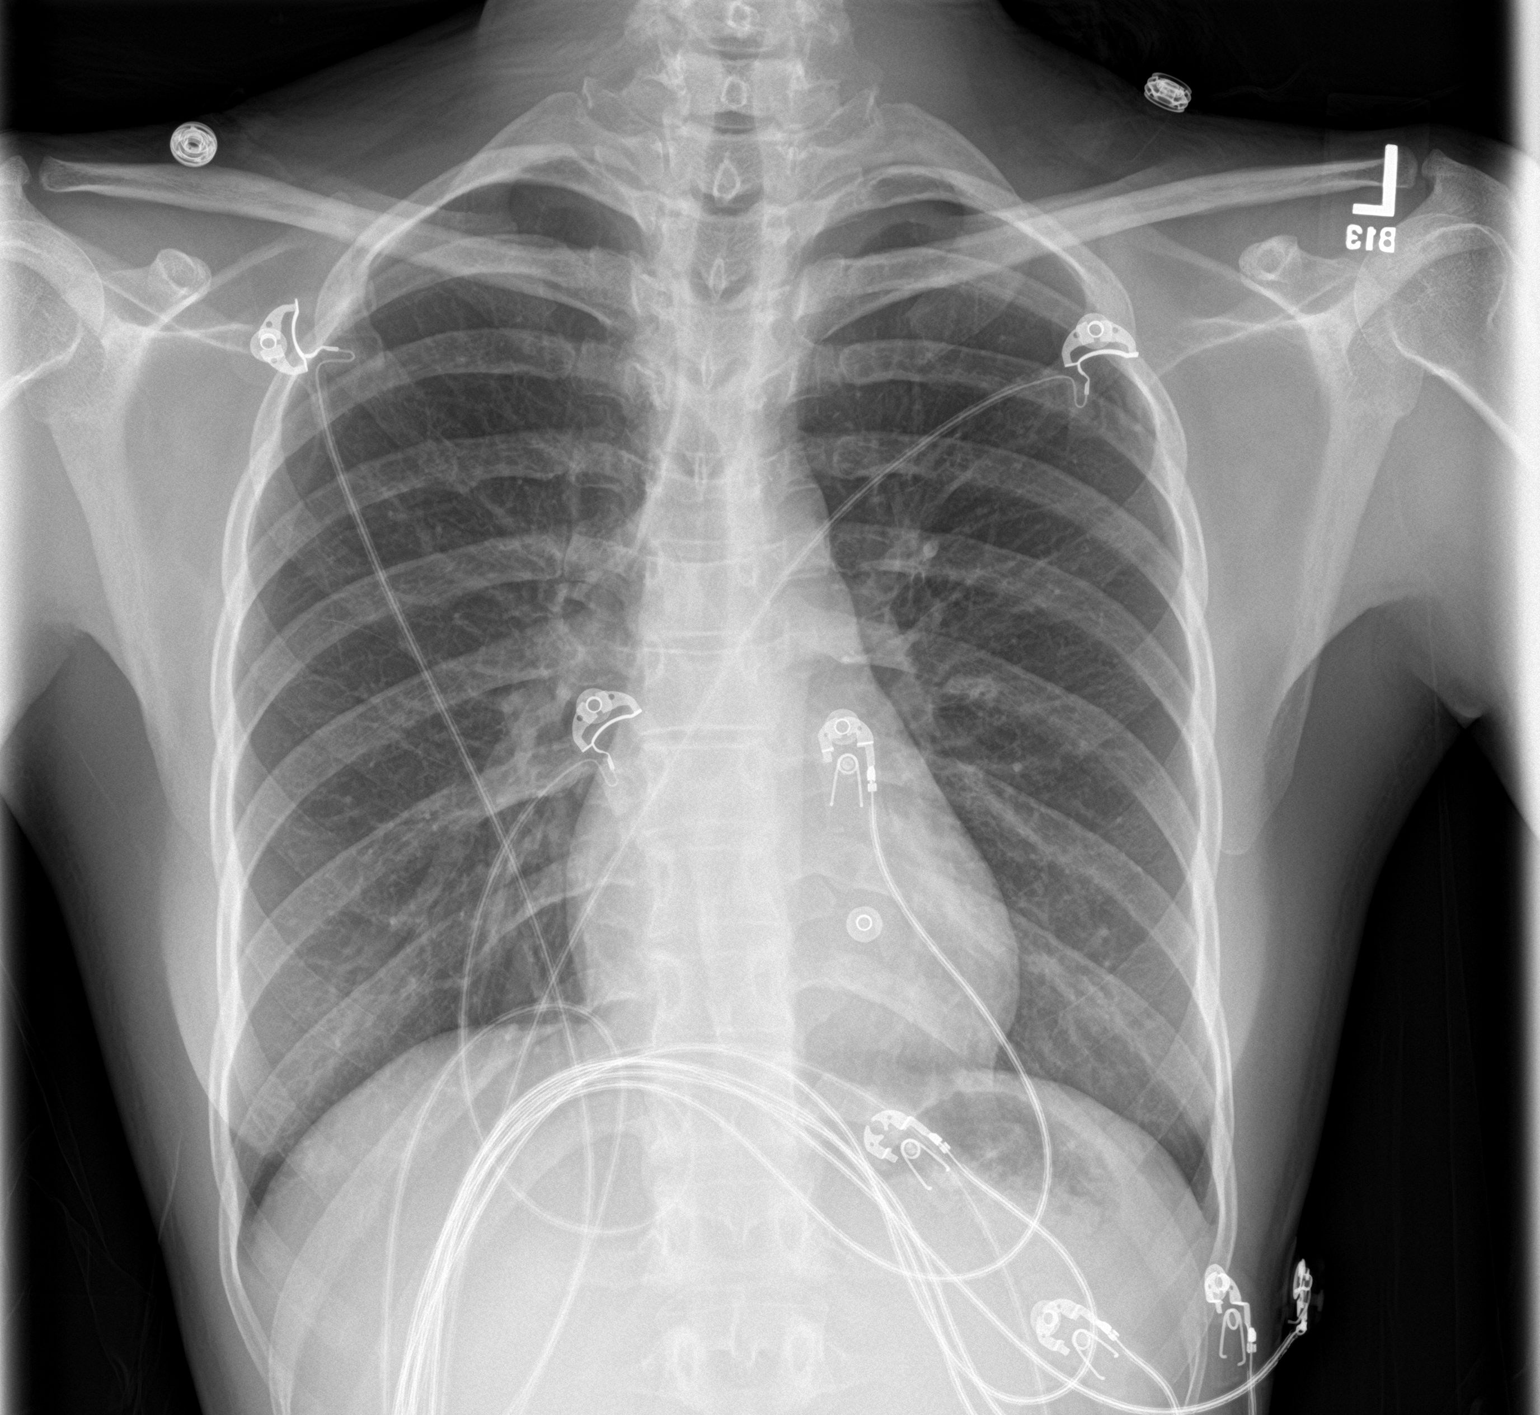

[chest lat]
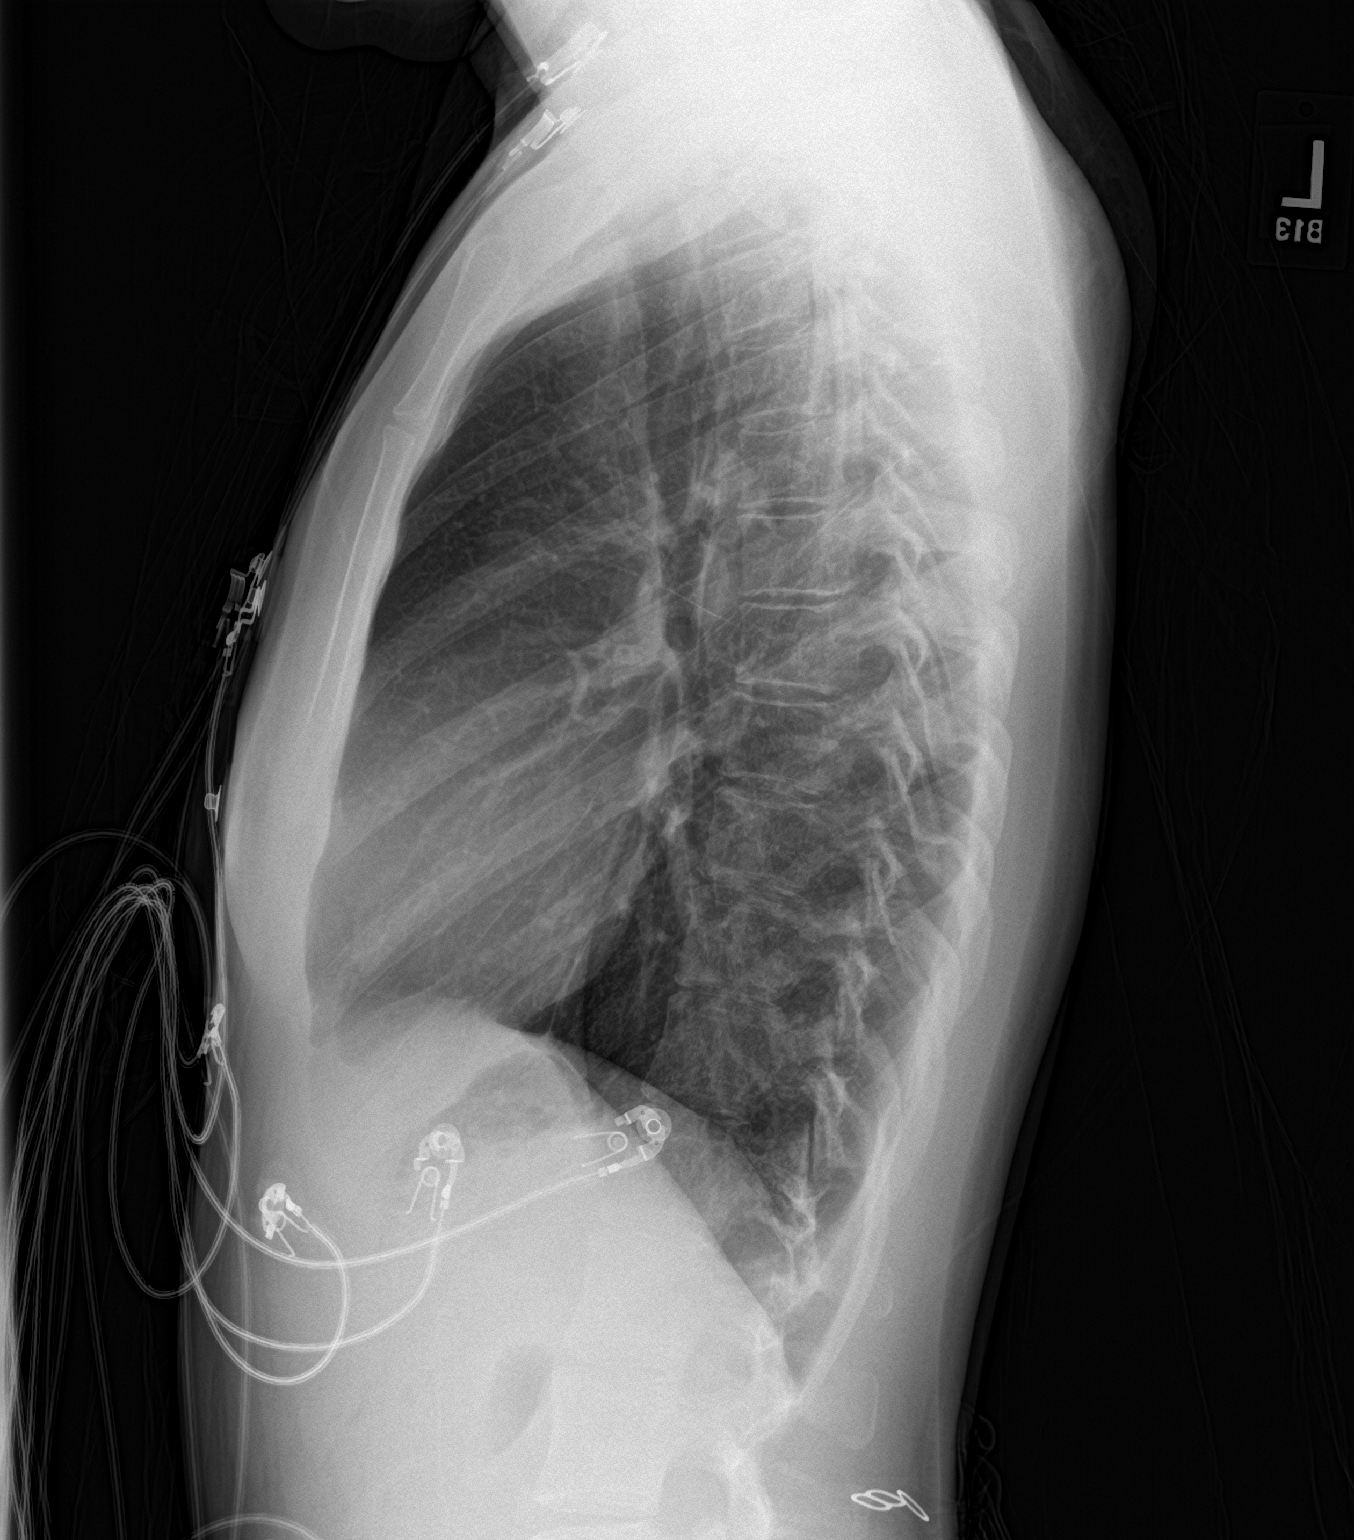

[2 of 2 positions shown; findings below may reference images not displayed]

FINDINGS: Artifact from EKG leads.

Normal heart size and mediastinal contours. No acute infiltrate or
edema. No effusion or pneumothorax. No acute osseous findings.
IMPRESSION: Negative chest.

## 2019-02-07 DIAGNOSIS — F410 Panic disorder [episodic paroxysmal anxiety] without agoraphobia: Secondary | ICD-10-CM | POA: Diagnosis not present

## 2019-02-14 DIAGNOSIS — F410 Panic disorder [episodic paroxysmal anxiety] without agoraphobia: Secondary | ICD-10-CM | POA: Diagnosis not present

## 2019-02-21 DIAGNOSIS — F410 Panic disorder [episodic paroxysmal anxiety] without agoraphobia: Secondary | ICD-10-CM | POA: Diagnosis not present

## 2019-03-28 DIAGNOSIS — L631 Alopecia universalis: Secondary | ICD-10-CM | POA: Diagnosis not present

## 2019-03-28 DIAGNOSIS — L219 Seborrheic dermatitis, unspecified: Secondary | ICD-10-CM | POA: Diagnosis not present

## 2019-03-30 DIAGNOSIS — F410 Panic disorder [episodic paroxysmal anxiety] without agoraphobia: Secondary | ICD-10-CM | POA: Diagnosis not present

## 2019-04-06 DIAGNOSIS — F410 Panic disorder [episodic paroxysmal anxiety] without agoraphobia: Secondary | ICD-10-CM | POA: Diagnosis not present

## 2019-04-13 DIAGNOSIS — F410 Panic disorder [episodic paroxysmal anxiety] without agoraphobia: Secondary | ICD-10-CM | POA: Diagnosis not present

## 2019-04-20 DIAGNOSIS — F410 Panic disorder [episodic paroxysmal anxiety] without agoraphobia: Secondary | ICD-10-CM | POA: Diagnosis not present

## 2019-04-21 DIAGNOSIS — L631 Alopecia universalis: Secondary | ICD-10-CM | POA: Diagnosis not present

## 2019-04-26 DIAGNOSIS — F410 Panic disorder [episodic paroxysmal anxiety] without agoraphobia: Secondary | ICD-10-CM | POA: Diagnosis not present

## 2019-04-27 DIAGNOSIS — F411 Generalized anxiety disorder: Secondary | ICD-10-CM | POA: Diagnosis not present

## 2019-04-27 DIAGNOSIS — R05 Cough: Secondary | ICD-10-CM | POA: Diagnosis not present

## 2019-04-27 DIAGNOSIS — B370 Candidal stomatitis: Secondary | ICD-10-CM | POA: Diagnosis not present

## 2019-04-27 DIAGNOSIS — Z20828 Contact with and (suspected) exposure to other viral communicable diseases: Secondary | ICD-10-CM | POA: Diagnosis not present

## 2019-04-27 DIAGNOSIS — Z113 Encounter for screening for infections with a predominantly sexual mode of transmission: Secondary | ICD-10-CM | POA: Diagnosis not present

## 2019-04-27 DIAGNOSIS — Z5321 Procedure and treatment not carried out due to patient leaving prior to being seen by health care provider: Secondary | ICD-10-CM | POA: Diagnosis not present

## 2019-04-27 DIAGNOSIS — N760 Acute vaginitis: Secondary | ICD-10-CM | POA: Diagnosis not present

## 2019-04-27 DIAGNOSIS — I888 Other nonspecific lymphadenitis: Secondary | ICD-10-CM | POA: Diagnosis not present

## 2019-04-27 DIAGNOSIS — L631 Alopecia universalis: Secondary | ICD-10-CM | POA: Diagnosis not present

## 2019-04-27 DIAGNOSIS — F410 Panic disorder [episodic paroxysmal anxiety] without agoraphobia: Secondary | ICD-10-CM | POA: Diagnosis not present

## 2019-04-27 DIAGNOSIS — B373 Candidiasis of vulva and vagina: Secondary | ICD-10-CM | POA: Diagnosis not present

## 2019-04-27 DIAGNOSIS — R197 Diarrhea, unspecified: Secondary | ICD-10-CM | POA: Diagnosis not present

## 2019-04-27 DIAGNOSIS — Z202 Contact with and (suspected) exposure to infections with a predominantly sexual mode of transmission: Secondary | ICD-10-CM | POA: Diagnosis not present

## 2019-05-08 ENCOUNTER — Ambulatory Visit: Payer: BC Managed Care – PPO

## 2019-05-09 ENCOUNTER — Ambulatory Visit (INDEPENDENT_AMBULATORY_CARE_PROVIDER_SITE_OTHER): Payer: BC Managed Care – PPO | Admitting: Internal Medicine

## 2019-05-09 DIAGNOSIS — F411 Generalized anxiety disorder: Secondary | ICD-10-CM | POA: Diagnosis not present

## 2019-05-09 NOTE — Progress Notes (Signed)
Virtual Visit via Telephone Note Due to current restrictions/limitations of in-office visits due to the COVID-19 pandemic, this scheduled clinical appointment was converted to a telehealth visit  I connected with Faith Ross on 05/09/19 at 9:03 a.m by telephone and verified that I am speaking with the correct person using two identifiers. I am in my office.  The patient is at home.  Only the patient and myself participated in this encounter.  I discussed the limitations, risks, security and privacy concerns of performing an evaluation and management service by telephone and the availability of in person appointments. I also discussed with the patient that there may be a patient responsible charge related to this service. The patient expressed understanding and agreed to proceed.   History of Present Illness: Gives hx of anxiety.  It had worsen. Had difficult time getting in to see [place] so went to [facility].  Sees therapist there once a wk.  Met with psychiatrist via telephone visit and was prescribed Lexapro and Hydroxyzine. Just started the Lexapro a few days ago.  She denies any history of depression. Overdue for several health maintenance including Pap and some of her vaccines.  She is agreeable to being schedule to have this done.  Outpatient Encounter Medications as of 05/09/2019  Medication Sig  . fluticasone (FLONASE) 50 MCG/ACT nasal spray Place 2 sprays into both nostrils 2 (two) times a day.  . escitalopram (LEXAPRO) 10 MG tablet Take 10 mg by mouth daily.  . hydrOXYzine (VISTARIL) 25 MG capsule Take 25-50 mg by mouth daily as needed.  . [DISCONTINUED] ipratropium (ATROVENT) 0.03 % nasal spray Place 2 sprays into both nostrils 3 (three) times daily as needed for rhinitis.  . [DISCONTINUED] levocetirizine (XYZAL) 5 MG tablet Take 1 tablet (5 mg total) by mouth every evening. (Patient not taking: Reported on 07/27/2018)  . [DISCONTINUED] levocetirizine (XYZAL) 5 MG tablet Take 1 tablet (5 mg  total) by mouth every evening.  . [DISCONTINUED] montelukast (SINGULAIR) 10 MG tablet Take 1 tablet (10 mg total) by mouth at bedtime.  . [DISCONTINUED] Olopatadine HCl (PAZEO) 0.7 % SOLN Apply 1 drop to eye daily.   No facility-administered encounter medications on file as of 05/09/2019.    Observations/Objective: Depression screen [facility] 2/9 05/09/2019 05/05/2018 12/01/2017  Decreased Interest 0 2 0  Down, Depressed, Hopeless 0 2 0  PHQ - 2 Score 0 4 0  Altered sleeping '2 3 1  '$ Tired, decreased energy 2 3 0  Change in appetite 0 1 0  Feeling bad or failure about yourself  0 2 0  Trouble concentrating 0 3 0  Moving slowly or fidgety/restless 0 0 0  Suicidal thoughts 0 0 0  PHQ-9 Score '4 16 1   '$ GAD 7 : Generalized Anxiety Score 05/09/2019 05/05/2018 12/01/2017  Nervous, Anxious, on Edge '2 3 1  '$ Control/stop worrying '1 3 1  '$ Worry too much - different things '1 3 1  '$ Trouble relaxing '2 3 1  '$ Restless 1 3 0  Easily annoyed or irritable 1 3 0  Afraid - awful might happen '1 3 1  '$ Total GAD 7 Score '9 21 5    '$ Assessment and Plan: generalized anxiety disorder -Patient currently plugged into mental health services. She will let her mental health provider know if she experiences any side effects from the medications.   Follow Up Instructions: 6 wks for well woman exam with new provider at [facility].   I discussed the assessment and treatment plan with the patient. The  patient was provided an opportunity to ask questions and all were answered. The patient agreed with the plan and demonstrated an understanding of the instructions.   The patient was advised to call back or seek an in-person evaluation if the symptoms worsen or if the condition fails to improve as anticipated.  I provided 9 minutes of non-face-to-face time during this encounter.   Karle Plumber, MD

## 2019-05-13 ENCOUNTER — Encounter (HOSPITAL_COMMUNITY): Payer: Self-pay

## 2019-05-13 ENCOUNTER — Emergency Department (HOSPITAL_COMMUNITY)
Admission: EM | Admit: 2019-05-13 | Discharge: 2019-05-13 | Disposition: A | Discharge status: Home / Self Care | Payer: BC Managed Care – PPO | Attending: Emergency Medicine | Admitting: Emergency Medicine

## 2019-05-13 ENCOUNTER — Other Ambulatory Visit: Payer: Self-pay

## 2019-05-13 DIAGNOSIS — Z202 Contact with and (suspected) exposure to infections with a predominantly sexual mode of transmission: Secondary | ICD-10-CM | POA: Diagnosis not present

## 2019-05-13 DIAGNOSIS — Z5321 Procedure and treatment not carried out due to patient leaving prior to being seen by health care provider: Secondary | ICD-10-CM | POA: Insufficient documentation

## 2019-05-13 DIAGNOSIS — N760 Acute vaginitis: Secondary | ICD-10-CM | POA: Diagnosis not present

## 2019-05-13 DIAGNOSIS — R197 Diarrhea, unspecified: Secondary | ICD-10-CM | POA: Diagnosis not present

## 2019-05-13 DIAGNOSIS — B370 Candidal stomatitis: Secondary | ICD-10-CM | POA: Diagnosis not present

## 2019-05-13 DIAGNOSIS — Z113 Encounter for screening for infections with a predominantly sexual mode of transmission: Secondary | ICD-10-CM | POA: Diagnosis not present

## 2019-05-13 NOTE — ED Notes (Signed)
Called Patient and was notified patient left

## 2019-05-13 NOTE — ED Triage Notes (Signed)
Arrived POV patient reports she thinks she may have STI. Patient reports more stools lately but not diarrhea and a rash on her tongue.

## 2019-05-15 ENCOUNTER — Encounter: Payer: Self-pay | Admitting: Internal Medicine

## 2019-05-15 ENCOUNTER — Ambulatory Visit (INDEPENDENT_AMBULATORY_CARE_PROVIDER_SITE_OTHER): Payer: BC Managed Care – PPO | Admitting: Internal Medicine

## 2019-05-15 DIAGNOSIS — R05 Cough: Secondary | ICD-10-CM

## 2019-05-15 DIAGNOSIS — R059 Cough, unspecified: Secondary | ICD-10-CM

## 2019-05-15 MED ORDER — BENZONATATE 100 MG PO CAPS: 100.0000 mg | ORAL_CAPSULE | Freq: Two times a day (BID) | ORAL | 0 refills | Status: DC | PRN

## 2019-05-15 NOTE — Progress Notes (Signed)
Virtual Visit via Telephone Note  I connected with Faith Ross, on 05/15/2019 at 1:54 PM by telephone due to the COVID-19 pandemic and verified that I am speaking with the correct person using two identifiers.   Consent: I discussed the limitations, risks, security and privacy concerns of performing an evaluation and management service by telephone and the availability of in person appointments. I also discussed with the patient that there may be a patient responsible charge related to this service. The patient expressed understanding and agreed to proceed.   Location of Patient: Home   Location of Provider: Clinic    Persons participating in Telemedicine visit: Kya Rufina Kimery [facility] Dr. Earlene Plater      History of Present Illness: Patient has a visit for a cough. Has been occurring for 2 weeks. Reports it started when she was taking Prednisone and Clindamycin for lymphadenopathy. She reports she stopped these medications last week. She is coughing all day long, it is dry in nature. She was COVID+ in Oct, she recovered fully from that. Was tested last week for COVID in urgent care and was negative. She denies other associated symptoms like nasal congestion, sinus pressure, sore throat, ear pain. She does report that she does has really bad allergies. She does have a Rx for Flonase but only uses as needed and very infrequently. Her floor also has mildew underneath it and they haven't come out to change her flooring.    Past Medical History:  Diagnosis Date  . Alopecia   . Anxiety   . Chest wall pain    No Known Allergies  Current Outpatient Medications on File Prior to Visit  Medication Sig Dispense Refill  . escitalopram (LEXAPRO) 10 MG tablet Take 10 mg by mouth daily.    . fluticasone (FLONASE) 50 MCG/ACT nasal spray Place 2 sprays into both nostrils 2 (two) times a day. 16 g 5  . hydrOXYzine (VISTARIL) 25 MG capsule Take 25-50 mg by mouth daily as needed.    .  nystatin (MYCOSTATIN) 100000 UNIT/ML suspension Take 5 mLs by mouth 4 (four) times daily.    . vancomycin (VANCOCIN) 125 MG capsule Take 125 mg by mouth 4 (four) times daily.     No current facility-administered medications on file prior to visit.    Observations/Objective: NAD. Speaking clearly.  Work of breathing normal.  Alert and oriented. Mood appropriate.   Assessment and Plan: 1. Cough Suspect related to seasonal allergic rhinitis and possibly worsened by environmental trigger of reported mildew in the home. Discussed need to use Flonase daily to limit allergic symptoms. Also recommend OTC oral anti histamine. Will prescribe Tessalon for cough suppression. Discussed reasons to seek emergency care.  - benzonatate (TESSALON) 100 MG capsule; Take 1 capsule (100 mg total) by mouth 2 (two) times daily as needed for cough.  Dispense: 30 capsule; Refill: 0   Follow Up Instructions: Annual exam scheduled in May 2021, PRN if no improvement    I discussed the assessment and treatment plan with the patient. The patient was provided an opportunity to ask questions and all were answered. The patient agreed with the plan and demonstrated an understanding of the instructions.   The patient was advised to call back or seek an in-person evaluation if the symptoms worsen or if the condition fails to improve as anticipated.     I provided 12 minutes total of non-face-to-face time during this encounter including median intraservice time, reviewing previous notes, investigations, ordering medications, medical decision making, coordinating  care and patient verbalized understanding at the end of the visit.    Phill Myron, D.O. Primary Care at [facility]  05/15/2019, 1:54 PM

## 2019-05-24 ENCOUNTER — Telehealth: Payer: Self-pay | Admitting: Family Medicine

## 2019-05-24 NOTE — Telephone Encounter (Signed)
Spoke with patient. She states that she is concerned about the possible side effects listed on the paperwork she was given from the pharmacy for the [employer].  She states that she read that it can cause dizziness, irregular pulse, headaches, fatigue, nausea, constipation. She has not started taking them yet.

## 2019-05-24 NOTE — Telephone Encounter (Signed)
Patient calling in regards to side effects to medications. She is very concerned and would like to hear from you and see if it's possible to switch them.  She also would like to have a liquid form of cough medicine rather than the pill.

## 2019-05-24 NOTE — Telephone Encounter (Signed)
Would you mind calling her to see what side effects she is having? If she is referring to her Lexapro and wants to switch those type of medications we will need an office visit to do that.   Liquid cough syrups that are prescription tend to have narcotics in them and I can't prescribe that without seeing her in person. If she prefers a liquid cough suppressant would recommend OTC Robitussin or a store brand equivalent.    Thanks!   Marcy Siren, D.O. Primary Care at [facility]  05/24/2019, 4:34 PM

## 2019-05-24 NOTE — Telephone Encounter (Signed)
Most people take Tessalon with no to little side effects. It is typically a very benign medication. I would recommend she start taking it if she feels comfortable and is still bothered by her cough.   Marcy Siren, D.O. Primary Care at [facility]  05/24/2019, 5:28 PM

## 2019-05-25 NOTE — Telephone Encounter (Signed)
Left voice mail to call back 

## 2019-05-30 ENCOUNTER — Ambulatory Visit: Payer: BC Managed Care – PPO | Admitting: Obstetrics

## 2019-05-30 NOTE — Telephone Encounter (Signed)
Left voice mail to call back 

## 2019-06-01 DIAGNOSIS — F410 Panic disorder [episodic paroxysmal anxiety] without agoraphobia: Secondary | ICD-10-CM | POA: Diagnosis not present

## 2019-06-02 DIAGNOSIS — N760 Acute vaginitis: Secondary | ICD-10-CM | POA: Diagnosis not present

## 2019-06-05 ENCOUNTER — Other Ambulatory Visit (HOSPITAL_COMMUNITY)
Admission: RE | Admit: 2019-06-05 | Discharge: 2019-06-05 | Disposition: A | Discharge status: Home / Self Care | Payer: BC Managed Care – PPO | Source: Ambulatory Visit | Attending: Obstetrics and Gynecology | Admitting: Obstetrics and Gynecology

## 2019-06-05 ENCOUNTER — Other Ambulatory Visit: Payer: Self-pay

## 2019-06-05 ENCOUNTER — Encounter: Payer: Self-pay | Admitting: Obstetrics and Gynecology

## 2019-06-05 ENCOUNTER — Encounter: Payer: BC Managed Care – PPO | Admitting: Obstetrics and Gynecology

## 2019-06-05 ENCOUNTER — Ambulatory Visit (INDEPENDENT_AMBULATORY_CARE_PROVIDER_SITE_OTHER): Payer: BC Managed Care – PPO | Admitting: Obstetrics and Gynecology

## 2019-06-05 VITALS — BP 120/62 | HR 82 | Temp 98.4°F | Ht 66.5 in | Wt 121.4 lb

## 2019-06-05 DIAGNOSIS — Z3009 Encounter for other general counseling and advice on contraception: Secondary | ICD-10-CM

## 2019-06-05 DIAGNOSIS — Z124 Encounter for screening for malignant neoplasm of cervix: Secondary | ICD-10-CM

## 2019-06-05 DIAGNOSIS — Z0000 Encounter for general adult medical examination without abnormal findings: Secondary | ICD-10-CM

## 2019-06-05 DIAGNOSIS — N760 Acute vaginitis: Secondary | ICD-10-CM

## 2019-06-05 DIAGNOSIS — R350 Frequency of micturition: Secondary | ICD-10-CM

## 2019-06-05 DIAGNOSIS — Z01419 Encounter for gynecological examination (general) (routine) without abnormal findings: Secondary | ICD-10-CM | POA: Diagnosis not present

## 2019-06-05 DIAGNOSIS — R3915 Urgency of urination: Secondary | ICD-10-CM

## 2019-06-05 DIAGNOSIS — D72819 Decreased white blood cell count, unspecified: Secondary | ICD-10-CM

## 2019-06-05 MED ORDER — BETAMETHASONE VALERATE 0.1 % EX OINT: TOPICAL_OINTMENT | CUTANEOUS | 0 refills | Status: DC

## 2019-06-05 NOTE — Progress Notes (Signed)
29 y.o. [id number] Single Black or African American Not Hispanic or Latino female here for annual exam. She says that she is having itching and pain in her vaginal area.  The itching is bad, started 1.5 weeks ago, no vaginal discharge. She has frequent urination, urinary urgency for the last 1.5 weeks. No dysuria.  Sexually active, same partner x 6 months. Using condoms. She was on depo-provera, last shot was sometime last year. She would like to restart it, did well on it.  Cycles have been regular in the last few months. Cramps can be 8/10 in severity. Some help with ibuprofen.  Period Cycle (Days): 28 Period Duration (Days): 5-6 Period Pattern: Regular Menstrual Flow: Heavy Menstrual Control: Tampon, Maxi pad Menstrual Control Change Freq (Hours): 5-8 Dysmenorrhea: (!) Mild Dysmenorrhea Symptoms: Cramping, Nausea  Patient's last menstrual period was 05/14/2019.          Sexually active: Yes.    The current method of family planning is condoms all the time.    Exercising: No.  The patient does not participate in regular exercise at present. Smoker:  no  Health Maintenance: Pap:  2019 normal +HPV  History of abnormal Pap:  yes MMG:  None  BMD:   none Colonoscopy: none TDaP: unsure, UTD  Gardasil: no, information given    reports that she has quit smoking. She has never used smokeless tobacco. She reports previous alcohol use. She reports that she does not use drugs. No ETOH. She works in [employer].   Past Medical History:  Diagnosis Date  . Alopecia   . Anxiety   . Chest wall pain     Past Surgical History:  Procedure Laterality Date  . TONSILLECTOMY      No current outpatient medications on file.   No current facility-administered medications for this visit.    Family History  Problem Relation Age of Onset  . Thyroid disease Mother     Review of Systems  Constitutional: Negative.   HENT: Negative.   Eyes: Negative.   Respiratory: Negative.   Cardiovascular:  Negative.   Gastrointestinal: Negative.   Endocrine: Negative.   Genitourinary: Positive for vaginal pain.  Musculoskeletal: Negative.   Skin: Negative.   Allergic/Immunologic: Negative.   Neurological: Negative.   Hematological: Negative.   Psychiatric/Behavioral: Negative.     Exam:   BP 120/62   Pulse 82   Temp 98.4 F (36.9 C)   Ht 5' 6.5" (1.689 m)   Wt 121 lb 6.4 oz (55.1 kg)   LMP 05/14/2019   SpO2 100%   BMI 19.30 kg/m   Weight change: @WEIGHTCHANGE @ Height:   Height: 5' 6.5" (168.9 cm)  Ht Readings from Last 3 Encounters:  06/05/19 5' 6.5" (1.689 m)  05/13/19 5\' 6"  (1.676 m)  07/27/18 5\' 5"  (1.651 m)    General appearance: alert, cooperative and appears stated age Head: Normocephalic, without obvious abnormality, atraumatic Neck: no adenopathy, supple, symmetrical, trachea midline and thyroid normal to inspection and palpation Lungs: clear to auscultation bilaterally Cardiovascular: regular rate and rhythm Breasts: normal appearance, no masses or tenderness Abdomen: soft, non-tender; non distended,  no masses,  no organomegaly Extremities: extremities normal, atraumatic, no cyanosis or edema Skin: Skin color, texture, turgor normal. No rashes or lesions Lymph nodes: Cervical, supraclavicular, and axillary nodes normal. No abnormal inguinal nodes palpated Neurologic: Grossly normal   Pelvic: External genitalia:  no lesions              Urethra:  normal appearing urethra  with no masses, tenderness or lesions              Bartholins and Skenes: normal                 Vagina: normal appearing vagina with slight erythema and a slight increase in watery, white vaginal discharge              Cervix: no cervical motion tenderness and no lesions               Bimanual Exam:  Uterus:  normal size, contour, position, consistency, mobility, non-tender              Adnexa: no mass, fullness, tenderness               Rectovaginal: Confirms               Anus:  normal  sphincter tone, no lesions  Carolynn Serve chaperoned for the exam.  Wet prep: no clue, no trich, + wbc KOH: no yeast PH: 4.5   A:  Well Woman with normal exam  Urinary frequency, urinary urgency  Vaginitis, negative slides  Recent negative STD testing  Contraception, wants to restart depo-provera.   H/O leukopenia  P:   Pap with hpv  Nuswab, treatment depending on results  Valisone for vulvar itching  Send urine for ua, c&s  CBC with diff, CMP, lipid panel  Recent negative std testing  Return for depo-provera 150 mg IM with her cycle  Gardasil information given

## 2019-06-05 NOTE — Patient Instructions (Addendum)
EXERCISE AND DIET:  We recommended that you start or continue a regular exercise program for good health. Regular exercise means any activity that makes your heart beat faster and makes you sweat.  We recommend exercising at least 30 minutes per day at least 3 days a week, preferably 4 or 5.  We also recommend a diet low in fat and sugar.  Inactivity, poor dietary choices and obesity can cause diabetes, heart attack, stroke, and kidney damage, among others.    ALCOHOL AND SMOKING:  Women should limit their alcohol intake to no more than 7 drinks/beers/glasses of wine (combined, not each!) per week. Moderation of alcohol intake to this level decreases your risk of breast cancer and liver damage. And of course, no recreational drugs are part of a healthy lifestyle.  And absolutely no smoking or even second hand smoke. Most people know smoking can cause heart and lung diseases, but did you know it also contributes to weakening of your bones? Aging of your skin?  Yellowing of your teeth and nails?  CALCIUM AND VITAMIN D:  Adequate intake of calcium and Vitamin D are recommended.  The recommendations for exact amounts of these supplements seem to change often, but generally speaking 1,000 mg of calcium (between diet and supplement) and 800 units of Vitamin D per day seems prudent. Certain women may benefit from higher intake of Vitamin D.  If you are among these women, your doctor will have told you during your visit.    PAP SMEARS:  Pap smears, to check for cervical cancer or precancers,  have traditionally been done yearly, although recent scientific advances have shown that most women can have pap smears less often.  However, every woman still should have a physical exam from her gynecologist every year. It will include a breast check, inspection of the vulva and vagina to check for abnormal growths or skin changes, a visual exam of the cervix, and then an exam to evaluate the size and shape of the uterus and  ovaries.  And after 30 years of age, a rectal exam is indicated to check for rectal cancers. We will also provide age appropriate advice regarding health maintenance, like when you should have certain vaccines, screening for sexually transmitted diseases, bone density testing, colonoscopy, mammograms, etc.   MAMMOGRAMS:  All women over 40 years old should have a yearly mammogram. Many facilities now offer a "3D" mammogram, which may cost around $50 extra out of pocket. If possible,  we recommend you accept the option to have the 3D mammogram performed.  It both reduces the number of women who will be called back for extra views which then turn out to be normal, and it is better than the routine mammogram at detecting truly abnormal areas.    COLON CANCER SCREENING: Now recommend starting at age 45. At this time colonoscopy is not covered for routine screening until 50. There are take home tests that can be done between 45-49.   COLONOSCOPY:  Colonoscopy to screen for colon cancer is recommended for all women at age 50.  We know, you hate the idea of the prep.  We agree, BUT, having colon cancer and not knowing it is worse!!  Colon cancer so often starts as a polyp that can be seen and removed at colonscopy, which can quite literally save your life!  And if your first colonoscopy is normal and you have no family history of colon cancer, most women don't have to have it again for   10 years.  Once every ten years, you can do something that may end up saving your life, right?  We will be happy to help you get it scheduled when you are ready.  Be sure to check your insurance coverage so you understand how much it will cost.  It may be covered as a preventative service at no cost, but you should check your particular policy.      Breast Self-Awareness Breast self-awareness means being familiar with how your breasts look and feel. It involves checking your breasts regularly and reporting any changes to your  health care provider. Practicing breast self-awareness is important. A change in your breasts can be a sign of a serious medical problem. Being familiar with how your breasts look and feel allows you to find any problems early, when treatment is more likely to be successful. All women should practice breast self-awareness, including women who have had breast implants. How to do a breast self-exam One way to learn what is normal for your breasts and whether your breasts are changing is to do a breast self-exam. To do a breast self-exam: Look for Changes  1. Remove all the clothing above your waist. 2. Stand in front of a mirror in a room with good lighting. 3. Put your hands on your hips. 4. Push your hands firmly downward. 5. Compare your breasts in the mirror. Look for differences between them (asymmetry), such as: ? Differences in shape. ? Differences in size. ? Puckers, dips, and bumps in one breast and not the other. 6. Look at each breast for changes in your skin, such as: ? Redness. ? Scaly areas. 7. Look for changes in your nipples, such as: ? Discharge. ? Bleeding. ? Dimpling. ? Redness. ? A change in position. Feel for Changes Carefully feel your breasts for lumps and changes. It is best to do this while lying on your back on the floor and again while sitting or standing in the shower or tub with soapy water on your skin. Feel each breast in the following way:  Place the arm on the side of the breast you are examining above your head.  Feel your breast with the other hand.  Start in the nipple area and make  inch (2 cm) overlapping circles to feel your breast. Use the pads of your three middle fingers to do this. Apply light pressure, then medium pressure, then firm pressure. The light pressure will allow you to feel the tissue closest to the skin. The medium pressure will allow you to feel the tissue that is a little deeper. The firm pressure will allow you to feel the tissue  close to the ribs.  Continue the overlapping circles, moving downward over the breast until you feel your ribs below your breast.  Move one finger-width toward the center of the body. Continue to use the  inch (2 cm) overlapping circles to feel your breast as you move slowly up toward your collarbone.  Continue the up and down exam using all three pressures until you reach your armpit.  Write Down What You Find  Write down what is normal for each breast and any changes that you find. Keep a written record with breast changes or normal findings for each breast. By writing this information down, you do not need to depend only on memory for size, tenderness, or location. Write down where you are in your menstrual cycle, if you are still menstruating. If you are having trouble noticing differences   in your breasts, do not get discouraged. With time you will become more familiar with the variations in your breasts and more comfortable with the exam. How often should I examine my breasts? Examine your breasts every month. If you are breastfeeding, the best time to examine your breasts is after a feeding or after using a breast pump. If you menstruate, the best time to examine your breasts is 5-7 days after your period is over. During your period, your breasts are lumpier, and it may be more difficult to notice changes. When should I see my health care provider? See your health care provider if you notice:  A change in shape or size of your breasts or nipples.  A change in the skin of your breast or nipples, such as a reddened or scaly area.  Unusual discharge from your nipples.  A lump or thick area that was not there before.  Pain in your breasts.  Anything that concerns you.  Vaginitis Vaginitis is a condition in which the vaginal tissue swells and becomes red (inflamed). This condition is most often caused by a change in the normal balance of bacteria and yeast that live in the vagina.  This change causes an overgrowth of certain bacteria or yeast, which causes the inflammation. There are different types of vaginitis, but the most common types are:  Bacterial vaginosis.  Yeast infection (candidiasis).  Trichomoniasis vaginitis. This is a sexually transmitted disease (STD).  Viral vaginitis.  Atrophic vaginitis.  Allergic vaginitis. What are the causes? The cause of this condition depends on the type of vaginitis. It can be caused by:  Bacteria (bacterial vaginosis).  Yeast, which is a fungus (yeast infection).  A parasite (trichomoniasis vaginitis).  A virus (viral vaginitis).  Low hormone levels (atrophic vaginitis). Low hormone levels can occur during pregnancy, breastfeeding, or after menopause.  Irritants, such as bubble baths, scented tampons, and feminine sprays (allergic vaginitis). Other factors can change the normal balance of the yeast and bacteria that live in the vagina. These include:  Antibiotic medicines.  Poor hygiene.  Diaphragms, vaginal sponges, spermicides, birth control pills, and intrauterine devices (IUD).  Sex.  Infection.  Uncontrolled diabetes.  A weakened defense (immune) system. What increases the risk? This condition is more likely to develop in women who:  Smoke.  Use vaginal douches, scented tampons, or scented sanitary pads.  Wear tight-fitting pants.  Wear thong underwear.  Use oral birth control pills or an IUD.  Have sex without a condom.  Have multiple sex partners.  Have an STD.  Frequently use the spermicide nonoxynol-9.  Eat lots of foods high in sugar.  Have uncontrolled diabetes.  Have low estrogen levels.  Have a weakened immune system from an immune disorder or medical treatment.  Are pregnant or breastfeeding. What are the signs or symptoms? Symptoms vary depending on the cause of the vaginitis. Common symptoms include:  Abnormal vaginal discharge. ? The discharge is white, gray,  or yellow with bacterial vaginosis. ? The discharge is thick, white, and cheesy with a yeast infection. ? The discharge is frothy and yellow or greenish with trichomoniasis.  A bad vaginal smell. The smell is fishy with bacterial vaginosis.  Vaginal itching, pain, or swelling.  Sex that is painful.  Pain or burning when urinating. Sometimes there are no symptoms. How is this diagnosed? This condition is diagnosed based on your symptoms and medical history. A physical exam, including a pelvic exam, will also be done. You may also have other   tests, including:  Tests to determine the pH level (acidity or alkalinity) of your vagina.  A whiff test, to assess the odor that results when a sample of your vaginal discharge is mixed with a potassium hydroxide solution.  Tests of vaginal fluid. A sample will be examined under a microscope. How is this treated? Treatment varies depending on the type of vaginitis you have. Your treatment may include:  Antibiotic creams or pills to treat bacterial vaginosis and trichomoniasis.  Antifungal medicines, such as vaginal creams or suppositories, to treat a yeast infection.  Medicine to ease discomfort if you have viral vaginitis. Your sexual partner should also be treated.  Estrogen delivered in a cream, pill, suppository, or vaginal ring to treat atrophic vaginitis. If vaginal dryness occurs, lubricants and moisturizing creams may help. You may need to avoid scented soaps, sprays, or douches.  Stopping use of a product that is causing allergic vaginitis. Then using a vaginal cream to treat the symptoms. Follow these instructions at home: Lifestyle  Keep your genital area clean and dry. Avoid soap, and only rinse the area with water.  Do not douche or use tampons until your health care provider says it is okay to do so. Use sanitary pads, if needed.  Do not have sex until your health care provider approves. When you can return to sex, practice  safe sex and use condoms.  Wipe from front to back. This avoids the spread of bacteria from the rectum to the vagina. General instructions  Take over-the-counter and prescription medicines only as told by your health care provider.  If you were prescribed an antibiotic medicine, take or use it as told by your health care provider. Do not stop taking or using the antibiotic even if you start to feel better.  Keep all follow-up visits as told by your health care provider. This is important. How is this prevented?  Use mild, non-scented products. Do not use things that can irritate the vagina, such as fabric softeners. Avoid the following products if they are scented: ? Feminine sprays. ? Detergents. ? Tampons. ? Feminine hygiene products. ? Soaps or bubble baths.  Let air reach your genital area. ? Wear cotton underwear to reduce moisture buildup. ? Avoid wearing underwear while you sleep. ? Avoid wearing tight pants and underwear or nylons without a cotton panel. ? Avoid wearing thong underwear.  Take off any wet clothing, such as bathing suits, as soon as possible.  Practice safe sex and use condoms. Contact a health care provider if:  You have abdominal pain.  You have a fever.  You have symptoms that last for more than 2-3 days. Get help right away if:  You have a fever and your symptoms suddenly get worse. Summary  Vaginitis is a condition in which the vaginal tissue becomes inflamed.This condition is most often caused by a change in the normal balance of bacteria and yeast that live in the vagina.  Treatment varies depending on the type of vaginitis you have.  Do not douche, use tampons , or have sex until your health care provider approves. When you can return to sex, practice safe sex and use condoms. This information is not intended to replace advice given to you by your health care provider. Make sure you discuss any questions you have with your health care  provider. Document Revised: 01/22/2017 Document Reviewed: 03/17/2016 Elsevier Patient Education  2020 [employer] Inc[employer]  

## 2019-06-06 DIAGNOSIS — B370 Candidal stomatitis: Secondary | ICD-10-CM | POA: Diagnosis not present

## 2019-06-06 LAB — CBC WITH DIFFERENTIAL/PLATELET
Basophils Absolute: 0 10*3/uL (ref 0.0–0.2)
Basos: 0 %
EOS (ABSOLUTE): 0 10*3/uL (ref 0.0–0.4)
Eos: 1 %
Hematocrit: 31.4 % — ABNORMAL LOW (ref 34.0–46.6)
Hemoglobin: 10.8 g/dL — ABNORMAL LOW (ref 11.1–15.9)
Immature Grans (Abs): 0 10*3/uL (ref 0.0–0.1)
Immature Granulocytes: 0 %
Lymphocytes Absolute: 1 10*3/uL (ref 0.7–3.1)
Lymphs: 36 %
MCH: 30.3 pg (ref 26.6–33.0)
MCHC: 34.4 g/dL (ref 31.5–35.7)
MCV: 88 fL (ref 79–97)
Monocytes Absolute: 0.3 10*3/uL (ref 0.1–0.9)
Monocytes: 11 %
Neutrophils Absolute: 1.5 10*3/uL (ref 1.4–7.0)
Neutrophils: 52 %
Platelets: 239 10*3/uL (ref 150–450)
RBC: 3.56 x10E6/uL — ABNORMAL LOW (ref 3.77–5.28)
RDW: 12.6 % (ref 11.7–15.4)
WBC: 2.8 10*3/uL — ABNORMAL LOW (ref 3.4–10.8)

## 2019-06-06 LAB — COMPREHENSIVE METABOLIC PANEL
ALT: 12 IU/L (ref 0–32)
AST: 17 IU/L (ref 0–40)
Albumin/Globulin Ratio: 1.1 — ABNORMAL LOW (ref 1.2–2.2)
Albumin: 4 g/dL (ref 3.9–5.0)
Alkaline Phosphatase: 43 IU/L (ref 39–117)
BUN/Creatinine Ratio: 6 — ABNORMAL LOW (ref 9–23)
BUN: 5 mg/dL — ABNORMAL LOW (ref 6–20)
Bilirubin Total: <0.2 mg/dL (ref 0.0–1.2)
CO2: 19 mmol/L — ABNORMAL LOW (ref 20–29)
Calcium: 8.6 mg/dL — ABNORMAL LOW (ref 8.7–10.2)
Chloride: 104 mmol/L (ref 96–106)
Creatinine, Ser: 0.81 mg/dL (ref 0.57–1.00)
GFR calc Af Amer: 114 mL/min/{1.73_m2} (ref >59)
GFR calc non Af Amer: 98 mL/min/{1.73_m2} (ref >59)
Globulin, Total: 3.6 g/dL (ref 1.5–4.5)
Glucose: 82 mg/dL (ref 65–99)
Potassium: 3.8 mmol/L (ref 3.5–5.2)
Sodium: 137 mmol/L (ref 134–144)
Total Protein: 7.6 g/dL (ref 6.0–8.5)

## 2019-06-06 LAB — CYTOLOGY - PAP
Comment: NEGATIVE
Diagnosis: NEGATIVE
High risk HPV: NEGATIVE

## 2019-06-06 LAB — URINALYSIS, MICROSCOPIC ONLY
Bacteria, UA: NONE SEEN
Casts: NONE SEEN /lpf
Epithelial Cells (non renal): NONE SEEN /hpf (ref 0–10)
WBC, UA: >30 /hpf — AB (ref 0–5)

## 2019-06-06 LAB — LIPID PANEL
Chol/HDL Ratio: 2.9 ratio (ref 0.0–4.4)
Cholesterol, Total: 132 mg/dL (ref 100–199)
HDL: 45 mg/dL (ref >39)
LDL Chol Calc (NIH): 77 mg/dL (ref 0–99)
Triglycerides: 42 mg/dL (ref 0–149)
VLDL Cholesterol Cal: 10 mg/dL (ref 5–40)

## 2019-06-07 ENCOUNTER — Telehealth: Payer: Self-pay | Admitting: *Deleted

## 2019-06-07 DIAGNOSIS — D649 Anemia, unspecified: Secondary | ICD-10-CM

## 2019-06-07 DIAGNOSIS — D72819 Decreased white blood cell count, unspecified: Secondary | ICD-10-CM

## 2019-06-07 LAB — NUSWAB BV AND CANDIDA, NAA
Candida albicans, NAA: NEGATIVE
Candida glabrata, NAA: NEGATIVE

## 2019-06-07 LAB — URINE CULTURE

## 2019-06-07 NOTE — Telephone Encounter (Signed)
Leda Min, RN  06/07/2019 9:12 AM EDT    Call to patient, no answer, mailbox full. No alternative number, MyChart not active.

## 2019-06-07 NOTE — Telephone Encounter (Signed)
-----   Message from Romualdo Bolk, MD sent at 06/06/2019  1:09 PM EDT ----- Please let the patient know that she still has a low WBC count and anemia (her vaginal bleeding isn't heavy). Please set up a hematology consultation.  Her urinalysis does show WBC and some blood (post pap). Culture is pending. Her calcium is slightly low, the rest of her blood work is okay. Her vaginitis panel and pap are pending. CC: Joaquin Courts, FNP

## 2019-06-07 NOTE — Telephone Encounter (Signed)
-----   Message from Romualdo Bolk, MD sent at 06/07/2019 12:58 PM EDT ----- Please inform the patient that she does have a UTI and treat with bactrim ds, 1 po BID x 3 days. The sensitivities are pending, but I don't want her to go untreated. Her pap and vaginitis panel are normal. 12 month recall per [facility] (prior +HPV)

## 2019-06-08 MED ORDER — SULFAMETHOXAZOLE-TRIMETHOPRIM 800-160 MG PO TABS: 1.0000 | ORAL_TABLET | Freq: Two times a day (BID) | ORAL | 0 refills | Status: DC

## 2019-06-08 NOTE — Telephone Encounter (Signed)
Spoke with patient. Advised of all results as seen below per Dr. Oscar La.   Rx for Bactrim DS to verified pharmacy.   Patient agreeable to proceed with referral to hematology, order placed to Dr. Myna Hidalgo. Advised patient our referral coordinator will return call with appt details once appt scheduled.   Patient declines to schedule next AEX at this time, states she will be relocating before next year. Placed in recall.   Patient verbalizes understanding and is agreeable.   Routing to provider for final review. Patient is agreeable to disposition. Will close encounter.  Cc: Soundra Pilon

## 2019-06-08 NOTE — Telephone Encounter (Signed)
-----   Message from Romualdo Bolk, MD sent at 06/07/2019  5:39 PM EDT ----- Please try and reach the patient again. She does need to start the bactrim as recommended earlier today (it is sensitive to bactrim). If you can't reach her either contact her listed person in the dpr or send a letter.

## 2019-06-12 ENCOUNTER — Telehealth: Payer: Self-pay | Admitting: Hematology & Oncology

## 2019-06-12 NOTE — Telephone Encounter (Signed)
Spoke with patient to confirm referral appointment 5/11 at 1030 am

## 2019-06-15 ENCOUNTER — Telehealth (INDEPENDENT_AMBULATORY_CARE_PROVIDER_SITE_OTHER): Payer: BC Managed Care – PPO | Admitting: Physician Assistant

## 2019-06-15 DIAGNOSIS — R0981 Nasal congestion: Secondary | ICD-10-CM | POA: Diagnosis not present

## 2019-06-15 NOTE — Progress Notes (Signed)
Patient ID: Faith Ross, female   DOB: Apr 02, 1990, 29 y.o.   MRN: [id number] Virtual Visit via Telephone Note  I connected with Faith Ross on 06/15/19 at  1:50 PM EDT by telephone and verified that I am speaking with the correct person using two identifiers.   I discussed the limitations, risks, security and privacy concerns of performing an evaluation and management service by telephone and the availability of in person appointments. I also discussed with the patient that there may be a patient responsible charge related to this service. The patient expressed understanding and agreed to proceed.  PATIENT visit by telephone virtually in the context of Covid-19 pandemic. Patient location:  home My Location:  [facility] office Persons on the call:  Me and the patient  History of Present Illness:  1 year h/o nasal congestion.  flonase not really helping.  No cough.  Denies regular use of OTC sprays such as afrin but did try it today w/o relief.  Condition seems to be worsening and is worse overall at night.  No cough.  No fever.      Observations/Objective:  NAD.  A&Ox3   Assessment and Plan: 1. Chronic nasal congestion Resume flonase, avoid OTC sprays such as Afrin(rhinitis medicamentosa was discussed-she says she doesn't use these except once today w/o relief),  mucinex D or sudafed per package instructions.  She may have polyps/structural abnormalities - Ambulatory referral to ENT    Follow Up Instructions: prn   I discussed the assessment and treatment plan with the patient. The patient was provided an opportunity to ask questions and all were answered. The patient agreed with the plan and demonstrated an understanding of the instructions.   The patient was advised to call back or seek an in-person evaluation if the symptoms worsen or if the condition fails to improve as anticipated.  I provided 11 minutes of non-face-to-face time during this encounter.   Georgian Co,  PA-C

## 2019-06-28 ENCOUNTER — Emergency Department (HOSPITAL_COMMUNITY)
Admission: EM | Admit: 2019-06-28 | Discharge: 2019-06-28 | Disposition: A | Discharge status: Home / Self Care | Payer: BC Managed Care – PPO | Attending: Emergency Medicine | Admitting: Emergency Medicine

## 2019-06-28 ENCOUNTER — Encounter (HOSPITAL_COMMUNITY): Payer: Self-pay

## 2019-06-28 ENCOUNTER — Other Ambulatory Visit: Payer: Self-pay | Admitting: Internal Medicine

## 2019-06-28 ENCOUNTER — Telehealth: Payer: Self-pay | Admitting: Family Medicine

## 2019-06-28 ENCOUNTER — Other Ambulatory Visit: Payer: Self-pay

## 2019-06-28 DIAGNOSIS — Z87891 Personal history of nicotine dependence: Secondary | ICD-10-CM | POA: Diagnosis not present

## 2019-06-28 DIAGNOSIS — H5789 Other specified disorders of eye and adnexa: Secondary | ICD-10-CM

## 2019-06-28 DIAGNOSIS — H1089 Other conjunctivitis: Secondary | ICD-10-CM | POA: Diagnosis not present

## 2019-06-28 MED ORDER — MONTELUKAST SODIUM 10 MG PO TABS: 10.0000 mg | ORAL_TABLET | Freq: Every day | ORAL | 3 refills | Status: DC

## 2019-06-28 MED ORDER — FLUORESCEIN SODIUM 1 MG OP STRP
1.0000 | ORAL_STRIP | Freq: Once | OPHTHALMIC | Status: AC
  Administered 2019-06-28: 2 via OPHTHALMIC
  Filled 2019-06-28: qty 1

## 2019-06-28 MED ORDER — TETRACAINE HCL 0.5 % OP SOLN
2.0000 [drp] | Freq: Once | OPHTHALMIC | Status: AC
  Administered 2019-06-28: 22:00:00 2 [drp] via OPHTHALMIC
  Filled 2019-06-28: qty 4

## 2019-06-28 NOTE — ED Provider Notes (Signed)
[facility] DEPT Provider Note   CSN: [id number] Arrival date & time: 06/28/19  2149     History Chief Complaint  Patient presents with  . Eye Pain    Faith Ross is a 29 y.o. female with past medical history of anxiety who presents today for evaluation of bilateral eye irritation.  She reports that for approximately 1 week she has had irritation of her bilateral eyes.  She has been attempting to treat this with over-the-counter eyedrops however it is continuing to worsen.  She states that she went to urgent care today and they told her it was allergies however her eyes are still itchy.  She has been taking Claritin.  She reports that she has had over 4 days without significant improvement in her symptoms.  She denies any new exposures.  No contact use.  No make-up, face products, detergents, lotions, medications or other exposures.    No thick drainage from the eyes.  No facial rashes.  No visual changes.    HPI     Past Medical History:  Diagnosis Date  . Alopecia   . Anxiety   . Chest wall pain     Patient Active Problem List   Diagnosis Date Noted  . Anxiety 12/02/2017  . Chest pain 12/02/2017  . Palpitations 12/02/2017    Past Surgical History:  Procedure Laterality Date  . TONSILLECTOMY       OB History    Gravida  0   Para  0   Term  0   Preterm  0   AB  0   Living  0     SAB  0   TAB  0   Ectopic  0   Multiple  0   Live Births  0           Family History  Problem Relation Age of Onset  . Thyroid disease Mother     Social History   Tobacco Use  . Smoking status: Former Research scientist (life sciences)  . Smokeless tobacco: Never Used  Substance Use Topics  . Alcohol use: Not Currently  . Drug use: Never    Home Medications Prior to Admission medications   Medication Sig Start Date End Date Taking? Authorizing Provider  betamethasone valerate ointment (VALISONE) 0.1 % Apply a pea sized amount topically BID for up to 2  weeks as needed 06/05/19   Salvadore Dom, MD  montelukast (SINGULAIR) 10 MG tablet Take 1 tablet (10 mg total) by mouth at bedtime. 06/28/19   Nicolette Bang, DO    Allergies    Bee pollen  Review of Systems   Review of Systems  Constitutional: Negative for chills and fever.  HENT: Positive for congestion (Chronic, unchanged). Negative for facial swelling.   Eyes: Positive for redness and itching. Negative for photophobia, pain and visual disturbance.  Neurological: Negative for headaches.  All other systems reviewed and are negative.   Physical Exam Updated Vital Signs BP (!) 144/80 (BP Location: Right Arm)   Pulse 79   Temp 97.8 F (36.6 C)   Resp 16   Ht 5\' 6"  (1.676 m)   Wt 54.4 kg   SpO2 100%   BMI 19.37 kg/m   Physical Exam Vitals and nursing note reviewed.  Constitutional:      General: She is not in acute distress.    Appearance: She is not ill-appearing.  HENT:     Head: Normocephalic.  Eyes:     General: Lids  are normal. Lids are everted, no foreign bodies appreciated. Vision grossly intact. Gaze aligned appropriately. No visual field deficit.    Extraocular Movements:     Right eye: Normal extraocular motion and no nystagmus.     Left eye: Normal extraocular motion and no nystagmus.     Conjunctiva/sclera:     Right eye: Right conjunctiva is injected.     Left eye: Left conjunctiva is injected.     Pupils: Pupils are equal, round, and reactive to light.     Right eye: No corneal abrasion or fluorescein uptake. Seidel exam negative.     Left eye: No corneal abrasion or fluorescein uptake. Seidel exam negative.    Visual Fields: Right eye visual fields normal and left eye visual fields normal.     Comments: Minimal injection bilaterally.  Patient shows me that she has bulging on the lateral eyes when she elevates her lids.  No swelling of the lids.  No erythema of the lids.   Cardiovascular:     Rate and Rhythm: Normal rate.  Pulmonary:      Effort: Pulmonary effort is normal. No respiratory distress.  Neurological:     Mental Status: She is alert.     ED Results / Procedures / Treatments   Labs (all labs ordered are listed, but only abnormal results are displayed) Labs Reviewed - No data to display  EKG None  Radiology No results found.  Procedures Procedures (including critical care time)  Medications Ordered in ED Medications  tetracaine (PONTOCAINE) 0.5 % ophthalmic solution 2 drop (2 drops Both Eyes Given 06/28/19 2204)  fluorescein ophthalmic strip 1 strip (2 strips Both Eyes Given 06/28/19 2204)    ED Course  I have reviewed the triage vital signs and the nursing notes.  Pertinent labs & imaging results that were available during my care of the patient were reviewed by me and considered in my medical decision making (see chart for details).    MDM Rules/Calculators/A&P                     Patient is a 29 year old healthy woman who presents today for bilateral eye irritation for 1 week.  No new exposures.  She does not wear contacts.  No changes in her vision.  She went to urgent care today and was told she has allergies.  She has been attempting to treat this with over-the-counter eyedrops.  On exam there is no evidence of a bacterial infection.  No fluorescein uptake.  Her vision is grossly intact.  No periorbital edema.  Recommended conservative care with allergy specific eyedrops, follow-up with ophthalmology.  No evidence of preseptal or postseptal cellulitis.  She is generally well-appearing.  Return precautions were discussed with patient who states their understanding.  At the time of discharge patient denied any unaddressed complaints or concerns.  Patient is agreeable for discharge home.  Note: Portions of this report may have been transcribed using voice recognition software. Every effort was made to ensure accuracy; however, inadvertent computerized transcription errors may be present   Final  Clinical Impression(s) / ED Diagnoses Final diagnoses:  Eye irritation    Rx / DC Orders ED Discharge Orders    None       Norman Clay 06/28/19 2330    Tegeler, Canary Brim, MD 06/29/19 Earle Gell

## 2019-06-28 NOTE — Telephone Encounter (Signed)
Called the pt

## 2019-06-28 NOTE — Telephone Encounter (Signed)
Pt called requesting new allrgey medication otc meds not working

## 2019-06-28 NOTE — Discharge Instructions (Addendum)
Please get one of the allergy eye drops over the counter.  Please do not use these tomorrow morning or any eye drops for 4-6 hours before you see the eye doctor as this can alter your exam.    Please consider taking 25-50 mg (Typically 1-2 pills) of benadryl at night. This will make you sleepy and you should not drive, operate heavy machinery or perform other dangerous tasks.

## 2019-06-28 NOTE — ED Triage Notes (Signed)
Bilateral eye irritation with swollen areas inside of eye socket. Pt sts sx for over a week.

## 2019-06-28 NOTE — Telephone Encounter (Signed)
I sent in a Rx for Singulair. It looks like when she saw Marylene Land in April she complained of similar symptoms and ENT referral was placed. Would recommend she follows up with specialist as planned.   Marcy Siren, D.O. Primary Care at [facility]  06/28/2019, 2:01 PM

## 2019-07-04 ENCOUNTER — Inpatient Hospital Stay: Payer: BC Managed Care – PPO | Attending: Hematology & Oncology | Admitting: Hematology & Oncology

## 2019-07-04 ENCOUNTER — Inpatient Hospital Stay: Payer: BC Managed Care – PPO

## 2019-07-06 ENCOUNTER — Ambulatory Visit: Payer: BC Managed Care – PPO | Admitting: Internal Medicine

## 2019-07-25 DIAGNOSIS — J343 Hypertrophy of nasal turbinates: Secondary | ICD-10-CM | POA: Insufficient documentation

## 2019-07-25 DIAGNOSIS — R0981 Nasal congestion: Secondary | ICD-10-CM | POA: Insufficient documentation

## 2019-07-25 DIAGNOSIS — J342 Deviated nasal septum: Secondary | ICD-10-CM | POA: Insufficient documentation

## 2019-09-04 ENCOUNTER — Encounter (HOSPITAL_COMMUNITY): Payer: Self-pay

## 2019-09-04 ENCOUNTER — Emergency Department (HOSPITAL_COMMUNITY)
Admission: EM | Admit: 2019-09-04 | Discharge: 2019-09-04 | Disposition: A | Discharge status: Home / Self Care | Payer: BC Managed Care – PPO | Attending: Emergency Medicine | Admitting: Emergency Medicine

## 2019-09-04 ENCOUNTER — Other Ambulatory Visit: Payer: Self-pay

## 2019-09-04 DIAGNOSIS — Y939 Activity, unspecified: Secondary | ICD-10-CM | POA: Insufficient documentation

## 2019-09-04 DIAGNOSIS — M545 Low back pain, unspecified: Secondary | ICD-10-CM

## 2019-09-04 DIAGNOSIS — Y999 Unspecified external cause status: Secondary | ICD-10-CM | POA: Insufficient documentation

## 2019-09-04 DIAGNOSIS — M25532 Pain in left wrist: Secondary | ICD-10-CM

## 2019-09-04 DIAGNOSIS — Y9289 Other specified places as the place of occurrence of the external cause: Secondary | ICD-10-CM | POA: Insufficient documentation

## 2019-09-04 DIAGNOSIS — W19XXXA Unspecified fall, initial encounter: Secondary | ICD-10-CM

## 2019-09-04 MED ORDER — CYCLOBENZAPRINE HCL 10 MG PO TABS
10.0000 mg | ORAL_TABLET | Freq: Two times a day (BID) | ORAL | 0 refills | Status: DC | PRN
Start: 2019-09-04 — End: 2020-10-10

## 2019-09-04 NOTE — ED Triage Notes (Signed)
Patient states she was getting ready to get onto an escalator and had her hands full and she did not grab the handrails and fell.  Patient denies hitting her head. Patient c/o left wrist, arm, back, and bilateral leg pain.

## 2019-09-04 NOTE — ED Provider Notes (Signed)
[facility]-EMERGENCY DEPT Provider Note   CSN: [id number] Arrival date & time: 09/04/19  1810     History Chief Complaint  Patient presents with  . Fall    Faith Ross is a 29 y.o. female without significant past medical history, presenting emergency department with complaint of pain after mechanical fall.  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  She was approaching the top of an escalator when she stepped onto it she fell down onto her bottom, mostly onto her right side.  She did not hit her head.  She is mostly having pain to her b/llow back, as well as upper back/shoulders.  She states her left wrist and forearm feels sore though she is not having any difficulty moving it.  She has been ambulatory without difficulty.  No numbness or weakness in extremities, no bowel or bladder incontinence, no saddle paresthesias.  She states she is feeling okay, however her boss recommended she be evaluated.  The history is provided by the patient.       Past Medical History:  Diagnosis Date  . Alopecia   . Anxiety   . Chest wall pain     Patient Active Problem List   Diagnosis Date Noted  . Anxiety 12/02/2017  . Chest pain 12/02/2017  . Palpitations 12/02/2017    Past Surgical History:  Procedure Laterality Date  . TONSILLECTOMY       OB History    Gravida  0   Para  0   Term  0   Preterm  0   AB  0   Living  0     SAB  0   TAB  0   Ectopic  0   Multiple  0   Live Births  0           Family History  Problem Relation Age of Onset  . Thyroid disease Mother     Social History   Tobacco Use  . Smoking status: Never Smoker  . Smokeless tobacco: Never Used  Vaping Use  . Vaping Use: Never used  Substance Use Topics  . Alcohol use: Not Currently  . Drug use: Never    Home Medications Prior to Admission medications   Medication Sig Start Date End Date Taking? Authorizing Provider    betamethasone valerate ointment (VALISONE) 0.1 % Apply a pea sized amount topically BID for up to 2 weeks as needed 06/05/19   Romualdo Bolk, MD  cyclobenzaprine (FLEXERIL) 10 MG tablet Take 1 tablet (10 mg total) by mouth 2 (two) times daily as needed for muscle spasms. 09/04/19   Bilal Manzer, [place] N, PA-C  montelukast (SINGULAIR) 10 MG tablet Take 1 tablet (10 mg total) by mouth at bedtime. 06/28/19   Arvilla Market, DO    Allergies    Patient has no active allergies.  Review of Systems   Review of Systems  Musculoskeletal: Positive for back pain and myalgias.  Neurological: Negative for weakness and numbness.  All other systems reviewed and are negative.   Physical Exam Updated Vital Signs BP 107/67 (BP Location: Left Arm)   Pulse 77   Temp 98.9 F (37.2 C) (Oral)   Resp 18   Ht 5\' 6"  (1.676 m)   Wt 57 kg   LMP 08/25/2019   SpO2 100%   BMI 20.29 kg/m   Physical Exam Vitals and nursing note reviewed.  Constitutional:  General: She is not in acute distress.    Appearance: She is well-developed.  HENT:     Head: Normocephalic and atraumatic.  Eyes:     Conjunctiva/sclera: Conjunctivae normal.  Cardiovascular:     Rate and Rhythm: Normal rate.  Pulmonary:     Effort: Pulmonary effort is normal. No respiratory distress.  Musculoskeletal:     Comments: Generalized tenderness to bilateral lumbar paraspinal musculature as well as bilateral trapezius muscle groups.  No midline spinal tenderness, no bony step-offs or gross deformities.  Left wrist and forearm without swelling, tenderness, or deformity.  Full normal active and passive range of motion without pain.  No bony tenderness.  No anatomical snuffbox tenderness.  Neurological:     Mental Status: She is alert.     Comments: Normal tone.  5/5 strength in BLE. Strong and equal grip strength b/l Sensory:  normal in BLE extremities.   Gait: normal gait and balance CV: distal pulses palpable throughout     Psychiatric:        Mood and Affect: Mood normal.        Behavior: Behavior normal.     ED Results / Procedures / Treatments   Labs (all labs ordered are listed, but only abnormal results are displayed) Labs Reviewed - No data to display  EKG None  Radiology No results found.  Procedures Procedures (including critical care time)  Medications Ordered in ED Medications - No data to display  ED Course  I have reviewed the triage vital signs and the nursing notes.  Pertinent labs & imaging results that were available during my care of the patient were reviewed by me and considered in my medical decision making (see chart for details).    MDM Rules/Calculators/A&P                          Patient presenting with muscle soreness after mechanical fall today.  No neuro symptoms or deficits on exam, no red flags.  Muscular tenderness is present to the back, no bony midline tenderness or deformity.  Left arm/wrist without swelling, tenderness, or deformity.  Full normal range of motion without pain.  No anatomical snuffbox tenderness.  Do not believe any imaging is indicated at this time.  Patient is comfortable with symptomatic management including RICE therapy, NSAIDs.  Ace wrap applied to wrist for comfort.  Flexeril provided for back pain.  Outpatient follow-up as needed.  Patient is agreeable to plan, safe for discharge. Final Clinical Impression(s) / ED Diagnoses Final diagnoses:  Fall, initial encounter  Acute bilateral low back pain without sciatica  Left wrist pain    Rx / DC Orders ED Discharge Orders         Ordered    cyclobenzaprine (FLEXERIL) 10 MG tablet  2 times daily PRN     Discontinue  Reprint     09/04/19 2051           Ayomide Purdy, [place] N, PA-C 09/04/19 2105    Linwood Dibbles, MD 09/05/19 [phone]

## 2019-09-04 NOTE — Discharge Instructions (Addendum)
Please read instructions below.  You can take 600 mg of ibuprofen every 6 hours as needed for pain.   Apply ice to your back for 20 minutes at a time.  You can also apply heat if this provides more relief.   You can take Flexeril/cyclobenzaprine every 12 hours as needed for muscle spasm.  Be aware this medication can make you drowsy; do not take while driving or drinking alcohol.   Follow-up with your primary care provider symptoms persist.   Return to ER if worsening pain in your wrist, new numbness or tingling in your arms or legs, inability to urinate, inability to hold your bowels, or weakness in your extremities.

## 2019-12-05 ENCOUNTER — Ambulatory Visit: Payer: Self-pay

## 2019-12-05 NOTE — Telephone Encounter (Signed)
  C/o nausea, poor appetitie, tired, diarrhea, adbdominal cramping since yesterday. Denies fever. Abdominal pain upper and lower abd. Comes and goes. Noted frequent urination. Possibility of pregnancy also getting close to start menstrual cycle. Care advise given. Patient verbalized understanding of care advise and to call back if symptoms worsen. Gave patient number to set up PCP.   Reason for Disposition . [1] MILD-MODERATE pain AND [2] comes and goes (cramps)  Answer Assessment - Initial Assessment Questions 1. LOCATION: "Where does it hurt?"      Abdominal pain and cramping since yesterday upper and lower abd. 2. RADIATION: "Does the pain shoot anywhere else?" (e.g., chest, back)     no 3. ONSET: "When did the pain begin?" (e.g., minutes, hours or days ago)      Yesterday  4. SUDDEN: "Gradual or sudden onset?"     na 5. PATTERN "Does the pain come and go, or is it constant?"    - If constant: "Is it getting better, staying the same, or worsening?"      (Note: Constant means the pain never goes away completely; most serious pain is constant and it progresses)     - If intermittent: "How long does it last?" "Do you have pain now?"     (Note: Intermittent means the pain goes away completely between bouts)     Comes and goes 6. SEVERITY: "How bad is the pain?"  (e.g., Scale 1-10; mild, moderate, or severe)   - MILD (1-3): doesn't interfere with normal activities, abdomen soft and not tender to touch    - MODERATE (4-7): interferes with normal activities or awakens from sleep, tender to touch    - SEVERE (8-10): excruciating pain, doubled over, unable to do any normal activities      Mild  7. RECURRENT SYMPTOM: "Have you ever had this type of stomach pain before?" If Yes, ask: "When was the last time?" and "What happened that time?"      Na  8. CAUSE: "What do you think is causing the stomach pain?"     Not sure  9. RELIEVING/AGGRAVATING FACTORS: "What makes it better or worse?" (e.g.,  movement, antacids, bowel movement)     Has had diarrhea and every episode abdominal pain get better  10. OTHER SYMPTOMS: "Has there been any vomiting, diarrhea, constipation, or urine problems?"       Nausea, diarrhea, tired, abdominal cramping, frequent urination 11. PREGNANCY: "Is there any chance you are pregnant?" "When was your last menstrual period?"       Possible pregnancy but uses protection, LMP 11/09/19  Protocols used: ABDOMINAL PAIN - [facility]

## 2020-03-29 ENCOUNTER — Encounter (HOSPITAL_COMMUNITY): Payer: Self-pay

## 2020-03-29 ENCOUNTER — Other Ambulatory Visit: Payer: Self-pay

## 2020-03-29 ENCOUNTER — Emergency Department (HOSPITAL_COMMUNITY)
Admission: EM | Admit: 2020-03-29 | Discharge: 2020-03-30 | Disposition: A | Discharge status: Home / Self Care | Payer: Medicaid Other | Attending: Emergency Medicine | Admitting: Emergency Medicine

## 2020-03-29 ENCOUNTER — Emergency Department (HOSPITAL_COMMUNITY): Payer: Medicaid Other

## 2020-03-29 DIAGNOSIS — R0789 Other chest pain: Secondary | ICD-10-CM | POA: Insufficient documentation

## 2020-03-29 NOTE — ED Triage Notes (Signed)
Pt reports sharp left sided chest pain at rest starting 2 hours prior to arrival. Pt reports sharp pain on inspiration and worse when laying down.

## 2020-03-30 LAB — CBC
HCT: 31.6 % — ABNORMAL LOW (ref 36.0–46.0)
Hemoglobin: 11.3 g/dL — ABNORMAL LOW (ref 12.0–15.0)
MCH: 31.1 pg (ref 26.0–34.0)
MCHC: 35.8 g/dL (ref 30.0–36.0)
MCV: 87.1 fL (ref 80.0–100.0)
Platelets: 219 10*3/uL (ref 150–400)
RBC: 3.63 MIL/uL — ABNORMAL LOW (ref 3.87–5.11)
RDW: 11.9 % (ref 11.5–15.5)
WBC: 2.5 10*3/uL — ABNORMAL LOW (ref 4.0–10.5)
nRBC: 0 % (ref 0.0–0.2)

## 2020-03-30 LAB — BASIC METABOLIC PANEL
Anion gap: 9 (ref 5–15)
BUN: 14 mg/dL (ref 6–20)
CO2: 22 mmol/L (ref 22–32)
Calcium: 9.7 mg/dL (ref 8.9–10.3)
Chloride: 105 mmol/L (ref 98–111)
Creatinine, Ser: 1 mg/dL (ref 0.44–1.00)
GFR, Estimated: >60 mL/min (ref >60)
Glucose, Bld: 100 mg/dL — ABNORMAL HIGH (ref 70–99)
Potassium: 3.1 mmol/L — ABNORMAL LOW (ref 3.5–5.1)
Sodium: 136 mmol/L (ref 135–145)

## 2020-03-30 LAB — I-STAT BETA HCG BLOOD, ED (MC, WL, AP ONLY): I-stat hCG, quantitative: <5 m[IU]/mL (ref <5)

## 2020-03-30 LAB — TROPONIN I (HIGH SENSITIVITY): Troponin I (High Sensitivity): <2 ng/L (ref <18)

## 2020-03-30 MED ORDER — NAPROXEN 500 MG PO TABS
500.0000 mg | ORAL_TABLET | Freq: Two times a day (BID) | ORAL | 0 refills | Status: DC
Start: 2020-03-30 — End: 2020-09-29

## 2020-03-30 MED ORDER — IBUPROFEN 200 MG PO TABS
600.0000 mg | ORAL_TABLET | Freq: Once | ORAL | Status: AC
  Administered 2020-03-30: 600 mg via ORAL
  Filled 2020-03-30: qty 3

## 2020-03-30 NOTE — Discharge Instructions (Addendum)
You were seen today for chest pain.  Your work-up is reassuring.  Your EKG, heart testing, and chest x-ray are all normal.  Take naproxen as needed for pain.  Follow-up with your primary doctor if symptoms continue.

## 2020-03-30 NOTE — ED Provider Notes (Signed)
[facility]-EMERGENCY DEPT Provider Note   CSN: [id number] Arrival date & time: 03/29/20  2311     History Chief Complaint  Patient presents with  . Chest Pain    Faith Ross is a 30 y.o. female.  HPI     This is a 30 year old female with a history of anxiety and alopecia who presents with chest pain.  Patient reports onset of chest pain around 8:30 PM.  She states that it is anterior nonradiating.  Is worse with breathing and when she leans over.  No recent fevers or illnesses.  Denies upper respiratory symptoms.  Denies shortness of breath.  She is not on any estrogen containing medications.  Has not noted any leg swelling and has not had any history of blood clots.  She rates her pain at 4 out of 10.  She did not take anything for the pain.  No known history of hypertension, hyperlipidemia, diabetes.  No early family history of heart disease.  Past Medical History:  Diagnosis Date  . Alopecia   . Anxiety   . Chest wall pain     Patient Active Problem List   Diagnosis Date Noted  . Anxiety 12/02/2017  . Chest pain 12/02/2017  . Palpitations 12/02/2017    Past Surgical History:  Procedure Laterality Date  . TONSILLECTOMY       OB History    Gravida  0   Para  0   Term  0   Preterm  0   AB  0   Living  0     SAB  0   IAB  0   Ectopic  0   Multiple  0   Live Births  0           Family History  Problem Relation Age of Onset  . Thyroid disease Mother     Social History   Tobacco Use  . Smoking status: Never Smoker  . Smokeless tobacco: Never Used  Vaping Use  . Vaping Use: Never used  Substance Use Topics  . Alcohol use: Not Currently  . Drug use: Never    Home Medications Prior to Admission medications   Medication Sig Start Date End Date Taking? Authorizing Provider  naproxen (NAPROSYN) 500 MG tablet Take 1 tablet (500 mg total) by mouth 2 (two) times daily. 03/30/20  Yes Brevan Luberto, Mayer Masker, MD   betamethasone valerate ointment (VALISONE) 0.1 % Apply a pea sized amount topically BID for up to 2 weeks as needed 06/05/19   Romualdo Bolk, MD  cyclobenzaprine (FLEXERIL) 10 MG tablet Take 1 tablet (10 mg total) by mouth 2 (two) times daily as needed for muscle spasms. 09/04/19   Robinson, [place] N, PA-C  montelukast (SINGULAIR) 10 MG tablet Take 1 tablet (10 mg total) by mouth at bedtime. 06/28/19   Arvilla Market, DO    Allergies    Patient has no known allergies.  Review of Systems   Review of Systems  Constitutional: Negative for fever.  Respiratory: Negative for shortness of breath.   Cardiovascular: Positive for chest pain. Negative for leg swelling.  Gastrointestinal: Negative for abdominal pain, nausea and vomiting.  Genitourinary: Negative for dysuria.  All other systems reviewed and are negative.   Physical Exam Updated Vital Signs BP 110/84 (BP Location: Left Arm)   Pulse 81   Temp 98.3 F (36.8 C) (Oral)   Resp 16   Ht 1.676 m (5\' 6" )   Wt 56.7 kg  SpO2 100%   BMI 20.18 kg/m   Physical Exam Vitals and nursing note reviewed.  Constitutional:      Appearance: She is well-developed and well-nourished.  HENT:     Head: Normocephalic and atraumatic.  Eyes:     Pupils: Pupils are equal, round, and reactive to light.  Cardiovascular:     Rate and Rhythm: Normal rate and regular rhythm.     Heart sounds: Normal heart sounds.  Pulmonary:     Effort: Pulmonary effort is normal. No respiratory distress.     Breath sounds: No wheezing.  Chest:     Chest wall: No tenderness.  Abdominal:     General: Bowel sounds are normal.     Palpations: Abdomen is soft.  Musculoskeletal:     Cervical back: Neck supple.     Right lower leg: No tenderness. No edema.     Left lower leg: No tenderness. No edema.  Skin:    General: Skin is warm and dry.  Neurological:     Mental Status: She is alert and oriented to person, place, and time.  Psychiatric:         Mood and Affect: Mood and affect and mood normal.     ED Results / Procedures / Treatments   Labs (all labs ordered are listed, but only abnormal results are displayed) Labs Reviewed  BASIC METABOLIC PANEL - Abnormal; Notable for the following components:      Result Value   Potassium 3.1 (*)    Glucose, Bld 100 (*)    All other components within normal limits  CBC - Abnormal; Notable for the following components:   WBC 2.5 (*)    RBC 3.63 (*)    Hemoglobin 11.3 (*)    HCT 31.6 (*)    All other components within normal limits  I-STAT BETA HCG BLOOD, ED ([facility], [facility], [facility] ONLY)  TROPONIN I (HIGH SENSITIVITY)  TROPONIN I (HIGH SENSITIVITY)    EKG EKG Interpretation  Date/Time:  Friday March 29 2020 23:27:41 EST Ventricular Rate:  85 PR Interval:    QRS Duration: 76 QT Interval:  374 QTC Calculation: 445 R Axis:   67 Text Interpretation: Sinus rhythm 12 Lead; Mason-Likar Confirmed by Ross Marcus ([id number]) on 03/29/2020 11:31:02 PM   Radiology DG Chest 2 View  Result Date: 03/30/2020 CLINICAL DATA:  Chest pain EXAM: CHEST - 2 VIEW COMPARISON:  Chest x-ray 11/30/2017 FINDINGS: The heart size and mediastinal contours are within normal limits. No focal consolidation. No pulmonary edema. No pleural effusion. No pneumothorax. No acute osseous abnormality. IMPRESSION: No active cardiopulmonary disease. Electronically Signed   By: Tish Frederickson M.D.   On: 03/30/2020 00:09    Procedures Procedures   Medications Ordered in ED Medications  ibuprofen (ADVIL) tablet 600 mg (600 mg Oral Given 03/30/20 0043)    ED Course  I have reviewed the triage vital signs and the nursing notes.  Pertinent labs & imaging results that were available during my care of the patient were reviewed by me and considered in my medical decision making (see chart for details).    MDM Rules/Calculators/A&P                          Patient presents with chest pain.  She is overall nontoxic and vital  signs are reassuring.  Her heart and pulmonary exam are benign.  She is satting 100% on room air.  EKG without acute ischemic changes or  arrhythmia.  She is PE RC negative and doubt PE.  Chest x-ray obtained and shows no evidence of pneumothorax or pneumonia.  This was independently reviewed by myself.  She is low risk for ACS.  Troponin x1 -.  Feel this is enough for a stratification as her symptoms are very atypical.  She has some mild hypokalemia but otherwise her lab work is reassuring.  Patient reassured.  Patient given ibuprofen.  Recommend anti-inflammatories at home and follow-up with PCP.  After history, exam, and medical workup I feel the patient has been appropriately medically screened and is safe for discharge home. Pertinent diagnoses were discussed with the patient. Patient was given return precautions.  Final Clinical Impression(s) / ED Diagnoses Final diagnoses:  Atypical chest pain    Rx / DC Orders ED Discharge Orders         Ordered    naproxen (NAPROSYN) 500 MG tablet  2 times daily        03/30/20 0139           Shon Baton, MD 03/30/20 0145

## 2020-04-01 ENCOUNTER — Telehealth: Payer: Self-pay | Admitting: *Deleted

## 2020-04-01 NOTE — Telephone Encounter (Signed)
Error

## 2020-04-01 NOTE — Telephone Encounter (Signed)
Transition Care Management Unsuccessful Follow-up Telephone Call  Date of discharge and from where:  03/30/2020 - [facility]  Attempts:  1st Attempt  Reason for unsuccessful TCM follow-up call:  Voice mail full

## 2020-04-02 NOTE — Telephone Encounter (Signed)
Transition Care Management Unsuccessful Follow-up Telephone Call  Date of discharge and from where:  03/30/2020 - [facility]  Attempts:  2nd Attempt  Reason for unsuccessful TCM follow-up call:  Voice mail full

## 2020-04-03 NOTE — Telephone Encounter (Signed)
Transition Care Management Unsuccessful Follow-up Telephone Call  Date of discharge and from where:  03/30/2020 - [facility]  Attempts:  3rd Attempt  Reason for unsuccessful TCM follow-up call:  Voice mail full

## 2020-04-24 DIAGNOSIS — B373 Candidiasis of vulva and vagina: Secondary | ICD-10-CM | POA: Diagnosis not present

## 2020-04-24 DIAGNOSIS — Z113 Encounter for screening for infections with a predominantly sexual mode of transmission: Secondary | ICD-10-CM | POA: Diagnosis not present

## 2020-04-24 DIAGNOSIS — Z01419 Encounter for gynecological examination (general) (routine) without abnormal findings: Secondary | ICD-10-CM | POA: Diagnosis not present

## 2020-04-24 DIAGNOSIS — N898 Other specified noninflammatory disorders of vagina: Secondary | ICD-10-CM | POA: Diagnosis not present

## 2020-05-09 DIAGNOSIS — Z3042 Encounter for surveillance of injectable contraceptive: Secondary | ICD-10-CM | POA: Diagnosis not present

## 2020-05-09 DIAGNOSIS — Z3200 Encounter for pregnancy test, result unknown: Secondary | ICD-10-CM | POA: Diagnosis not present

## 2020-05-29 ENCOUNTER — Encounter (HOSPITAL_COMMUNITY): Payer: Self-pay

## 2020-05-29 ENCOUNTER — Other Ambulatory Visit: Payer: Self-pay

## 2020-05-29 ENCOUNTER — Emergency Department (HOSPITAL_COMMUNITY)
Admission: EM | Admit: 2020-05-29 | Discharge: 2020-05-29 | Disposition: A | Discharge status: Home / Self Care | Payer: 59 | Attending: Emergency Medicine | Admitting: Emergency Medicine

## 2020-05-29 DIAGNOSIS — R1033 Periumbilical pain: Secondary | ICD-10-CM | POA: Diagnosis not present

## 2020-05-29 DIAGNOSIS — R197 Diarrhea, unspecified: Secondary | ICD-10-CM | POA: Insufficient documentation

## 2020-05-29 DIAGNOSIS — R112 Nausea with vomiting, unspecified: Secondary | ICD-10-CM | POA: Diagnosis present

## 2020-05-29 LAB — COMPREHENSIVE METABOLIC PANEL
ALT: 20 U/L (ref 0–44)
AST: 20 U/L (ref 15–41)
Albumin: 4.2 g/dL (ref 3.5–5.0)
Alkaline Phosphatase: 32 U/L — ABNORMAL LOW (ref 38–126)
Anion gap: 6 (ref 5–15)
BUN: 12 mg/dL (ref 6–20)
CO2: 21 mmol/L — ABNORMAL LOW (ref 22–32)
Calcium: 9.3 mg/dL (ref 8.9–10.3)
Chloride: 113 mmol/L — ABNORMAL HIGH (ref 98–111)
Creatinine, Ser: 0.92 mg/dL (ref 0.44–1.00)
GFR, Estimated: >60 mL/min (ref >60)
Glucose, Bld: 94 mg/dL (ref 70–99)
Potassium: 3.8 mmol/L (ref 3.5–5.1)
Sodium: 140 mmol/L (ref 135–145)
Total Bilirubin: 0.5 mg/dL (ref 0.3–1.2)
Total Protein: 8.9 g/dL — ABNORMAL HIGH (ref 6.5–8.1)

## 2020-05-29 LAB — URINALYSIS, ROUTINE W REFLEX MICROSCOPIC
Bilirubin Urine: NEGATIVE
Glucose, UA: NEGATIVE mg/dL
Hgb urine dipstick: NEGATIVE
Ketones, ur: NEGATIVE mg/dL
Nitrite: NEGATIVE
Protein, ur: NEGATIVE mg/dL
Specific Gravity, Urine: 1.01 (ref 1.005–1.030)
pH: 6 (ref 5.0–8.0)

## 2020-05-29 LAB — CBC WITH DIFFERENTIAL/PLATELET
Abs Immature Granulocytes: 0.01 10*3/uL (ref 0.00–0.07)
Basophils Absolute: 0 10*3/uL (ref 0.0–0.1)
Basophils Relative: 0 %
Eosinophils Absolute: 0.1 10*3/uL (ref 0.0–0.5)
Eosinophils Relative: 2 %
HCT: 34.4 % — ABNORMAL LOW (ref 36.0–46.0)
Hemoglobin: 12 g/dL (ref 12.0–15.0)
Immature Granulocytes: 0 %
Lymphocytes Relative: 28 %
Lymphs Abs: 0.9 10*3/uL (ref 0.7–4.0)
MCH: 30.6 pg (ref 26.0–34.0)
MCHC: 34.9 g/dL (ref 30.0–36.0)
MCV: 87.8 fL (ref 80.0–100.0)
Monocytes Absolute: 0.2 10*3/uL (ref 0.1–1.0)
Monocytes Relative: 7 %
Neutro Abs: 2 10*3/uL (ref 1.7–7.7)
Neutrophils Relative %: 63 %
Platelets: 236 10*3/uL (ref 150–400)
RBC: 3.92 MIL/uL (ref 3.87–5.11)
RDW: 12.4 % (ref 11.5–15.5)
WBC: 3.2 10*3/uL — ABNORMAL LOW (ref 4.0–10.5)
nRBC: 0 % (ref 0.0–0.2)

## 2020-05-29 LAB — PREGNANCY, URINE: Preg Test, Ur: NEGATIVE

## 2020-05-29 LAB — LIPASE, BLOOD: Lipase: 44 U/L (ref 11–51)

## 2020-05-29 LAB — I-STAT BETA HCG BLOOD, ED (MC, WL, AP ONLY): I-stat hCG, quantitative: <5 m[IU]/mL (ref <5)

## 2020-05-29 MED ORDER — DICYCLOMINE HCL 20 MG PO TABS: 20.0000 mg | ORAL_TABLET | Freq: Two times a day (BID) | ORAL | 0 refills | Status: DC

## 2020-05-29 MED ORDER — SODIUM CHLORIDE 0.9 % IV BOLUS
1000.0000 mL | Freq: Once | INTRAVENOUS | Status: AC
  Administered 2020-05-29: 1000 mL via INTRAVENOUS

## 2020-05-29 MED ORDER — ONDANSETRON 4 MG PO TBDP: 4.0000 mg | ORAL_TABLET | Freq: Three times a day (TID) | ORAL | 0 refills | Status: DC | PRN

## 2020-05-29 MED ORDER — DICYCLOMINE HCL 10 MG/ML IM SOLN
20.0000 mg | Freq: Once | INTRAMUSCULAR | Status: AC
  Administered 2020-05-29: 20 mg via INTRAMUSCULAR
  Filled 2020-05-29: qty 2

## 2020-05-29 MED ORDER — ONDANSETRON HCL 4 MG/2ML IJ SOLN
4.0000 mg | Freq: Once | INTRAMUSCULAR | Status: AC
  Administered 2020-05-29: 4 mg via INTRAVENOUS
  Filled 2020-05-29: qty 2

## 2020-05-29 NOTE — ED Notes (Signed)
Discharge packet provided.  Reviewed with the patient and the patient reports understanding those instructions

## 2020-05-29 NOTE — ED Provider Notes (Signed)
[facility]-EMERGENCY DEPT Provider Note   CSN: [id number] Arrival date & time: 05/29/20  7416     History Chief Complaint  Patient presents with  . Emesis    Faith Ross is a 30 y.o. female with a past medical history of anxiety presenting to the ED with a chief complaint of abdominal pain.  Symptoms have been going on for the past week.  Reports periumbilical abdominal pain that feels like a cramping sensation that is intermittent.  She noticed today that she had several episodes of nonbloody, nonbilious emesis and nonbloody diarrhea.  She has tried taking zinc to try to help with her pain because her mom told her to do so.  Denies any improvement.  No sick contacts with similar symptoms.  Denies any suspicious food intake.  No prior abdominal surgeries, alcohol or drug use or possibility of pregnancy.  No vaginal complaints or abnormal bleeding.  HPI     Past Medical History:  Diagnosis Date  . Alopecia   . Anxiety   . Chest wall pain     Patient Active Problem List   Diagnosis Date Noted  . Anxiety 12/02/2017  . Chest pain 12/02/2017  . Palpitations 12/02/2017    Past Surgical History:  Procedure Laterality Date  . TONSILLECTOMY       OB History    Gravida  0   Para  0   Term  0   Preterm  0   AB  0   Living  0     SAB  0   IAB  0   Ectopic  0   Multiple  0   Live Births  0           Family History  Problem Relation Age of Onset  . Thyroid disease Mother     Social History   Tobacco Use  . Smoking status: Never Smoker  . Smokeless tobacco: Never Used  Vaping Use  . Vaping Use: Never used  Substance Use Topics  . Alcohol use: Not Currently  . Drug use: Never    Home Medications Prior to Admission medications   Medication Sig Start Date End Date Taking? Authorizing Provider  dicyclomine (BENTYL) 20 MG tablet Take 1 tablet (20 mg total) by mouth 2 (two) times daily. 05/29/20  Yes Albert Hersch, PA-C   ondansetron (ZOFRAN ODT) 4 MG disintegrating tablet Take 1 tablet (4 mg total) by mouth every 8 (eight) hours as needed for nausea or vomiting. 05/29/20  Yes Nathali Vent, PA-C  betamethasone valerate ointment (VALISONE) 0.1 % Apply a pea sized amount topically BID for up to 2 weeks as needed 06/05/19   Romualdo Bolk, MD  cyclobenzaprine (FLEXERIL) 10 MG tablet Take 1 tablet (10 mg total) by mouth 2 (two) times daily as needed for muscle spasms. 09/04/19   Robinson, [place] N, PA-C  montelukast (SINGULAIR) 10 MG tablet Take 1 tablet (10 mg total) by mouth at bedtime. 06/28/19   Arvilla Market, DO  naproxen (NAPROSYN) 500 MG tablet Take 1 tablet (500 mg total) by mouth 2 (two) times daily. 03/30/20   Horton, Mayer Masker, MD    Allergies    Patient has no known allergies.  Review of Systems   Review of Systems  Constitutional: Negative for appetite change, chills and fever.  HENT: Negative for ear pain, rhinorrhea, sneezing and sore throat.   Eyes: Negative for photophobia and visual disturbance.  Respiratory: Negative for cough, chest tightness, shortness of  breath and wheezing.   Cardiovascular: Negative for chest pain and palpitations.  Gastrointestinal: Positive for abdominal pain, diarrhea, nausea and vomiting. Negative for blood in stool and constipation.  Genitourinary: Negative for dysuria, hematuria and urgency.  Musculoskeletal: Negative for myalgias.  Skin: Negative for rash.  Neurological: Negative for dizziness, weakness and light-headedness.    Physical Exam Updated Vital Signs BP (!) 111/91 (BP Location: Right Arm)   Pulse 76   Temp (!) 97.5 F (36.4 C) (Oral)   Resp 18   SpO2 100%   Physical Exam Vitals and nursing note reviewed.  Constitutional:      General: She is not in acute distress.    Appearance: She is well-developed.  HENT:     Head: Normocephalic and atraumatic.     Nose: Nose normal.  Eyes:     General: No scleral icterus.       Left eye:  No discharge.     Conjunctiva/sclera: Conjunctivae normal.  Cardiovascular:     Rate and Rhythm: Normal rate and regular rhythm.     Heart sounds: Normal heart sounds. No murmur heard. No friction rub. No gallop.   Pulmonary:     Effort: Pulmonary effort is normal. No respiratory distress.     Breath sounds: Normal breath sounds.  Abdominal:     General: Bowel sounds are normal. There is no distension.     Palpations: Abdomen is soft.     Tenderness: There is no abdominal tenderness (Periumbilical). There is no guarding.  Musculoskeletal:        General: Normal range of motion.     Cervical back: Normal range of motion and neck supple.  Skin:    General: Skin is warm and dry.     Findings: No rash.  Neurological:     Mental Status: She is alert.     Motor: No abnormal muscle tone.     Coordination: Coordination normal.     ED Results / Procedures / Treatments   Labs (all labs ordered are listed, but only abnormal results are displayed) Labs Reviewed  URINALYSIS, ROUTINE W REFLEX MICROSCOPIC - Abnormal; Notable for the following components:      Result Value   APPearance HAZY (*)    Leukocytes,Ua TRACE (*)    Bacteria, UA RARE (*)    All other components within normal limits  COMPREHENSIVE METABOLIC PANEL - Abnormal; Notable for the following components:   Chloride 113 (*)    CO2 21 (*)    Total Protein 8.9 (*)    Alkaline Phosphatase 32 (*)    All other components within normal limits  CBC WITH DIFFERENTIAL/PLATELET - Abnormal; Notable for the following components:   WBC 3.2 (*)    HCT 34.4 (*)    All other components within normal limits  PREGNANCY, URINE  LIPASE, BLOOD  I-STAT BETA HCG BLOOD, ED ([facility], [facility], [facility] ONLY)    EKG None  Radiology No results found.  Procedures Procedures   Medications Ordered in ED Medications  sodium chloride 0.9 % bolus 1,000 mL (0 mLs Intravenous Stopped 05/29/20 1103)  ondansetron (ZOFRAN) injection 4 mg (4 mg Intravenous Given  05/29/20 0959)  dicyclomine (BENTYL) injection 20 mg (20 mg Intramuscular Given 05/29/20 1010)    ED Course  I have reviewed the triage vital signs and the nursing notes.  Pertinent labs & imaging results that were available during my care of the patient were reviewed by me and considered in my medical decision making (see chart  for details).  Clinical Course as of 05/29/20 1137  Wed May 29, 2020  0933 Preg Test, Ur: NEGATIVE [HK]    Clinical Course User Index [HK] Dietrich Pates, PA-C   MDM Rules/Calculators/A&P                          30 year old female with a past medical history of anxiety presenting to the ED with a chief complaint of abdominal pain, vomiting and diarrhea.  Reports periumbilical abdominal pain that feels like cramping sensation that them intermittently.  No suspicious food intake.  No prior abdominal surgeries, alcohol or drug use or possibility of pregnancy.  On exam patient without any specific abdominal tenderness.  It is soft and nondistended.  Lab work here including hCG is negative.  Normal lipase, CBC and CMP noted.  Urinalysis with trace leukocytes, rare bacteria but several squamous cells.  She is asymptomatic here.  Patient given IV fluids, Bentyl and Zofran with complete resolution of her symptoms.  Abdominal exams are benign.  Patient is stating that the monitor BP is making her more anxious and that she would like to go home.  I doubt her symptoms are due to appendicitis, cholecystitis or other surgical emergent cause of her symptoms based on her reassuring work-up and physical exam findings as well as her improvement today.  Patient is agreeable to staying hydrated and slowly advancing her diet as tolerated, will give Bentyl and Zofran to take as needed. Return precautions given.   Patient is hemodynamically stable, in NAD, and able to ambulate in the ED. Evaluation does not show pathology that would require ongoing emergent intervention or inpatient treatment. I  explained the diagnosis to the patient. Pain has been managed and has no complaints prior to discharge. Patient is comfortable with above plan and is stable for discharge at this time. All questions were answered prior to disposition. Strict return precautions for returning to the ED were discussed. Encouraged follow up with PCP.   An After Visit Summary was printed and given to the patient.   Portions of this note were generated with Scientist, clinical (histocompatibility and immunogenetics). Dictation errors may occur despite best attempts at proofreading.  Final Clinical Impression(s) / ED Diagnoses Final diagnoses:  Nausea vomiting and diarrhea    Rx / DC Orders ED Discharge Orders         Ordered    ondansetron (ZOFRAN ODT) 4 MG disintegrating tablet  Every 8 hours PRN        05/29/20 1121    dicyclomine (BENTYL) 20 MG tablet  2 times daily        05/29/20 [address] [place], PA-C 05/29/20 1137    Horton, Clabe Seal, DO 05/30/20 1015

## 2020-05-29 NOTE — Discharge Instructions (Signed)
Take medications as needed to help with your symptoms. Make sure you are drinking plenty of fluids and slowly advancing her diet as tolerated. Follow-up with your primary care provider. Return to the ER if you start to experience persistent vomiting, severe abdominal pain or fever, chest pain or shortness of breath

## 2020-05-29 NOTE — ED Triage Notes (Signed)
Pt arrived via walk in, c/o n/v and abd pain x1 week. Denies any urinary sx or diarrhea.

## 2020-05-30 ENCOUNTER — Telehealth: Payer: Self-pay | Admitting: *Deleted

## 2020-05-30 NOTE — Telephone Encounter (Signed)
Transition Care Management Unsuccessful Follow-up Telephone Call  Date of discharge and from where:  05/29/2020 - [facility]  Attempts:  1st Attempt  Reason for unsuccessful TCM follow-up call:  Voice mail full

## 2020-05-31 NOTE — Telephone Encounter (Signed)
Transition Care Management Follow-up Telephone Call  Date of discharge and from where: 05/29/2020 - [facility]  How have you been since you were released from the hospital? "I am fine"  Any questions or concerns? No  Items Reviewed:  Did the pt receive and understand the discharge instructions provided? Yes   Medications obtained and verified? Yes   Other? No   Any new allergies since your discharge? No   Dietary orders reviewed? No  Do you have support at home? Yes     Functional Questionnaire: (I = Independent and D = Dependent) ADLs: I  Bathing/Dressing- I  Meal Prep- I  Eating- I  Maintaining continence- I  Transferring/Ambulation- I  Managing Meds- I  Follow up appointments reviewed:   PCP Hospital f/u appt confirmed? No    Specialist Hospital f/u appt confirmed? No    Are transportation arrangements needed? No   If their condition worsens, is the pt aware to call PCP or go to the Emergency Dept.? Yes  Was the patient provided with contact information for the PCP's office or ED? Yes  Was to pt encouraged to call back with questions or concerns? Yes

## 2020-06-14 ENCOUNTER — Emergency Department (HOSPITAL_COMMUNITY)
Admission: EM | Admit: 2020-06-14 | Discharge: 2020-06-14 | Disposition: A | Discharge status: Home / Self Care | Payer: Medicaid Other | Attending: Emergency Medicine | Admitting: Emergency Medicine

## 2020-06-14 ENCOUNTER — Other Ambulatory Visit: Payer: Self-pay

## 2020-06-14 DIAGNOSIS — R197 Diarrhea, unspecified: Secondary | ICD-10-CM | POA: Insufficient documentation

## 2020-06-14 DIAGNOSIS — R112 Nausea with vomiting, unspecified: Secondary | ICD-10-CM

## 2020-06-14 DIAGNOSIS — F410 Panic disorder [episodic paroxysmal anxiety] without agoraphobia: Secondary | ICD-10-CM | POA: Insufficient documentation

## 2020-06-14 DIAGNOSIS — R1033 Periumbilical pain: Secondary | ICD-10-CM | POA: Insufficient documentation

## 2020-06-14 LAB — CBC WITH DIFFERENTIAL/PLATELET
Abs Immature Granulocytes: 0 10*3/uL (ref 0.00–0.07)
Basophils Absolute: 0 10*3/uL (ref 0.0–0.1)
Basophils Relative: 0 %
Eosinophils Absolute: 0.1 10*3/uL (ref 0.0–0.5)
Eosinophils Relative: 3 %
HCT: 31.9 % — ABNORMAL LOW (ref 36.0–46.0)
Hemoglobin: 11.1 g/dL — ABNORMAL LOW (ref 12.0–15.0)
Immature Granulocytes: 0 %
Lymphocytes Relative: 50 %
Lymphs Abs: 1.3 10*3/uL (ref 0.7–4.0)
MCH: 30.3 pg (ref 26.0–34.0)
MCHC: 34.8 g/dL (ref 30.0–36.0)
MCV: 87.2 fL (ref 80.0–100.0)
Monocytes Absolute: 0.2 10*3/uL (ref 0.1–1.0)
Monocytes Relative: 9 %
Neutro Abs: 1 10*3/uL — ABNORMAL LOW (ref 1.7–7.7)
Neutrophils Relative %: 38 %
Platelets: 217 10*3/uL (ref 150–400)
RBC: 3.66 MIL/uL — ABNORMAL LOW (ref 3.87–5.11)
RDW: 12.1 % (ref 11.5–15.5)
WBC: 2.6 10*3/uL — ABNORMAL LOW (ref 4.0–10.5)
nRBC: 0 % (ref 0.0–0.2)

## 2020-06-14 LAB — URINALYSIS, ROUTINE W REFLEX MICROSCOPIC
Bilirubin Urine: NEGATIVE
Glucose, UA: NEGATIVE mg/dL
Hgb urine dipstick: NEGATIVE
Ketones, ur: NEGATIVE mg/dL
Leukocytes,Ua: NEGATIVE
Nitrite: NEGATIVE
Protein, ur: NEGATIVE mg/dL
Specific Gravity, Urine: 1.002 — ABNORMAL LOW (ref 1.005–1.030)
pH: 8 (ref 5.0–8.0)

## 2020-06-14 LAB — COMPREHENSIVE METABOLIC PANEL
ALT: 22 U/L (ref 0–44)
AST: 19 U/L (ref 15–41)
Albumin: 4 g/dL (ref 3.5–5.0)
Alkaline Phosphatase: 33 U/L — ABNORMAL LOW (ref 38–126)
Anion gap: 10 (ref 5–15)
BUN: 10 mg/dL (ref 6–20)
CO2: 21 mmol/L — ABNORMAL LOW (ref 22–32)
Calcium: 9.9 mg/dL (ref 8.9–10.3)
Chloride: 112 mmol/L — ABNORMAL HIGH (ref 98–111)
Creatinine, Ser: 0.89 mg/dL (ref 0.44–1.00)
GFR, Estimated: >60 mL/min (ref >60)
Glucose, Bld: 104 mg/dL — ABNORMAL HIGH (ref 70–99)
Potassium: 3.3 mmol/L — ABNORMAL LOW (ref 3.5–5.1)
Sodium: 143 mmol/L (ref 135–145)
Total Bilirubin: <0.1 mg/dL — ABNORMAL LOW (ref 0.3–1.2)
Total Protein: 8.7 g/dL — ABNORMAL HIGH (ref 6.5–8.1)

## 2020-06-14 LAB — I-STAT BETA HCG BLOOD, ED (MC, WL, AP ONLY): I-stat hCG, quantitative: <5 m[IU]/mL (ref <5)

## 2020-06-14 LAB — LIPASE, BLOOD: Lipase: 41 U/L (ref 11–51)

## 2020-06-14 MED ORDER — DICYCLOMINE HCL 10 MG/ML IM SOLN
20.0000 mg | Freq: Once | INTRAMUSCULAR | Status: AC
  Administered 2020-06-14: 20 mg via INTRAMUSCULAR
  Filled 2020-06-14: qty 2

## 2020-06-14 MED ORDER — ONDANSETRON 4 MG PO TBDP: 4.0000 mg | ORAL_TABLET | Freq: Three times a day (TID) | ORAL | 0 refills | Status: DC | PRN

## 2020-06-14 MED ORDER — LACTATED RINGERS IV BOLUS
1000.0000 mL | Freq: Once | INTRAVENOUS | Status: AC
  Administered 2020-06-14: 1000 mL via INTRAVENOUS

## 2020-06-14 MED ORDER — DICYCLOMINE HCL 20 MG PO TABS: 20.0000 mg | ORAL_TABLET | Freq: Three times a day (TID) | ORAL | 0 refills | Status: DC

## 2020-06-14 MED ORDER — ONDANSETRON HCL 4 MG/2ML IJ SOLN
4.0000 mg | Freq: Once | INTRAMUSCULAR | Status: AC
  Administered 2020-06-14: 4 mg via INTRAVENOUS
  Filled 2020-06-14: qty 2

## 2020-06-14 MED ORDER — LORAZEPAM 2 MG/ML IJ SOLN
1.0000 mg | Freq: Once | INTRAMUSCULAR | Status: AC
  Administered 2020-06-14: 1 mg via INTRAVENOUS
  Filled 2020-06-14: qty 1

## 2020-06-14 NOTE — ED Triage Notes (Signed)
Pt came in with nausea and abdominal pain times two weeks. Pt was seen here for same approx two weeks ago. Pt states that she felt like she was starting to feel better last week, but the nausea and lower quadrant abdominal pain started back a few days ago

## 2020-06-14 NOTE — ED Provider Notes (Signed)
[facility]-EMERGENCY DEPT Provider Note   CSN: [id number] Arrival date & time: 06/14/20  0553     History Chief Complaint  Patient presents with  . Abdominal Pain    Faith Ross is a 30 y.o. female presents to the ED for evaluation of abdominal pain.  Onset 3 to 4 weeks ago.  Associated with nausea but no vomiting.  Her stools have been looser and dark.  Nothing makes the pain better or worse.  She came to the ED 2 weeks ago for the same.  She was told that it could have been a virus.  At that time she had nausea, vomiting and diarrhea as well as abdominal pain.  Her aunt had told her that there was a virus going around as well.  She was discharged with Zofran but states it did not help.  Denies fevers, chills, vomiting, obvious blood in her stool.  Denies dysuria, hematuria.  No vaginal symptoms like abnormal vaginal bleeding, discharge.  No history of abdominal surgeries.  Denies alcohol use, recreational drug use including marijuana.  Denies known history of GI issues specifically IBS, colitis.  No recent use of antibiotics. She has not been able to follow-up with her PCP or gastroenterology.  Patient states she is very anxious right now and feels like she is going to have a panic attack.  States her mouth is really dry and would like some water.  No chest pain or shortness of breath.  HPI     Past Medical History:  Diagnosis Date  . Alopecia   . Anxiety   . Chest wall pain     Patient Active Problem List   Diagnosis Date Noted  . Anxiety 12/02/2017  . Chest pain 12/02/2017  . Palpitations 12/02/2017    Past Surgical History:  Procedure Laterality Date  . TONSILLECTOMY       OB History    Gravida  0   Para  0   Term  0   Preterm  0   AB  0   Living  0     SAB  0   IAB  0   Ectopic  0   Multiple  0   Live Births  0           Family History  Problem Relation Age of Onset  . Thyroid disease Mother     Social History    Tobacco Use  . Smoking status: Never Smoker  . Smokeless tobacco: Never Used  Vaping Use  . Vaping Use: Never used  Substance Use Topics  . Alcohol use: Not Currently  . Drug use: Never    Home Medications Prior to Admission medications   Medication Sig Start Date End Date Taking? Authorizing Provider  dicyclomine (BENTYL) 20 MG tablet Take 1 tablet (20 mg total) by mouth 4 (four) times daily -  before meals and at bedtime. 06/14/20  Yes Sharen Heck J, PA-C  ondansetron (ZOFRAN ODT) 4 MG disintegrating tablet Take 1 tablet (4 mg total) by mouth every 8 (eight) hours as needed for nausea or vomiting. 06/14/20  Yes Liberty Handy, PA-C  betamethasone valerate ointment (VALISONE) 0.1 % Apply a pea sized amount topically BID for up to 2 weeks as needed 06/05/19   Romualdo Bolk, MD  cyclobenzaprine (FLEXERIL) 10 MG tablet Take 1 tablet (10 mg total) by mouth 2 (two) times daily as needed for muscle spasms. 09/04/19   Robinson, [place] N, PA-C  montelukast (SINGULAIR)  10 MG tablet Take 1 tablet (10 mg total) by mouth at bedtime. 06/28/19   Arvilla Market, DO  naproxen (NAPROSYN) 500 MG tablet Take 1 tablet (500 mg total) by mouth 2 (two) times daily. 03/30/20   Horton, Mayer Masker, MD    Allergies    Patient has no known allergies.  Review of Systems   Review of Systems  Gastrointestinal: Positive for abdominal pain, diarrhea (looser stools) and nausea.  Psychiatric/Behavioral: The patient is nervous/anxious.   All other systems reviewed and are negative.   Physical Exam Updated Vital Signs BP 127/73 (BP Location: Right Arm)   Pulse 83   Temp 97.8 F (36.6 C) (Oral)   Resp 16   Wt 54.4 kg   SpO2 100%   BMI 19.37 kg/m   Physical Exam Vitals and nursing note reviewed.  Constitutional:      Appearance: She is well-developed.     Comments: Non toxic in NAD  HENT:     Head: Normocephalic and atraumatic.     Nose: Nose normal.  Eyes:      Conjunctiva/sclera: Conjunctivae normal.  Cardiovascular:     Rate and Rhythm: Normal rate and regular rhythm.  Pulmonary:     Effort: Pulmonary effort is normal.     Breath sounds: Normal breath sounds.  Abdominal:     General: Bowel sounds are normal.     Palpations: Abdomen is soft.     Tenderness: There is abdominal tenderness (mild) in the periumbilical area.     Comments: No G/R/R. No suprapubic or CVA tenderness. Negative Murphy's and McBurney's. Active BS to lower quadrants.   Musculoskeletal:        General: Normal range of motion.     Cervical back: Normal range of motion.  Skin:    General: Skin is warm and dry.     Capillary Refill: Capillary refill takes less than 2 seconds.  Neurological:     Mental Status: She is alert.  Psychiatric:        Behavior: Behavior normal.     ED Results / Procedures / Treatments   Labs (all labs ordered are listed, but only abnormal results are displayed) Labs Reviewed  CBC WITH DIFFERENTIAL/PLATELET - Abnormal; Notable for the following components:      Result Value   WBC 2.6 (*)    RBC 3.66 (*)    Hemoglobin 11.1 (*)    HCT 31.9 (*)    Neutro Abs 1.0 (*)    All other components within normal limits  COMPREHENSIVE METABOLIC PANEL - Abnormal; Notable for the following components:   Potassium 3.3 (*)    Chloride 112 (*)    CO2 21 (*)    Glucose, Bld 104 (*)    Total Protein 8.7 (*)    Alkaline Phosphatase 33 (*)    Total Bilirubin <0.1 (*)    All other components within normal limits  URINALYSIS, ROUTINE W REFLEX MICROSCOPIC - Abnormal; Notable for the following components:   Color, Urine COLORLESS (*)    Specific Gravity, Urine 1.002 (*)    All other components within normal limits  LIPASE, BLOOD  I-STAT BETA HCG BLOOD, ED ([facility], [facility], [facility] ONLY)    EKG None  Radiology No results found.  Procedures Procedures   Medications Ordered in ED Medications  lactated ringers bolus 1,000 mL (1,000 mLs Intravenous New  Bag/Given 06/14/20 0710)  ondansetron (ZOFRAN) injection 4 mg (4 mg Intravenous Given 06/14/20 0711)  dicyclomine (BENTYL) injection 20 mg (  20 mg Intramuscular Given 06/14/20 0711)  LORazepam (ATIVAN) injection 1 mg (1 mg Intravenous Given 06/14/20 2671)    ED Course  I have reviewed the triage vital signs and the nursing notes.  Pertinent labs & imaging results that were available during my care of the patient were reviewed by me and considered in my medical decision making (see chart for details).  Clinical Course as of 06/14/20 0832  Fri Jun 14, 2020  0722 WBC(!): 2.6 [CG]  0722 Hemoglobin(!): 11.1 [CG]  0722 Potassium(!): 3.3 [CG]  0722 Creatinine: 0.89 [CG]  0722 Alkaline Phosphatase(!): 33 [CG]  0722 ALT: 22 [CG]  0722 AST: 19 [CG]    Clinical Course User Index [CG] Jerrell Mylar   MDM Rules/Calculators/A&P                          30 year old female presents to the ED for nausea, paramedical abdominal pain, loose stools.  Similar episode 2 weeks ago once in the ED for the same.  EMR, triage nurse notes reviewed  DDx-viral gastroenteritis versus IBS with diarrhea.  Given duration of symptoms for several weeks, also considering some type of diet/food intolerance.  She has no history of colitis.  Considered surgical process like cholecystitis, appendicitis, SBO less likely given her presentation, duration of symptoms and initial abdominal exam.  Labs ordered by me, treat symptoms, reassess  Labs personally visualized and interpreted.  Labs reveal- mild leukopenia WBC 2.6, hemoglobin 11.1, HCT 31.9 in setting of known anemia.  K is 3.3.  LFTs, lipase, creatinine unremarkable.  Urinalysis without infection  0830: Patient reevaluated and she reports complete resolution of abdominal pain, nausea.  She is tolerating fluids without vomiting.  Repeat abdominal exam is benign without tenderness.  Reasonable to discharge patient with symptomatic management, GI referral.   Cause of symptoms unclear at this time.  Discussed with patient need for GI follow-up, return precautions given.  Patient is comfortable with this plan. Final Clinical Impression(s) / ED Diagnoses Final diagnoses:  Nausea vomiting and diarrhea    Rx / DC Orders ED Discharge Orders         Ordered    Ambulatory referral to Gastroenterology        06/14/20 0827    dicyclomine (BENTYL) 20 MG tablet  3 times daily before meals & bedtime        06/14/20 0827    ondansetron (ZOFRAN ODT) 4 MG disintegrating tablet  Every 8 hours PRN        06/14/20 0827           Liberty Handy, PA-C 06/14/20 2458    Margarita Grizzle, MD 06/17/20 1346

## 2020-06-14 NOTE — Discharge Instructions (Signed)
You were seen in the emergency department for nausea, vomiting, abdominal pain and loose stools for the last 3 to 4 weeks  Lab work and urinalysis today were normal  The cause of your symptoms is still unclear  I have placed an ambulatory referral to gastroenterology.  I have also given you the contact information for this clinic, if you do not hear from them in the next week please call them to confirm an appointment  For now we will treat your symptoms with Zofran for nausea, Bentyl for gut spasms.  You can also take acetaminophen (Tylenol) as needed for pain.  Stay hydrated.  Avoid any irritating, spicy or greasy foods, alcohol, marijuana as all of these will worsen your symptoms  Return to the ED for worsening or localized abdominal pain in the right upper or right lower quadrants, fever greater than 100.4, persistent vomiting despite nausea medicine, blood in your vomit, bloody or black stools

## 2020-06-16 ENCOUNTER — Other Ambulatory Visit: Payer: Self-pay

## 2020-06-16 ENCOUNTER — Ambulatory Visit
Admission: EM | Admit: 2020-06-16 | Discharge: 2020-06-16 | Disposition: A | Discharge status: Home / Self Care | Payer: Medicaid Other | Attending: Emergency Medicine | Admitting: Emergency Medicine

## 2020-06-16 ENCOUNTER — Encounter: Payer: Self-pay | Admitting: Emergency Medicine

## 2020-06-16 DIAGNOSIS — R112 Nausea with vomiting, unspecified: Secondary | ICD-10-CM | POA: Insufficient documentation

## 2020-06-16 DIAGNOSIS — R195 Other fecal abnormalities: Secondary | ICD-10-CM | POA: Insufficient documentation

## 2020-06-16 LAB — POCT URINALYSIS DIP (MANUAL ENTRY)
Bilirubin, UA: NEGATIVE
Blood, UA: NEGATIVE
Glucose, UA: NEGATIVE mg/dL
Ketones, POC UA: NEGATIVE mg/dL
Nitrite, UA: NEGATIVE
Protein Ur, POC: NEGATIVE mg/dL
Spec Grav, UA: 1.015 (ref 1.010–1.025)
Urobilinogen, UA: 0.2 E.U./dL
pH, UA: 7 (ref 5.0–8.0)

## 2020-06-16 NOTE — Discharge Instructions (Addendum)
Your exam is reassuring here today.  Continue with use of zofran as needed.  Small frequent sips of fluids- Pedialyte, Gatorade, water, broth- to maintain hydration.   Advance diet slowly to see how you tolerate, can keep a symptom log to bring to your primary care provider.  We will call you if there are any concerning findings with your labs.  Please follow up with your primary care provider if symptoms persist. Go to the ER if symptoms worsen.

## 2020-06-16 NOTE — ED Provider Notes (Signed)
[facility]    CSN: [id number] Arrival date & time: 06/16/20  1415      History   Chief Complaint Chief Complaint  Patient presents with  . Diarrhea    HPI [facility] is a 30 y.o. female.   [facility] presents with complaints of nausea and loose stools. Ongoing now for 3-4 weeks. Has been to the ER twice without acute findings. Decreased solid intake but has been taking liquids. No abdominal pain. No cough or URI symptoms. Originally had vomiting but this has subsided. No urinary symptoms. Her sister tested positive for covid-19 but she tested at [facility] and it was negative. Has been given zofran and bentyl which she hasn't been using. No recent travel. No recent antibiotics. She feels she has had cervical lymphadenopathy and nasal congestion as well. Doesn't follow with a PCP as she no longer has insurance.     ROS per HPI, negative if not otherwise mentioned.      Past Medical History:  Diagnosis Date  . Alopecia   . Anxiety   . Chest wall pain     Patient Active Problem List   Diagnosis Date Noted  . Anxiety 12/02/2017  . Chest pain 12/02/2017  . Palpitations 12/02/2017    Past Surgical History:  Procedure Laterality Date  . TONSILLECTOMY      OB History    Gravida  0   Para  0   Term  0   Preterm  0   AB  0   Living  0     SAB  0   IAB  0   Ectopic  0   Multiple  0   Live Births  0            Home Medications    Prior to Admission medications   Medication Sig Start Date End Date Taking? Authorizing Provider  betamethasone valerate ointment (VALISONE) 0.1 % Apply a pea sized amount topically BID for up to 2 weeks as needed 06/05/19   Romualdo Bolk, MD  cyclobenzaprine (FLEXERIL) 10 MG tablet Take 1 tablet (10 mg total) by mouth 2 (two) times daily as needed for muscle spasms. 09/04/19   Robinson, [place] N, PA-C  dicyclomine (BENTYL) 20 MG tablet Take 1 tablet (20 mg total) by mouth 4 (four) times daily  -  before meals and at bedtime. 06/14/20   Liberty Handy, PA-C  montelukast (SINGULAIR) 10 MG tablet Take 1 tablet (10 mg total) by mouth at bedtime. 06/28/19   Arvilla Market, DO  naproxen (NAPROSYN) 500 MG tablet Take 1 tablet (500 mg total) by mouth 2 (two) times daily. 03/30/20   Horton, Mayer Masker, MD  ondansetron (ZOFRAN ODT) 4 MG disintegrating tablet Take 1 tablet (4 mg total) by mouth every 8 (eight) hours as needed for nausea or vomiting. 06/14/20   Liberty Handy, PA-C    Family History Family History  Problem Relation Age of Onset  . Thyroid disease Mother     Social History Social History   Tobacco Use  . Smoking status: Never Smoker  . Smokeless tobacco: Never Used  Vaping Use  . Vaping Use: Never used  Substance Use Topics  . Alcohol use: Not Currently  . Drug use: Never     Allergies   Patient has no known allergies.   Review of Systems Review of Systems   Physical Exam Triage Vital Signs ED Triage Vitals  Enc Vitals Group  BP 06/16/20 1449 105/67     Pulse Rate 06/16/20 1449 76     Resp 06/16/20 1449 20     Temp 06/16/20 1449 98.6 F (37 C)     Temp Source 06/16/20 1449 Oral     SpO2 06/16/20 1449 98 %     Weight --      Height --      Head Circumference --      Peak Flow --      Pain Score 06/16/20 1444 0     Pain Loc --      Pain Edu? --      Excl. in GC? --    No data found.  Updated Vital Signs BP 105/67 (BP Location: Right Arm)   Pulse 76   Temp 98.6 F (37 C) (Oral)   Resp 20   LMP 06/08/2020   SpO2 98%   Visual Acuity Right Eye Distance:   Left Eye Distance:   Bilateral Distance:    Right Eye Near:   Left Eye Near:    Bilateral Near:     Physical Exam Constitutional:      General: She is not in acute distress.    Appearance: She is well-developed.  HENT:     Right Ear: Tympanic membrane normal.     Left Ear: Tympanic membrane normal.     Mouth/Throat:     Mouth: Mucous membranes are moist.   Cardiovascular:     Rate and Rhythm: Normal rate and regular rhythm.  Pulmonary:     Effort: Pulmonary effort is normal.     Breath sounds: Normal breath sounds.  Abdominal:     General: There is no distension.     Palpations: There is no mass.     Tenderness: There is no abdominal tenderness. There is no guarding or rebound.  Skin:    General: Skin is warm and dry.  Neurological:     Mental Status: She is alert and oriented to person, place, and time.      UC Treatments / Results  Labs (all labs ordered are listed, but only abnormal results are displayed) Labs Reviewed  GASTROINTESTINAL PANEL BY PCR, STOOL (REPLACES STOOL CULTURE)  CBC WITH DIFFERENTIAL/PLATELET  COMPREHENSIVE METABOLIC PANEL  POCT URINALYSIS DIP (MANUAL ENTRY)    EKG   Radiology No results found.  Procedures Procedures (including critical care time)  Medications Ordered in UC Medications - No data to display  Initial Impression / Assessment and Plan / UC Course  I have reviewed the triage vital signs and the nursing notes.  Pertinent labs & imaging results that were available during my care of the patient were reviewed by me and considered in my medical decision making (see chart for details).     Non toxic. Benign physical exam.  Vitals stable. Hasn't had active diarrhea or vomiting in a few days. Afebrile. No acute pain. Somewhat vague symptoms. Repeat labs to compare to recent ER visit obtained. Stool panel ordered, patient will try to return this as able. Encouraged symptom journal to see if her symptoms are related to diet/behavior etc. Encouraged to follow up with pcp and options provided. Patient verbalized understanding and agreeable to plan.   Final Clinical Impressions(s) / UC Diagnoses   Final diagnoses:  Non-intractable vomiting with nausea, unspecified vomiting type   Discharge Instructions   None    ED Prescriptions    None     PDMP not reviewed this encounter.   Linus Mako  B, NP 06/17/20 [phone]

## 2020-06-16 NOTE — ED Triage Notes (Signed)
Patient reports vomiting and diarrhea since April 6.  States she has been to the hospital 2 times since then.  Denies feeling better, pain is no worse.  Also complains of sinus congestion and swollen lymph nodes

## 2020-06-17 ENCOUNTER — Telehealth: Payer: Self-pay | Admitting: *Deleted

## 2020-06-17 NOTE — Telephone Encounter (Signed)
Transition Care Management Unsuccessful Follow-up Telephone Call  Date of discharge and from where:  06/14/2020 [facility]  Attempts:  1st Attempt  Reason for unsuccessful TCM follow-up call:  Voice mail full

## 2020-06-18 LAB — CBC WITH DIFFERENTIAL/PLATELET
Basophils Absolute: 0 10*3/uL (ref 0.0–0.2)
Basos: 0 %
EOS (ABSOLUTE): 0 10*3/uL (ref 0.0–0.4)
Eos: 1 %
Hematocrit: 34.2 % (ref 34.0–46.6)
Hemoglobin: 11.4 g/dL (ref 11.1–15.9)
Immature Grans (Abs): 0 10*3/uL (ref 0.0–0.1)
Immature Granulocytes: 0 %
Lymphocytes Absolute: 1.2 10*3/uL (ref 0.7–3.1)
Lymphs: 51 %
MCH: 30.2 pg (ref 26.6–33.0)
MCHC: 33.3 g/dL (ref 31.5–35.7)
MCV: 91 fL (ref 79–97)
Monocytes Absolute: 0.3 10*3/uL (ref 0.1–0.9)
Monocytes: 11 %
Neutrophils Absolute: 0.9 10*3/uL — ABNORMAL LOW (ref 1.4–7.0)
Neutrophils: 37 %
Platelets: 229 10*3/uL (ref 150–450)
RBC: 3.78 x10E6/uL (ref 3.77–5.28)
RDW: 12.2 % (ref 11.7–15.4)
WBC: 2.3 10*3/uL — CL (ref 3.4–10.8)

## 2020-06-18 LAB — COMPREHENSIVE METABOLIC PANEL
ALT: 23 IU/L (ref 0–32)
AST: 22 IU/L (ref 0–40)
Albumin/Globulin Ratio: 1.1 — ABNORMAL LOW (ref 1.2–2.2)
Albumin: 4.5 g/dL (ref 3.9–5.0)
Alkaline Phosphatase: 40 IU/L — ABNORMAL LOW (ref 44–121)
BUN/Creatinine Ratio: 7 — ABNORMAL LOW (ref 9–23)
BUN: 6 mg/dL (ref 6–20)
Bilirubin Total: 0.3 mg/dL (ref 0.0–1.2)
CO2: 21 mmol/L (ref 20–29)
Calcium: 9.5 mg/dL (ref 8.7–10.2)
Chloride: 103 mmol/L (ref 96–106)
Creatinine, Ser: 0.89 mg/dL (ref 0.57–1.00)
Globulin, Total: 4.2 g/dL (ref 1.5–4.5)
Glucose: 81 mg/dL (ref 65–99)
Potassium: 3.9 mmol/L (ref 3.5–5.2)
Sodium: 138 mmol/L (ref 134–144)
Total Protein: 8.7 g/dL — ABNORMAL HIGH (ref 6.0–8.5)
eGFR: 89 mL/min/{1.73_m2} (ref >59)

## 2020-06-18 NOTE — Telephone Encounter (Signed)
Transition Care Management Unsuccessful Follow-up Telephone Call  Date of discharge and from where:  06/14/2020 - [facility]  Attempts:  2nd Attempt  Reason for unsuccessful TCM follow-up call:  Voice mail full

## 2020-06-19 ENCOUNTER — Encounter (HOSPITAL_COMMUNITY): Payer: Self-pay

## 2020-06-19 ENCOUNTER — Emergency Department (HOSPITAL_COMMUNITY)
Admission: EM | Admit: 2020-06-19 | Discharge: 2020-06-20 | Disposition: A | Discharge status: Home / Self Care | Payer: Medicaid Other | Attending: Emergency Medicine | Admitting: Emergency Medicine

## 2020-06-19 ENCOUNTER — Other Ambulatory Visit: Payer: Self-pay

## 2020-06-19 DIAGNOSIS — R1030 Lower abdominal pain, unspecified: Secondary | ICD-10-CM | POA: Insufficient documentation

## 2020-06-19 DIAGNOSIS — R197 Diarrhea, unspecified: Secondary | ICD-10-CM | POA: Insufficient documentation

## 2020-06-19 DIAGNOSIS — Z5321 Procedure and treatment not carried out due to patient leaving prior to being seen by health care provider: Secondary | ICD-10-CM | POA: Insufficient documentation

## 2020-06-19 DIAGNOSIS — R1013 Epigastric pain: Secondary | ICD-10-CM | POA: Insufficient documentation

## 2020-06-19 DIAGNOSIS — R109 Unspecified abdominal pain: Secondary | ICD-10-CM

## 2020-06-19 LAB — GASTROINTESTINAL PANEL BY PCR, STOOL (REPLACES STOOL CULTURE)

## 2020-06-19 NOTE — Telephone Encounter (Signed)
Transition Care Management Unsuccessful Follow-up Telephone Call  Date of discharge and from where:  06/14/2020 - [facility]  Attempts:  3rd Attempt  Reason for unsuccessful TCM follow-up call:  Voice mail full

## 2020-06-19 NOTE — ED Triage Notes (Signed)
Emergency Medicine Provider Triage Evaluation Note  [facility] , a 30 y.o. female  was evaluated in triage.  Pt complains of abd pain.  Review of Systems  Positive: Epigastric pain, loose stools, red eyes Negative: Fever, cough, dysuria  Physical Exam  BP 117/79 (BP Location: Left Arm)   Pulse (!) 108   Temp 98.9 F (37.2 C) (Oral)   Resp 18   Ht 5\' 6"  (1.676 m)   Wt 54.4 kg   LMP 06/08/2020   SpO2 100%   BMI 19.37 kg/m  Gen:   Awake, no distress   HEENT:  Atraumatic, injected conjunctiva bilaterally Resp:  Normal effort  Cardiac:  Normal rate  Abd:   Epigastric tenderness MSK:   Moves extremities without difficulty  Neuro:  Speech clear   Medical Decision Making  Medically screening exam initiated at 10:50 PM.  Appropriate orders placed.  [facility] was informed that the remainder of the evaluation will be completed by another provider, this initial triage assessment does not replace that evaluation, and the importance of remaining in the ED until their evaluation is complete.  Clinical Impression  Recurrent abd pain x 1 month, seen in the ER multiple times for same.     Faith Pies, PA-C 06/19/20 2252

## 2020-06-19 NOTE — ED Triage Notes (Signed)
Pt reports upper and lower sharp abdominal pains since 05/29/20. Denies recent n/v/d. Sts she did on previous visit though.

## 2020-06-20 NOTE — ED Notes (Signed)
Pt reports ride is on the way. Does not wish to proceed to treatment room.

## 2020-06-21 ENCOUNTER — Telehealth: Payer: Self-pay

## 2020-06-21 NOTE — Telephone Encounter (Signed)
Transition Care Management Unsuccessful Follow-up Telephone Call  Date of discharge and from where:  06/20/2020 from [place]  Attempts:  1st Attempt  Reason for unsuccessful TCM follow-up call:  Unable to leave message     

## 2020-06-24 NOTE — Telephone Encounter (Signed)
Transition Care Management Unsuccessful Follow-up Telephone Call  Date of discharge and from where:  06/20/2020 from [place]  Attempts:  2nd Attempt  Reason for unsuccessful TCM follow-up call:  Unable to leave message     

## 2020-06-25 ENCOUNTER — Other Ambulatory Visit: Payer: Self-pay

## 2020-06-25 ENCOUNTER — Ambulatory Visit
Admission: EM | Admit: 2020-06-25 | Discharge: 2020-06-25 | Disposition: A | Discharge status: Home / Self Care | Payer: 59

## 2020-06-25 DIAGNOSIS — H1013 Acute atopic conjunctivitis, bilateral: Secondary | ICD-10-CM

## 2020-06-25 DIAGNOSIS — J301 Allergic rhinitis due to pollen: Secondary | ICD-10-CM

## 2020-06-25 MED ORDER — ERYTHROMYCIN 5 MG/GM OP OINT: TOPICAL_OINTMENT | OPHTHALMIC | 0 refills | Status: DC

## 2020-06-25 MED ORDER — KETOTIFEN FUMARATE 0.025 % OP SOLN: 1.0000 [drp] | Freq: Two times a day (BID) | OPHTHALMIC | 0 refills | Status: DC

## 2020-06-25 MED ORDER — PREDNISONE 20 MG PO TABS: 40.0000 mg | ORAL_TABLET | Freq: Every day | ORAL | 0 refills | Status: DC

## 2020-06-25 NOTE — Telephone Encounter (Signed)
Transition Care Management Unsuccessful Follow-up Telephone Call  Date of discharge and from where:  06/20/2020 from [facility]  Attempts:  3rd Attempt  Reason for unsuccessful TCM follow-up call:  Unable to reach patient

## 2020-06-25 NOTE — ED Triage Notes (Signed)
Pt reports redness, pain and swelling and itchiness in the eye; crusted eyes in the morning  x 2 weeks. States OTC eye drops, Benadryl and Claritin gives no relief.

## 2020-06-25 NOTE — Discharge Instructions (Signed)
Prednisone 40 mg daily for the next 5 days May use erythromycin ointment 4 times daily to cover any bacterial cause of conjunctivitis Ketotifen or olopatadine drops for further allergy eye relief  Follow-up if not improving or worsening

## 2020-06-25 NOTE — ED Provider Notes (Signed)
[facility]    CSN: [id number] Arrival date & time: 06/25/20  1650      History   Chief Complaint Chief Complaint  Patient presents with  . Eye Problem    HPI Maleeyah Mccaughey is a 30 y.o. female presenting today for evaluation of bilateral eye redness and soreness.  Patient reports over the past 1 to 2 weeks she has had discomfort to bilateral eyes.  Does report some mucousy drainage throughout the day with some crusting.  Reports associated itching and some mild swelling.  Using over-the-counter eyedrops, Benadryl and Claritin without relief.  Reports using olopatadine in the past without significant relief reports history of similar and improving with erythromycin ointment.  Also reports associated sinus congestion and allergy symptoms for approximately 2 weeks. Denies contact use.   HPI  Past Medical History:  Diagnosis Date  . Alopecia   . Anxiety   . Chest wall pain     Patient Active Problem List   Diagnosis Date Noted  . Anxiety 12/02/2017  . Chest pain 12/02/2017  . Palpitations 12/02/2017    Past Surgical History:  Procedure Laterality Date  . TONSILLECTOMY      OB History    Gravida  0   Para  0   Term  0   Preterm  0   AB  0   Living  0     SAB  0   IAB  0   Ectopic  0   Multiple  0   Live Births  0            Home Medications    Prior to Admission medications   Medication Sig Start Date End Date Taking? Authorizing Provider  erythromycin ophthalmic ointment Place a 1/2 inch ribbon of ointment into the lower eyelid 4 times daily x 1 week 06/25/20  Yes Kenya Kook C, PA-C  ketotifen (ZADITOR) 0.025 % ophthalmic solution Place 1 drop into both eyes 2 (two) times daily. 06/25/20  Yes Caius Silbernagel C, PA-C  medroxyPROGESTERone (DEPO-PROVERA) 150 MG/ML injection Inject 150 mg into the muscle every 3 (three) months.   Yes [provider]  predniSONE (DELTASONE) 20 MG tablet Take 2 tablets (40 mg total) by mouth  daily with breakfast for 5 days. 06/25/20 06/30/20 Yes Aliz Meritt C, PA-C  betamethasone valerate ointment (VALISONE) 0.1 % Apply a pea sized amount topically BID for up to 2 weeks as needed 06/05/19   Romualdo Bolk, MD  cyclobenzaprine (FLEXERIL) 10 MG tablet Take 1 tablet (10 mg total) by mouth 2 (two) times daily as needed for muscle spasms. 09/04/19   Robinson, [place] N, PA-C  dicyclomine (BENTYL) 20 MG tablet Take 1 tablet (20 mg total) by mouth 4 (four) times daily -  before meals and at bedtime. 06/14/20   Liberty Handy, PA-C  montelukast (SINGULAIR) 10 MG tablet Take 1 tablet (10 mg total) by mouth at bedtime. 06/28/19   Arvilla Market, DO  naproxen (NAPROSYN) 500 MG tablet Take 1 tablet (500 mg total) by mouth 2 (two) times daily. 03/30/20   Horton, Mayer Masker, MD  ondansetron (ZOFRAN ODT) 4 MG disintegrating tablet Take 1 tablet (4 mg total) by mouth every 8 (eight) hours as needed for nausea or vomiting. 06/14/20   Liberty Handy, PA-C    Family History Family History  Problem Relation Age of Onset  . Thyroid disease Mother     Social History Social History   Tobacco Use  .  Smoking status: Never Smoker  . Smokeless tobacco: Never Used  Vaping Use  . Vaping Use: Never used  Substance Use Topics  . Alcohol use: Not Currently  . Drug use: Never     Allergies   Patient has no known allergies.   Review of Systems Review of Systems  Constitutional: Negative for activity change, appetite change, chills, fatigue and fever.  HENT: Positive for congestion, rhinorrhea and sinus pressure. Negative for ear pain, sore throat and trouble swallowing.   Eyes: Positive for discharge, redness and itching.  Respiratory: Negative for cough, chest tightness and shortness of breath.   Cardiovascular: Negative for chest pain.  Gastrointestinal: Negative for abdominal pain, diarrhea, nausea and vomiting.  Musculoskeletal: Negative for myalgias.  Skin: Negative for  rash.  Neurological: Negative for dizziness, light-headedness and headaches.     Physical Exam Triage Vital Signs ED Triage Vitals  Enc Vitals Group     BP      Pulse      Resp      Temp      Temp src      SpO2      Weight      Height      Head Circumference      Peak Flow      Pain Score      Pain Loc      Pain Edu?      Excl. in GC?    No data found.  Updated Vital Signs BP 113/66 (BP Location: Left Arm)   Pulse 74   Temp 98.1 F (36.7 C) (Oral)   Resp 17   LMP 06/08/2020   SpO2 100%   Visual Acuity Right Eye Distance: 20/25 (Without correction) Left Eye Distance: 20/30 (Without correction) Bilateral Distance: 20/25 (Without correction)  Right Eye Near:   Left Eye Near:    Bilateral Near:     Physical Exam Vitals and nursing note reviewed.  Constitutional:      Appearance: She is well-developed.     Comments: No acute distress  HENT:     Head: Normocephalic and atraumatic.     Ears:     Comments: Bilateral ears without tenderness to palpation of external auricle, tragus and mastoid, EAC's without erythema or swelling, TM's with good bony landmarks and cone of light. Non erythematous.     Nose: Nose normal.     Mouth/Throat:     Comments: Oral mucosa pink and moist, no tonsillar enlargement or exudate. Posterior pharynx patent and nonerythematous, no uvula deviation or swelling. Normal phonation.  Eyes:     Extraocular Movements: Extraocular movements intact.     Pupils: Pupils are equal, round, and reactive to light.     Comments: Bilateral eyes with conjunctival erythema, limbus clear, anterior chamber clear, no periorbital swelling or erythema, no discharge noted  Cardiovascular:     Rate and Rhythm: Normal rate.  Pulmonary:     Effort: Pulmonary effort is normal. No respiratory distress.  Abdominal:     General: There is no distension.  Musculoskeletal:        General: Normal range of motion.     Cervical back: Neck supple.  Skin:     General: Skin is warm and dry.  Neurological:     Mental Status: She is alert and oriented to person, place, and time.      UC Treatments / Results  Labs (all labs ordered are listed, but only abnormal results are displayed) Labs Reviewed - No  data to display  EKG   Radiology No results found.  Procedures Procedures (including critical care time)  Medications Ordered in UC Medications - No data to display  Initial Impression / Assessment and Plan / UC Course  I have reviewed the triage vital signs and the nursing notes.  Pertinent labs & imaging results that were available during my care of the patient were reviewed by me and considered in my medical decision making (see chart for details).    Conjunctivitis-suspect likely allergic, but given length of symptoms as well as prior benefit from erythromycin ointment will provide this as well as try alternative antihistamine eyedrop to olopatadine.  Recommended to continue allergy meds and will try course of prednisone to further help with underlying sinus inflammation from allergies.  Discussed strict return precautions. Patient verbalized understanding and is agreeable with plan.   Final Clinical Impressions(s) / UC Diagnoses   Final diagnoses:  Seasonal allergic rhinitis due to pollen  Allergic conjunctivitis of both eyes     Discharge Instructions     Prednisone 40 mg daily for the next 5 days May use erythromycin ointment 4 times daily to cover any bacterial cause of conjunctivitis Ketotifen or olopatadine drops for further allergy eye relief  Follow-up if not improving or worsening    ED Prescriptions    Medication Sig Dispense Auth. Provider   erythromycin ophthalmic ointment Place a 1/2 inch ribbon of ointment into the lower eyelid 4 times daily x 1 week 3.5 g Merrie Epler C, PA-C   ketotifen (ZADITOR) 0.025 % ophthalmic solution Place 1 drop into both eyes 2 (two) times daily. 5 mL Timo Hartwig C,  PA-C   predniSONE (DELTASONE) 20 MG tablet Take 2 tablets (40 mg total) by mouth daily with breakfast for 5 days. 10 tablet Sabirin Baray, Page C, PA-C     PDMP not reviewed this encounter.   Mattilyn Crites, [place] C, PA-C 06/26/20 1004

## 2020-06-27 ENCOUNTER — Other Ambulatory Visit: Payer: Self-pay

## 2020-06-27 ENCOUNTER — Encounter: Payer: Self-pay | Admitting: Internal Medicine

## 2020-06-27 ENCOUNTER — Ambulatory Visit (INDEPENDENT_AMBULATORY_CARE_PROVIDER_SITE_OTHER): Payer: 59 | Admitting: Internal Medicine

## 2020-06-27 ENCOUNTER — Other Ambulatory Visit (HOSPITAL_COMMUNITY)
Admission: RE | Admit: 2020-06-27 | Discharge: 2020-06-27 | Disposition: A | Discharge status: Home / Self Care | Payer: 59 | Source: Ambulatory Visit | Attending: Internal Medicine | Admitting: Internal Medicine

## 2020-06-27 VITALS — BP 114/74 | HR 94 | Temp 97.5°F | Resp 16 | Ht 66.0 in | Wt 115.0 lb

## 2020-06-27 DIAGNOSIS — N898 Other specified noninflammatory disorders of vagina: Secondary | ICD-10-CM | POA: Insufficient documentation

## 2020-06-27 DIAGNOSIS — H1013 Acute atopic conjunctivitis, bilateral: Secondary | ICD-10-CM | POA: Diagnosis not present

## 2020-06-27 DIAGNOSIS — D72819 Decreased white blood cell count, unspecified: Secondary | ICD-10-CM

## 2020-06-27 DIAGNOSIS — R112 Nausea with vomiting, unspecified: Secondary | ICD-10-CM | POA: Diagnosis not present

## 2020-06-27 DIAGNOSIS — R197 Diarrhea, unspecified: Secondary | ICD-10-CM

## 2020-06-27 DIAGNOSIS — J309 Allergic rhinitis, unspecified: Secondary | ICD-10-CM

## 2020-06-27 NOTE — Progress Notes (Signed)
Follow up.

## 2020-06-27 NOTE — Progress Notes (Signed)
  Subjective:    [facility] - 30 y.o. female MRN [id number]  Date of birth: 05-08-1990  HPI  Cassanda Walmer is here for follow up.  Was seen at [facility] on 5/3 for allergic rhinitis and conjunctivitis. She was given Erythromycin ointment for any bacterial component of conjunctivitis. She was given a course of Prednisone for 5 days. Instructed to continue to use allergy eye drops. Reports these symptoms have improved dramatically.   Additionally, she was seen in UC on 4/22 and 4/24 for nausea, vomiting, and diarrhea. Thought to have viral gastroenteritis versus IBS with diarrhea. A GI referral was placed. No further episodes of N/V/D. Reports this has never occurred prior to this episode. Not a chronic thing.    Health Maintenance:  Health Maintenance Due  Topic Date Due  . Hepatitis C Screening  Never done  . COVID-19 Vaccine (1) Never done  . HIV Screening  Never done  . TETANUS/TDAP  Never done    -  reports that she has never smoked. She has never used smokeless tobacco. - Review of Systems: Per HPI. - Past Medical History: Patient Active Problem List   Diagnosis Date Noted  . Anxiety 12/02/2017  . Chest pain 12/02/2017  . Palpitations 12/02/2017   - Medications: reviewed and updated   Objective:   Physical Exam BP 114/74   Pulse 94   Temp (!) 97.5 F (36.4 C)   Resp 16   Ht 5\' 6"  (1.676 m)   Wt 115 lb (52.2 kg)   LMP 06/08/2020   SpO2 99%   BMI 18.56 kg/m  Physical Exam Constitutional:      General: She is not in acute distress.    Appearance: She is not diaphoretic.  Cardiovascular:     Rate and Rhythm: Normal rate.  Pulmonary:     Effort: Pulmonary effort is normal. No respiratory distress.  Musculoskeletal:        General: Normal range of motion.  Skin:    General: Skin is warm and dry.  Neurological:     Mental Status: She is alert and oriented to person, place, and time.  Psychiatric:        Mood and Affect: Affect normal.        Judgment: Judgment  normal.            Assessment & Plan:  1. Leukopenia, unspecified type WBC 2.3 with low neutrophils on diff from 4/24. 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 Discussed could repeat and send for smear today. She would like to avoid further blood draw if possible given multiple sticks this past month. Will place referral to heme.  - Ambulatory referral to Hematology / Oncology  2. Nausea vomiting and diarrhea Resolved. Suspect viral gastroenteritis given self limited, acute nature. Discussed IBS typically is chronic, intermittent in nature as patient was concerned about this possibility. Would defer GI referral at present.   3. Allergic conjunctivitis of both eyes and rhinitis Improving, finish current treatment.   4. Vaginal irritation Will obtain vaginal swab. Reports uses lubricant but still feels dry and itchy. If swab negative or no improvement after treatment for positive result, would perform exam to consider other etiologies.  - Cervicovaginal ancillary only   5/24, D.O. 06/27/2020, 9:42 AM Primary Care at [facility]

## 2020-06-28 ENCOUNTER — Telehealth: Payer: Self-pay | Admitting: *Deleted

## 2020-06-28 LAB — CERVICOVAGINAL ANCILLARY ONLY
Bacterial Vaginitis (gardnerella): POSITIVE — AB
Candida Glabrata: NEGATIVE
Candida Vaginitis: NEGATIVE
Chlamydia: NEGATIVE
Comment: NEGATIVE
Comment: NEGATIVE
Comment: NEGATIVE
Comment: NEGATIVE
Comment: NEGATIVE
Comment: NORMAL
Neisseria Gonorrhea: NEGATIVE
Trichomonas: NEGATIVE

## 2020-06-28 NOTE — Telephone Encounter (Signed)
Per referral 06/27/20 - called and unable to lvm - mailed calendar with welcome packet

## 2020-07-02 ENCOUNTER — Other Ambulatory Visit: Payer: Self-pay | Admitting: Internal Medicine

## 2020-07-02 ENCOUNTER — Telehealth: Payer: Self-pay | Admitting: Internal Medicine

## 2020-07-02 MED ORDER — METRONIDAZOLE 500 MG PO TABS: 500.0000 mg | ORAL_TABLET | Freq: Two times a day (BID) | ORAL | 0 refills | Status: DC

## 2020-07-02 NOTE — Telephone Encounter (Signed)
Pt name and DOB verified. Patient aware of results and result note per Dr. Earlene Plater.  Notified that medications are at the pharmacy to pick up.

## 2020-07-02 NOTE — Telephone Encounter (Signed)
Pt is requesting for this medication metroNIDAZOLE (FLAGYL) 500 MG tablet [[id number]]   to be sent to [facility]   [address] [place], [place], [place] [address]. She also would like a nurse to call her about lab results and why she needs to take med. Please follow up with pt regarding issue.

## 2020-07-16 ENCOUNTER — Other Ambulatory Visit: Payer: Self-pay | Admitting: Family

## 2020-07-16 DIAGNOSIS — D72819 Decreased white blood cell count, unspecified: Secondary | ICD-10-CM

## 2020-07-17 ENCOUNTER — Inpatient Hospital Stay: Payer: 59 | Attending: Family

## 2020-07-17 ENCOUNTER — Other Ambulatory Visit: Payer: Self-pay

## 2020-07-17 ENCOUNTER — Encounter: Payer: Self-pay | Admitting: Family

## 2020-07-17 ENCOUNTER — Inpatient Hospital Stay (HOSPITAL_BASED_OUTPATIENT_CLINIC_OR_DEPARTMENT_OTHER): Payer: 59 | Admitting: Family

## 2020-07-17 VITALS — BP 104/63 | HR 84 | Temp 98.6°F | Resp 16 | Ht 66.0 in | Wt 123.4 lb

## 2020-07-17 DIAGNOSIS — D72819 Decreased white blood cell count, unspecified: Secondary | ICD-10-CM | POA: Insufficient documentation

## 2020-07-17 DIAGNOSIS — Z793 Long term (current) use of hormonal contraceptives: Secondary | ICD-10-CM | POA: Diagnosis not present

## 2020-07-17 LAB — CBC WITH DIFFERENTIAL (CANCER CENTER ONLY)
Abs Immature Granulocytes: 0 10*3/uL (ref 0.00–0.07)
Basophils Absolute: 0 10*3/uL (ref 0.0–0.1)
Basophils Relative: 0 %
Eosinophils Absolute: 0.1 10*3/uL (ref 0.0–0.5)
Eosinophils Relative: 3 %
HCT: 32.2 % — ABNORMAL LOW (ref 36.0–46.0)
Hemoglobin: 11.3 g/dL — ABNORMAL LOW (ref 12.0–15.0)
Immature Granulocytes: 0 %
Lymphocytes Relative: 54 %
Lymphs Abs: 1.4 10*3/uL (ref 0.7–4.0)
MCH: 30.5 pg (ref 26.0–34.0)
MCHC: 35.1 g/dL (ref 30.0–36.0)
MCV: 86.8 fL (ref 80.0–100.0)
Monocytes Absolute: 0.2 10*3/uL (ref 0.1–1.0)
Monocytes Relative: 9 %
Neutro Abs: 0.9 10*3/uL — ABNORMAL LOW (ref 1.7–7.7)
Neutrophils Relative %: 34 %
Platelet Count: 225 10*3/uL (ref 150–400)
RBC: 3.71 MIL/uL — ABNORMAL LOW (ref 3.87–5.11)
RDW: 12.2 % (ref 11.5–15.5)
WBC Count: 2.5 10*3/uL — ABNORMAL LOW (ref 4.0–10.5)
nRBC: 0 % (ref 0.0–0.2)

## 2020-07-17 LAB — CMP (CANCER CENTER ONLY)
ALT: 15 U/L (ref 0–44)
AST: 18 U/L (ref 15–41)
Albumin: 4.3 g/dL (ref 3.5–5.0)
Alkaline Phosphatase: 29 U/L — ABNORMAL LOW (ref 38–126)
Anion gap: 7 (ref 5–15)
BUN: 9 mg/dL (ref 6–20)
CO2: 26 mmol/L (ref 22–32)
Calcium: 9.9 mg/dL (ref 8.9–10.3)
Chloride: 105 mmol/L (ref 98–111)
Creatinine: 0.87 mg/dL (ref 0.44–1.00)
GFR, Estimated: >60 mL/min (ref >60)
Glucose, Bld: 101 mg/dL — ABNORMAL HIGH (ref 70–99)
Potassium: 3.4 mmol/L — ABNORMAL LOW (ref 3.5–5.1)
Sodium: 138 mmol/L (ref 135–145)
Total Bilirubin: 0.3 mg/dL (ref 0.3–1.2)
Total Protein: 8.3 g/dL — ABNORMAL HIGH (ref 6.5–8.1)

## 2020-07-17 LAB — SAVE SMEAR(SSMR), FOR PROVIDER SLIDE REVIEW

## 2020-07-17 LAB — LACTATE DEHYDROGENASE: LDH: 131 U/L (ref 98–192)

## 2020-07-17 NOTE — Progress Notes (Signed)
Hematology/Oncology Consultation   Name: Faith Ross      MRN: [id number]    Location: Room/bed info not found  Date: 07/17/2020 Time:2:33 PM   REFERRING PHYSICIAN: Santina Evans L. 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  She denies issue with frequent or recurrent infections.  No known family history of leukopenia.  Her brother has sickle cell disease. She denies having sickle cell disease or trait.  No personal history of cancer. Family history includes maternal aunt and uncle both with unknown primary.  She notes fatigue at times and states that she has history of iron deficiency anemia. She restart her oral iron supplement daily this week. PCP is monitoring labs.  She has anxiety with panic attacks. She states that she will have palpitations and numbness and tingling in her hands with anxiety. She does not sleep well at night.  No personal history of diabetes or thyroid disease.  She is on Depo and does not have a cycle. She has some lower pelvic pain at times and has told her provider at the health department. She plans to bring this up again at her visit next month.  No bruising or petechiae.  No fever, chills, n/v, cough, rash, dizziness, SOB, chest pain or changes in bowel or bladder habits.  She was recently in the ED for n/v but states that this was felt to be due to a virus and her symptoms have since resolved.  No adenopathy or lymphedema noted on exam.  No swelling, tenderness, numbness or tingling in her extremities at this time.  She has maintained a good appetite and is staying well hydrated. Her weight is stable at 123 lbs.  No smoking, ETOH or recreational drug use.  She works for the [employer] in [employer] and enjoys her job. She is hoping to fly to [facility] for vacation soon.    ROS: All other 10 point review of  systems is negative.   PAST MEDICAL HISTORY:   Past Medical History:  Diagnosis Date  . Alopecia   . Anxiety   . Chest wall pain     ALLERGIES: No Known Allergies    MEDICATIONS:  Current Outpatient Medications on File Prior to Visit  Medication Sig Dispense Refill  . cyclobenzaprine (FLEXERIL) 10 MG tablet Take 1 tablet (10 mg total) by mouth 2 (two) times daily as needed for muscle spasms. 14 tablet 0  . erythromycin ophthalmic ointment Place a 1/2 inch ribbon of ointment into the lower eyelid 4 times daily x 1 week 3.5 g 0  . medroxyPROGESTERone (DEPO-PROVERA) 150 MG/ML injection Inject 150 mg into the muscle every 3 (three) months.    . metroNIDAZOLE (FLAGYL) 500 MG tablet Take 1 tablet (500 mg total) by mouth 2 (two) times daily. 14 tablet 0  . naproxen (NAPROSYN) 500 MG tablet Take 1 tablet (500 mg total) by mouth 2 (two) times daily. 30 tablet 0   No current facility-administered medications on file prior to visit.     PAST SURGICAL HISTORY Past Surgical History:  Procedure Laterality Date  . TONSILLECTOMY      FAMILY HISTORY: Family History  Problem Relation Age of Onset  . Thyroid disease Mother     SOCIAL HISTORY:  reports that she has never smoked. She has never used smokeless tobacco. She reports previous alcohol use. She reports that  she does not use drugs.  PERFORMANCE STATUS: The patient's performance status is 1 - Symptomatic but completely ambulatory  PHYSICAL EXAM: Most Recent Vital Signs: There were no vitals taken for this visit. BP 104/63 (BP Location: Left Arm, Patient Position: Sitting)   Pulse 84   Temp 98.6 F (37 C) (Oral)   Resp 16   Ht 5\' 6"  (1.676 m)   Wt 123 lb 6.4 oz (56 kg)   SpO2 100%   BMI 19.92 kg/m   General Appearance:    Alert, cooperative, no distress, appears stated age  Head:    Normocephalic, without obvious abnormality, atraumatic  Eyes:    PERRL, conjunctiva/corneas clear, EOM's intact, fundi    benign, both eyes         Throat:   Lips, mucosa, and tongue normal; teeth and gums normal  Neck:   Supple, symmetrical, trachea midline, no adenopathy;    thyroid:  no enlargement/tenderness/nodules; no carotid   bruit or JVD  Back:     Symmetric, no curvature, ROM normal, no CVA tenderness  Lungs:     Clear to auscultation bilaterally, respirations unlabored  Chest Wall:    No tenderness or deformity   Heart:    Regular rate and rhythm, S1 and S2 normal, no murmur, rub   or gallop     Abdomen:     Soft, non-tender, bowel sounds active all four quadrants,    no masses, no organomegaly        Extremities:   Extremities normal, atraumatic, no cyanosis or edema  Pulses:   2+ and symmetric all extremities  Skin:   Skin color, texture, turgor normal, no rashes or lesions  Lymph nodes:   Cervical, supraclavicular, and axillary nodes normal  Neurologic:   CNII-XII intact, normal strength, sensation and reflexes    throughout    LABORATORY DATA:  Results for orders placed or performed in visit on 07/17/20 (from the past 48 hour(s))  CBC with Differential (Cancer Center Only)     Status: Abnormal (Preliminary result)   Collection Time: 07/17/20  2:21 PM  Result Value Ref Range   WBC Count 2.5 (L) 4.0 - 10.5 K/uL   RBC 3.71 (L) 3.87 - 5.11 MIL/uL   Hemoglobin 11.3 (L) 12.0 - 15.0 g/dL   HCT 07/19/20 (L) 51.7 - 00.1 %   MCV 86.8 80.0 - 100.0 fL   MCH 30.5 26.0 - 34.0 pg   MCHC 35.1 30.0 - 36.0 g/dL   RDW 74.9 44.9 - 67.5 %   Platelet Count 225 150 - 400 K/uL   nRBC 0.0 0.0 - 0.2 %    Comment: Performed at [facility], [address] [place], [place]   Neutrophils Relative % PENDING %   Neutro Abs PENDING 1.7 - 7.7 K/uL   Band Neutrophils PENDING %   Lymphocytes Relative PENDING %   Lymphs Abs PENDING 0.7 - 4.0 K/uL   Monocytes Relative PENDING %   Monocytes Absolute PENDING 0.1 - 1.0 K/uL   Eosinophils Relative PENDING %   Eosinophils Absolute  PENDING 0.0 - 0.5 K/uL   Basophils Relative PENDING %   Basophils Absolute PENDING 0.0 - 0.1 K/uL   WBC Morphology PENDING    RBC Morphology PENDING    Smear Review PENDING    Other PENDING %   nRBC PENDING 0 /100 WBC   Metamyelocytes Relative PENDING %   Myelocytes PENDING %   Promyelocytes  Relative PENDING %   Blasts PENDING %   Immature Granulocytes PENDING %   Abs Immature Granulocytes PENDING 0.00 - 0.07 K/uL      RADIOGRAPHY: No results found.     PATHOLOGY: None  ASSESSMENT/PLAN: Ms. 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  Her WBC count remains stable at 2.5, Hgb 11.3, MCV 86 and platelets 225.  Labs and blood smear reviewed with Dr. Myna Hidalgo. No abnormality or evidence of malignancy noted.  At this time we will release her back to her PCP.   All questions were answered and she is in agreement. The patient knows to call the clinic with any problems, questions or concerns. We can certainly see her for any future heme/onc issues.   The patient was discussed with Dr. Myna Hidalgo and he is in agreement with the aforementioned.   Emeline Gins, NP

## 2020-07-24 ENCOUNTER — Telehealth: Payer: Self-pay | Admitting: *Deleted

## 2020-07-24 NOTE — Telephone Encounter (Signed)
Per 07/17/20 los - no follow up needed

## 2020-08-01 NOTE — Progress Notes (Signed)
Patient ID: Faith Ross, female    DOB: [birth year]  MRN: [id number]  CC: Depo-Provera Injection   Subjective: Faith Ross is a 30 y.o. female who presents for Depo-Provera injection.   Her concerns today include: none.   Patient Active Problem List   Diagnosis Date Noted   Anxiety 12/02/2017   Chest pain 12/02/2017   Palpitations 12/02/2017     Current Outpatient Medications on File Prior to Visit  Medication Sig Dispense Refill   clindamycin (CLEOCIN) 300 MG capsule Take 300 mg by mouth 3 (three) times daily.     cyclobenzaprine (FLEXERIL) 10 MG tablet Take 1 tablet (10 mg total) by mouth 2 (two) times daily as needed for muscle spasms. 14 tablet 0   erythromycin ophthalmic ointment Place a 1/2 inch ribbon of ointment into the lower eyelid 4 times daily x 1 week 3.5 g 0   medroxyPROGESTERone (DEPO-PROVERA) 150 MG/ML injection Inject 150 mg into the muscle every 3 (three) months.     metroNIDAZOLE (FLAGYL) 500 MG tablet Take 1 tablet (500 mg total) by mouth 2 (two) times daily. 14 tablet 0   naproxen (NAPROSYN) 500 MG tablet Take 1 tablet (500 mg total) by mouth 2 (two) times daily. 30 tablet 0   No current facility-administered medications on file prior to visit.    No Known Allergies  Social History   Socioeconomic History   Marital status: Single    Spouse name: Not on file   Number of children: Not on file   Years of education: Not on file   Highest education level: Not on file  Occupational History   Not on file  Tobacco Use   Smoking status: Never   Smokeless tobacco: Never  Vaping Use   Vaping Use: Never used  Substance and Sexual Activity   Alcohol use: Not Currently   Drug use: Never   Sexual activity: Yes    Birth control/protection: Injection  Other Topics Concern   Not on file  Social History Narrative   Not on file   Social Determinants of Health   Financial Resource Strain: Not on file  Food Insecurity: Not on file  Transportation  Needs: Not on file  Physical Activity: Not on file  Stress: Not on file  Social Connections: Not on file  Intimate Partner Violence: Not on file    Family History  Problem Relation Age of Onset   Thyroid disease Mother     Past Surgical History:  Procedure Laterality Date   TONSILLECTOMY      ROS: Review of Systems Negative except as stated above  PHYSICAL EXAM: BP 130/77 (BP Location: Left Arm, Patient Position: Sitting, Cuff Size: Normal)   Pulse 76   Temp 98.1 F (36.7 C)   Resp 15   Ht 5' 5.98" (1.676 m)   Wt 123 lb (55.8 kg)   SpO2 98%   BMI 19.86 kg/m   Physical Exam General appearance - alert, well appearing, and in no distress and oriented to person, place, and time Mental status - alert, oriented to person, place, and time, normal mood, behavior, speech, dress, motor activity, and thought processes Chest - clear to auscultation, no wheezes, rales or rhonchi, symmetric air entry, no tachypnea, retractions or cyanosis Heart - normal rate, regular rhythm, normal S1, S2, no murmurs, rubs, clicks or gallops   ASSESSMENT AND PLAN: 1. Encounter for Depo-Provera contraception: - Medroxyprogesterone injection administered today in office.  - Return in 12 weeks for repeat injection.  -  medroxyPROGESTERone (DEPO-PROVERA) injection 150 mg   Patient was given the opportunity to ask questions.  Patient verbalized understanding of the plan and was able to repeat key elements of the plan. Patient was given clear instructions to go to Emergency Department or return to medical center if symptoms don't improve, worsen, or new problems develop.The patient verbalized understanding.  Follow-up with primary provider in 12 weeks or sooner if needed.   Rema Fendt, NP

## 2020-08-02 ENCOUNTER — Encounter: Payer: Self-pay | Admitting: Family

## 2020-08-02 ENCOUNTER — Ambulatory Visit (INDEPENDENT_AMBULATORY_CARE_PROVIDER_SITE_OTHER): Payer: 59 | Admitting: Family

## 2020-08-02 ENCOUNTER — Other Ambulatory Visit: Payer: Self-pay

## 2020-08-02 VITALS — BP 130/77 | HR 76 | Temp 98.1°F | Resp 15 | Ht 65.98 in | Wt 123.0 lb

## 2020-08-02 DIAGNOSIS — Z3042 Encounter for surveillance of injectable contraceptive: Secondary | ICD-10-CM | POA: Diagnosis not present

## 2020-08-02 MED ORDER — MEDROXYPROGESTERONE ACETATE 150 MG/ML IM SUSP
150.0000 mg | Freq: Once | INTRAMUSCULAR | Status: AC
  Administered 2020-08-02: 150 mg via INTRAMUSCULAR

## 2020-08-02 NOTE — Progress Notes (Signed)
Pt presents for depo-injection did not have any other concerns, pt had office visit w/[facility] on 07/17/20. Pt states she called just for depo because she didn't want to receive it at health dept.  Depo-administered right ventrogluteal

## 2020-08-30 ENCOUNTER — Ambulatory Visit: Payer: 59

## 2020-08-30 ENCOUNTER — Other Ambulatory Visit (HOSPITAL_COMMUNITY)
Admission: RE | Admit: 2020-08-30 | Discharge: 2020-08-30 | Disposition: A | Discharge status: Home / Self Care | Payer: 59 | Source: Ambulatory Visit | Attending: Internal Medicine | Admitting: Internal Medicine

## 2020-08-30 ENCOUNTER — Other Ambulatory Visit: Payer: Self-pay

## 2020-08-30 DIAGNOSIS — N898 Other specified noninflammatory disorders of vagina: Secondary | ICD-10-CM | POA: Diagnosis present

## 2020-09-02 LAB — CERVICOVAGINAL ANCILLARY ONLY
Bacterial Vaginitis (gardnerella): NEGATIVE
Candida Glabrata: NEGATIVE
Candida Vaginitis: NEGATIVE
Chlamydia: NEGATIVE
Comment: NEGATIVE
Comment: NEGATIVE
Comment: NEGATIVE
Comment: NEGATIVE
Comment: NEGATIVE
Comment: NORMAL
Neisseria Gonorrhea: NEGATIVE
Trichomonas: NEGATIVE

## 2020-09-02 NOTE — Progress Notes (Signed)
Please call patient with update.   Gonorrhea, Chlamydia, Trichomonas, Bacterial Vaginitis, and Candida Vaginitis (yeast infection) negative.

## 2020-09-02 NOTE — Progress Notes (Signed)
Gonorrhea, Chlamydia, Trichomonas, Bacterial Vaginitis, and Candida Vaginitis (yeast infection) negative.

## 2020-09-11 ENCOUNTER — Telehealth: Payer: Self-pay | Admitting: Internal Medicine

## 2020-09-11 NOTE — Telephone Encounter (Signed)
Patient called in asking if she can get a referral to [facility] [address], [place], [place] [address] Phone: [phone] for her headaches.

## 2020-09-27 ENCOUNTER — Ambulatory Visit: Payer: 59 | Admitting: Family

## 2020-09-29 ENCOUNTER — Other Ambulatory Visit: Payer: Self-pay

## 2020-09-29 ENCOUNTER — Emergency Department (HOSPITAL_BASED_OUTPATIENT_CLINIC_OR_DEPARTMENT_OTHER)
Admission: EM | Admit: 2020-09-29 | Discharge: 2020-09-29 | Disposition: A | Discharge status: Home / Self Care | Payer: 59 | Attending: Emergency Medicine | Admitting: Emergency Medicine

## 2020-09-29 ENCOUNTER — Encounter (HOSPITAL_BASED_OUTPATIENT_CLINIC_OR_DEPARTMENT_OTHER): Payer: Self-pay

## 2020-09-29 DIAGNOSIS — K0889 Other specified disorders of teeth and supporting structures: Secondary | ICD-10-CM | POA: Diagnosis present

## 2020-09-29 DIAGNOSIS — K029 Dental caries, unspecified: Secondary | ICD-10-CM | POA: Insufficient documentation

## 2020-09-29 MED ORDER — NAPROXEN 500 MG PO TABS: 500.0000 mg | ORAL_TABLET | Freq: Two times a day (BID) | ORAL | 0 refills | Status: DC

## 2020-09-29 MED ORDER — PENICILLIN V POTASSIUM 250 MG PO TABS
500.0000 mg | ORAL_TABLET | Freq: Once | ORAL | Status: AC
  Administered 2020-09-29: 500 mg via ORAL
  Filled 2020-09-29: qty 2

## 2020-09-29 MED ORDER — NAPROXEN 250 MG PO TABS
500.0000 mg | ORAL_TABLET | Freq: Once | ORAL | Status: AC
  Administered 2020-09-29: 500 mg via ORAL
  Filled 2020-09-29: qty 2

## 2020-09-29 MED ORDER — PENICILLIN V POTASSIUM 500 MG PO TABS: 500.0000 mg | ORAL_TABLET | Freq: Four times a day (QID) | ORAL | 0 refills | Status: AC

## 2020-09-29 NOTE — ED Provider Notes (Signed)
[facility] EMERGENCY DEPARTMENT Provider Note   CSN: [id number] Arrival date & time: 09/29/20  2009     History Chief Complaint  Patient presents with   Dental Pain    Faith Ross is a 30 y.o. female.  30 year old female presents with complaint of right upper dental pain x1 month, progressively worsening.  Denies trauma, fever, drainage.  No other complaints or concerns today.  Denies possibility of pregnancy.      Past Medical History:  Diagnosis Date   Alopecia    Anxiety    Chest wall pain     Patient Active Problem List   Diagnosis Date Noted   Anxiety 12/02/2017   Chest pain 12/02/2017   Palpitations 12/02/2017    Past Surgical History:  Procedure Laterality Date   TONSILLECTOMY       OB History     Gravida  0   Para  0   Term  0   Preterm  0   AB  0   Living  0      SAB  0   IAB  0   Ectopic  0   Multiple  0   Live Births  0           Family History  Problem Relation Age of Onset   Thyroid disease Mother     Social History   Tobacco Use   Smoking status: Never   Smokeless tobacco: Never  Vaping Use   Vaping Use: Never used  Substance Use Topics   Alcohol use: Not Currently   Drug use: Never    Home Medications Prior to Admission medications   Medication Sig Start Date End Date Taking? Authorizing Provider  naproxen (NAPROSYN) 500 MG tablet Take 1 tablet (500 mg total) by mouth 2 (two) times daily. 09/29/20  Yes Jeannie Fend, PA-C  penicillin v potassium (VEETID) 500 MG tablet Take 1 tablet (500 mg total) by mouth 4 (four) times daily for 10 days. 09/29/20 10/09/20 Yes Jeannie Fend, PA-C  cyclobenzaprine (FLEXERIL) 10 MG tablet Take 1 tablet (10 mg total) by mouth 2 (two) times daily as needed for muscle spasms. 09/04/19   Robinson, [place] N, PA-C  erythromycin ophthalmic ointment Place a 1/2 inch ribbon of ointment into the lower eyelid 4 times daily x 1 week 06/25/20   Wieters, Hallie C, PA-C   medroxyPROGESTERone (DEPO-PROVERA) 150 MG/ML injection Inject 150 mg into the muscle every 3 (three) months.    [provider]    Allergies    Patient has no known allergies.  Review of Systems   Review of Systems  Constitutional:  Negative for chills and fever.  HENT:  Positive for dental problem. Negative for facial swelling, trouble swallowing and voice change.   Gastrointestinal:  Negative for vomiting.  Musculoskeletal:  Negative for neck pain and neck stiffness.  Skin:  Negative for rash and wound.  Allergic/Immunologic: Negative for immunocompromised state.  Neurological:  Negative for headaches.  Hematological:  Negative for adenopathy.  Psychiatric/Behavioral:  Negative for confusion.   All other systems reviewed and are negative.  Physical Exam Updated Vital Signs BP 123/84 (BP Location: Left Arm)   Pulse 72   Temp 98.4 F (36.9 C) (Oral)   Resp 18   Ht 5\' 6"  (1.676 m)   Wt 54.4 kg   SpO2 100%   BMI 19.37 kg/m   Physical Exam Vitals and nursing note reviewed.  Constitutional:      General: She  is not in acute distress.    Appearance: She is well-developed. She is not diaphoretic.  HENT:     Head: Normocephalic and atraumatic.     Jaw: No trismus.     Right Ear: Tympanic membrane and ear canal normal.     Nose: Nose normal.     Mouth/Throat:     Mouth: Mucous membranes are moist.     Pharynx: Uvula midline. No oropharyngeal exudate or posterior oropharyngeal erythema.     Comments: Dental caries to right upper teeth without dental abscess noted. Eyes:     Conjunctiva/sclera: Conjunctivae normal.  Pulmonary:     Effort: Pulmonary effort is normal.  Musculoskeletal:     Cervical back: Neck supple.  Lymphadenopathy:     Cervical: No cervical adenopathy.  Skin:    General: Skin is warm and dry.  Neurological:     Mental Status: She is alert and oriented to person, place, and time.  Psychiatric:        Behavior: Behavior normal.    ED  Results / Procedures / Treatments   Labs (all labs ordered are listed, but only abnormal results are displayed) Labs Reviewed - No data to display  EKG None  Radiology No results found.  Procedures Procedures   Medications Ordered in ED Medications  penicillin v potassium (VEETID) tablet 500 mg (has no administration in time range)  naproxen (NAPROSYN) tablet 500 mg (has no administration in time range)    ED Course  I have reviewed the triage vital signs and the nursing notes.  Pertinent labs & imaging results that were available during my care of the patient were reviewed by me and considered in my medical decision making (see chart for details).  Clinical Course as of 09/29/20 2143  Wynelle Link Sep 29, 2020  [id number] 30 year old female with complaint of right upper dental pain as above.  Found to have 2 dental caries to right upper teeth without obvious abscess.  No trismus.  No overlying facial cellulitis.  Plan is to start penicillin, given naproxen for pain.  Patient has a dentist she plans to follow-up with tomorrow. [LM]    Clinical Course User Index [LM] Alden Hipp   MDM Rules/Calculators/A&P                           Final Clinical Impression(s) / ED Diagnoses Final diagnoses:  Dental caries    Rx / DC Orders ED Discharge Orders          Ordered    penicillin v potassium (VEETID) 500 MG tablet  4 times daily        09/29/20 2141    naproxen (NAPROSYN) 500 MG tablet  2 times daily        09/29/20 2141             Jeannie Fend, PA-C 09/29/20 2143    Arby Barrette, MD 10/27/20 1558

## 2020-09-29 NOTE — Discharge Instructions (Addendum)
Follow-up with your dentist as planned tomorrow.  Take antibiotics as prescribed and complete the full course.  Take pain medication as needed as prescribed. Continue to brush teeth with a soft toothbrush.  Rinse with Listerine after every meal.

## 2020-09-29 NOTE — ED Triage Notes (Signed)
Pt arrives with right sided jaw/dental pain X one month reports pain has worsened over last few hours.

## 2020-10-10 ENCOUNTER — Encounter: Payer: Self-pay | Admitting: Family Medicine

## 2020-10-10 ENCOUNTER — Other Ambulatory Visit: Payer: Self-pay

## 2020-10-10 ENCOUNTER — Ambulatory Visit (INDEPENDENT_AMBULATORY_CARE_PROVIDER_SITE_OTHER): Payer: 59 | Admitting: Family Medicine

## 2020-10-10 VITALS — BP 101/71 | HR 83 | Temp 98.3°F | Resp 15 | Ht 65.98 in | Wt 115.2 lb

## 2020-10-10 DIAGNOSIS — F419 Anxiety disorder, unspecified: Secondary | ICD-10-CM | POA: Diagnosis not present

## 2020-10-10 DIAGNOSIS — F32A Depression, unspecified: Secondary | ICD-10-CM | POA: Diagnosis not present

## 2020-10-10 NOTE — Progress Notes (Signed)
Pt presents for depression request referral to therapist

## 2020-10-10 NOTE — Progress Notes (Signed)
Established Patient Office Visit  Subjective:  Patient ID: Faith Ross, female    DOB: [birth year]  Age: 30 y.o. MRN: [id number]  CC:  Chief Complaint  Patient presents with   Depression    HPI Faith Ross presents for referral to behavioral health.  Patient reports that she is beginning to have difficulty in her relationship secondary to her mood.  People have told her that she has been angry. Patient reports that she has deferred any attempts to medicate her in the past.   Past Medical History:  Diagnosis Date   Alopecia    Anxiety    Chest wall pain     Past Surgical History:  Procedure Laterality Date   TONSILLECTOMY      Family History  Problem Relation Age of Onset   Thyroid disease Mother     Social History   Socioeconomic History   Marital status: Single    Spouse name: Not on file   Number of children: Not on file   Years of education: Not on file   Highest education level: Not on file  Occupational History   Not on file  Tobacco Use   Smoking status: Never   Smokeless tobacco: Never  Vaping Use   Vaping Use: Never used  Substance and Sexual Activity   Alcohol use: Not Currently   Drug use: Never   Sexual activity: Yes    Birth control/protection: Injection  Other Topics Concern   Not on file  Social History Narrative   Not on file   Social Determinants of Health   Financial Resource Strain: Not on file  Food Insecurity: Not on file  Transportation Needs: Not on file  Physical Activity: Not on file  Stress: Not on file  Social Connections: Not on file  Intimate Partner Violence: Not on file    Outpatient Medications Prior to Visit  Medication Sig Dispense Refill   medroxyPROGESTERone (DEPO-PROVERA) 150 MG/ML injection Inject 150 mg into the muscle every 3 (three) months.     naproxen (NAPROSYN) 500 MG tablet Take 1 tablet (500 mg total) by mouth 2 (two) times daily. 30 tablet 0   penicillin v potassium (VEETID) 500 MG tablet Take  500 mg by mouth 4 (four) times daily.     Tofacitinib Citrate (XELJANZ) 5 MG TABS 1 tablet     cyclobenzaprine (FLEXERIL) 10 MG tablet Take 1 tablet (10 mg total) by mouth 2 (two) times daily as needed for muscle spasms. 14 tablet 0   erythromycin ophthalmic ointment Place a 1/2 inch ribbon of ointment into the lower eyelid 4 times daily x 1 week 3.5 g 0   No facility-administered medications prior to visit.    No Known Allergies  ROS Review of Systems  Psychiatric/Behavioral:  Positive for agitation. Negative for self-injury, sleep disturbance and suicidal ideas. The patient is nervous/anxious.   All other systems reviewed and are negative.    Objective:    Physical Exam Vitals and nursing note reviewed.  Constitutional:      General: She is not in acute distress. Cardiovascular:     Rate and Rhythm: Normal rate and regular rhythm.  Pulmonary:     Effort: Pulmonary effort is normal.     Breath sounds: Normal breath sounds.  Neurological:     General: No focal deficit present.     Mental Status: She is alert and oriented to person, place, and time.  Psychiatric:        Mood and Affect: Mood  is anxious.        Speech: Speech normal.        Behavior: Behavior is cooperative.    BP 101/71 (BP Location: Left Arm, Patient Position: Sitting, Cuff Size: Small)   Pulse 83   Temp 98.3 F (36.8 C)   Resp 15   Ht 5' 5.98" (1.676 m)   Wt 115 lb 3.2 oz (52.3 kg)   SpO2 99%   BMI 18.60 kg/m  Wt Readings from Last 3 Encounters:  10/10/20 115 lb 3.2 oz (52.3 kg)  09/29/20 120 lb (54.4 kg)  08/02/20 123 lb (55.8 kg)     Health Maintenance Due  Topic Date Due   COVID-19 Vaccine (1) Never done   HIV Screening  Never done   Hepatitis C Screening  Never done   TETANUS/TDAP  Never done   INFLUENZA VACCINE  09/23/2020           Assessment & Plan:  1. Anxiety and depression Referral made to behavioral health for counseling.  Patient deferred any medications at this  time.  - Ambulatory referral to Psychology   Follow-up: No follow-ups on file.    Tommie Raymond, MD

## 2020-10-24 ENCOUNTER — Other Ambulatory Visit: Payer: Self-pay

## 2020-10-25 ENCOUNTER — Ambulatory Visit (INDEPENDENT_AMBULATORY_CARE_PROVIDER_SITE_OTHER): Payer: 59

## 2020-10-25 ENCOUNTER — Other Ambulatory Visit: Payer: Self-pay

## 2020-10-25 DIAGNOSIS — Z3042 Encounter for surveillance of injectable contraceptive: Secondary | ICD-10-CM

## 2020-10-25 MED ORDER — MEDROXYPROGESTERONE ACETATE 150 MG/ML IM SUSP
150.0000 mg | Freq: Once | INTRAMUSCULAR | Status: AC
  Administered 2020-10-25: 150 mg via INTRAMUSCULAR

## 2020-10-25 NOTE — Progress Notes (Signed)
Depo-provera administered in right ventrogluteal pt tolerated injection well

## 2020-11-04 ENCOUNTER — Ambulatory Visit: Payer: 59

## 2020-12-05 ENCOUNTER — Encounter: Payer: Self-pay | Admitting: Family Medicine

## 2020-12-05 ENCOUNTER — Other Ambulatory Visit: Payer: Self-pay

## 2020-12-05 ENCOUNTER — Other Ambulatory Visit (HOSPITAL_COMMUNITY)
Admission: RE | Admit: 2020-12-05 | Discharge: 2020-12-05 | Disposition: A | Discharge status: Home / Self Care | Payer: 59 | Source: Ambulatory Visit | Attending: Family Medicine | Admitting: Family Medicine

## 2020-12-05 ENCOUNTER — Ambulatory Visit (INDEPENDENT_AMBULATORY_CARE_PROVIDER_SITE_OTHER): Payer: 59 | Admitting: Family Medicine

## 2020-12-05 VITALS — BP 120/81 | HR 97 | Resp 16 | Wt 118.0 lb

## 2020-12-05 DIAGNOSIS — N914 Secondary oligomenorrhea: Secondary | ICD-10-CM

## 2020-12-05 DIAGNOSIS — N760 Acute vaginitis: Secondary | ICD-10-CM | POA: Insufficient documentation

## 2020-12-05 MED ORDER — FLUCONAZOLE 150 MG PO TABS: 150.0000 mg | ORAL_TABLET | Freq: Once | ORAL | 0 refills | Status: AC

## 2020-12-05 NOTE — Progress Notes (Signed)
   Established  Patient Office Visit  Subjective:  Patient ID: Faith Ross, female    DOB: 05-Jun-1990  Age: 30 y.o. MRN: [id number]  CC:  Chief Complaint  Patient presents with   Abdominal Pain    HPI Laurence Folz presents for complaint of intermittent irregular bleeding as well as intermittent pelvic pain and vaginal discharge.  Past Medical History:  Diagnosis Date   Alopecia    Anxiety    Chest wall pain       Social History   Socioeconomic History   Marital status: Single    Spouse name: Not on file   Number of children: Not on file   Years of education: Not on file   Highest education level: Not on file  Occupational History   Not on file  Tobacco Use   Smoking status: Never   Smokeless tobacco: Never  Vaping Use   Vaping Use: Never used  Substance and Sexual Activity   Alcohol use: Not Currently   Drug use: Never   Sexual activity: Yes    Birth control/protection: Injection  Other Topics Concern   Not on file  Social History Narrative   Not on file   Social Determinants of Health   Financial Resource Strain: Not on file  Food Insecurity: Not on file  Transportation Needs: Not on file  Physical Activity: Not on file  Stress: Not on file  Social Connections: Not on file  Intimate Partner Violence: Not on file    ROS Review of Systems  Genitourinary:  Positive for menstrual problem, pelvic pain, vaginal bleeding and vaginal discharge.  All other systems reviewed and are negative.  Objective:   Today's Vitals: BP 120/81   Pulse 97   Resp 16   Wt 118 lb (53.5 kg)   SpO2 98%   BMI 19.05 kg/m   Physical Exam Vitals and nursing note reviewed.  Constitutional:      General: She is not in acute distress. Cardiovascular:     Rate and Rhythm: Normal rate and regular rhythm.  Pulmonary:     Effort: Pulmonary effort is normal.     Breath sounds: Normal breath sounds.  Abdominal:     Palpations: Abdomen is soft.     Tenderness: There  is no abdominal tenderness.  Neurological:     General: No focal deficit present.     Mental Status: She is alert and oriented to person, place, and time.    Assessment & Plan:   1. Vaginitis and vulvovaginitis Cultures obtained.  Patient prescribed Diflucan for itchy vaginal symptoms possibly secondary to recent antibiotic use. - Cervicovaginal ancillary only - HIV antibody (with reflex)  2. Secondary oligomenorrhea Most likely secondary to recent Depo injection.  Cultures as above. we will monitor.  Outpatient Encounter Medications as of 12/05/2020  Medication Sig   fluconazole (DIFLUCAN) 150 MG tablet Take 1 tablet (150 mg total) by mouth once for 1 dose.   naproxen (NAPROSYN) 500 MG tablet Take 1 tablet (500 mg total) by mouth 2 (two) times daily.   medroxyPROGESTERone (DEPO-PROVERA) 150 MG/ML injection See admin instructions.   penicillin v potassium (VEETID) 500 MG tablet Take 500 mg by mouth 4 (four) times daily. (Patient not taking: Reported on 12/05/2020)   No facility-administered encounter medications on file as of 12/05/2020.    Follow-up: No follow-ups on file.   Tommie Raymond, MD

## 2020-12-05 NOTE — Progress Notes (Signed)
Patient c/o abdominal pain and bleeding x1 month. Patient has taken OTC medication and there is no relief. Patient receive depo shot last month but pain has been going on prior to that

## 2020-12-06 LAB — CERVICOVAGINAL ANCILLARY ONLY
Bacterial Vaginitis (gardnerella): NEGATIVE
Candida Glabrata: NEGATIVE
Candida Vaginitis: NEGATIVE
Chlamydia: NEGATIVE
Comment: NEGATIVE
Comment: NEGATIVE
Comment: NEGATIVE
Comment: NEGATIVE
Comment: NEGATIVE
Comment: NORMAL
Neisseria Gonorrhea: NEGATIVE
Trichomonas: NEGATIVE

## 2020-12-27 ENCOUNTER — Emergency Department (HOSPITAL_BASED_OUTPATIENT_CLINIC_OR_DEPARTMENT_OTHER)
Admission: EM | Admit: 2020-12-27 | Discharge: 2020-12-27 | Disposition: A | Discharge status: Home / Self Care | Payer: 59 | Attending: Emergency Medicine | Admitting: Emergency Medicine

## 2020-12-27 ENCOUNTER — Other Ambulatory Visit: Payer: Self-pay

## 2020-12-27 ENCOUNTER — Encounter (HOSPITAL_BASED_OUTPATIENT_CLINIC_OR_DEPARTMENT_OTHER): Payer: Self-pay | Admitting: *Deleted

## 2020-12-27 DIAGNOSIS — B9689 Other specified bacterial agents as the cause of diseases classified elsewhere: Secondary | ICD-10-CM

## 2020-12-27 DIAGNOSIS — N898 Other specified noninflammatory disorders of vagina: Secondary | ICD-10-CM | POA: Diagnosis present

## 2020-12-27 LAB — URINALYSIS, MICROSCOPIC (REFLEX): RBC / HPF: NONE SEEN RBC/hpf (ref 0–5)

## 2020-12-27 LAB — URINALYSIS, ROUTINE W REFLEX MICROSCOPIC
Bilirubin Urine: NEGATIVE
Glucose, UA: NEGATIVE mg/dL
Hgb urine dipstick: NEGATIVE
Ketones, ur: NEGATIVE mg/dL
Nitrite: NEGATIVE
Protein, ur: NEGATIVE mg/dL
Specific Gravity, Urine: 1.02 (ref 1.005–1.030)
pH: 7 (ref 5.0–8.0)

## 2020-12-27 LAB — WET PREP, GENITAL
Sperm: NONE SEEN
Trich, Wet Prep: NONE SEEN
Yeast Wet Prep HPF POC: NONE SEEN

## 2020-12-27 LAB — PREGNANCY, URINE: Preg Test, Ur: NEGATIVE

## 2020-12-27 MED ORDER — METRONIDAZOLE 500 MG PO TABS: 500.0000 mg | ORAL_TABLET | Freq: Two times a day (BID) | ORAL | 0 refills | Status: DC

## 2020-12-27 MED ORDER — METRONIDAZOLE 500 MG PO TABS
500.0000 mg | ORAL_TABLET | Freq: Once | ORAL | Status: AC
  Administered 2020-12-27: 500 mg via ORAL
  Filled 2020-12-27: qty 1

## 2020-12-27 NOTE — ED Provider Notes (Signed)
[facility] EMERGENCY DEPARTMENT Provider Note   CSN: [id number] Arrival date & time: 12/27/20  2123     History Chief Complaint  Patient presents with   Vaginal Discharge    Faith Ross is a 30 y.o. female.  The history is provided by the patient.  Vaginal Itching This is a new problem. The current episode started more than 1 week ago. The problem occurs constantly. The problem has not changed since onset.Pertinent negatives include no chest pain, no abdominal pain, no headaches and no shortness of breath. Nothing aggravates the symptoms. Nothing relieves the symptoms. She has tried nothing for the symptoms. The treatment provided no relief.  No discharge, was treated     Past Medical History:  Diagnosis Date   Alopecia    Anxiety    Chest wall pain     Patient Active Problem List   Diagnosis Date Noted   Anxiety 12/02/2017   Chest pain 12/02/2017   Palpitations 12/02/2017    Past Surgical History:  Procedure Laterality Date   TONSILLECTOMY       OB History     Gravida  0   Para  0   Term  0   Preterm  0   AB  0   Living  0      SAB  0   IAB  0   Ectopic  0   Multiple  0   Live Births  0           Family History  Problem Relation Age of Onset   Thyroid disease Mother     Social History   Tobacco Use   Smoking status: Never   Smokeless tobacco: Never  Vaping Use   Vaping Use: Never used  Substance Use Topics   Alcohol use: Not Currently   Drug use: Never    Home Medications Prior to Admission medications   Medication Sig Start Date End Date Taking? Authorizing Provider  medroxyPROGESTERone (DEPO-PROVERA) 150 MG/ML injection See admin instructions.    [provider]  naproxen (NAPROSYN) 500 MG tablet Take 1 tablet (500 mg total) by mouth 2 (two) times daily. 09/29/20   Jeannie Fend, PA-C  penicillin v potassium (VEETID) 500 MG tablet Take 500 mg by mouth 4 (four) times daily. Patient not taking:  Reported on 12/05/2020    [provider]    Allergies    Patient has no known allergies.  Review of Systems   Review of Systems  Constitutional:  Negative for fever.  HENT:  Negative for congestion.   Eyes:  Negative for redness.  Respiratory:  Negative for shortness of breath.   Cardiovascular:  Negative for chest pain.  Gastrointestinal:  Negative for abdominal pain.  Genitourinary:  Negative for difficulty urinating, dysuria and vaginal discharge.  Musculoskeletal:  Negative for neck pain and neck stiffness.  Skin:  Negative for rash.  Neurological:  Negative for headaches.  Psychiatric/Behavioral:  Negative for agitation.   All other systems reviewed and are negative.  Physical Exam Updated Vital Signs BP 117/76 (BP Location: Right Arm)   Pulse 72   Temp 98.6 F (37 C) (Oral)   Resp 18   Ht 5\' 6"  (1.676 m)   Wt 54.4 kg   SpO2 100%   BMI 19.37 kg/m   Physical Exam Vitals and nursing note reviewed.  Constitutional:      General: She is not in acute distress.    Appearance: Normal appearance.  HENT:  Head: Normocephalic and atraumatic.     Nose: Nose normal.  Eyes:     Conjunctiva/sclera: Conjunctivae normal.     Pupils: Pupils are equal, round, and reactive to light.  Cardiovascular:     Rate and Rhythm: Normal rate and regular rhythm.     Pulses: Normal pulses.     Heart sounds: Normal heart sounds.  Pulmonary:     Effort: Pulmonary effort is normal.     Breath sounds: Normal breath sounds.  Abdominal:     General: Abdomen is flat. Bowel sounds are normal.     Palpations: Abdomen is soft.     Tenderness: There is no abdominal tenderness. There is no guarding.  Musculoskeletal:        General: Normal range of motion.     Cervical back: Normal range of motion and neck supple. No rigidity.  Skin:    General: Skin is warm and dry.     Capillary Refill: Capillary refill takes less than 2 seconds.  Neurological:     General: No focal deficit  present.     Mental Status: She is alert and oriented to person, place, and time.     Deep Tendon Reflexes: Reflexes normal.  Psychiatric:        Mood and Affect: Mood normal.        Behavior: Behavior normal.    ED Results / Procedures / Treatments   Labs (all labs ordered are listed, but only abnormal results are displayed) Results for orders placed or performed during the hospital encounter of 12/27/20  Wet prep, genital   Specimen: Cervix  Result Value Ref Range   Yeast Wet Prep HPF POC NONE SEEN NONE SEEN   Trich, Wet Prep NONE SEEN NONE SEEN   Clue Cells Wet Prep HPF POC PRESENT (A) NONE SEEN   WBC, Wet Prep HPF POC MODERATE (A) NONE SEEN   Sperm NONE SEEN   Pregnancy, urine  Result Value Ref Range   Preg Test, Ur NEGATIVE NEGATIVE  Urinalysis, Routine w reflex microscopic Urine, Clean Catch  Result Value Ref Range   Color, Urine YELLOW YELLOW   APPearance CLEAR CLEAR   Specific Gravity, Urine 1.020 1.005 - 1.030   pH 7.0 5.0 - 8.0   Glucose, UA NEGATIVE NEGATIVE mg/dL   Hgb urine dipstick NEGATIVE NEGATIVE   Bilirubin Urine NEGATIVE NEGATIVE   Ketones, ur NEGATIVE NEGATIVE mg/dL   Protein, ur NEGATIVE NEGATIVE mg/dL   Nitrite NEGATIVE NEGATIVE   Leukocytes,Ua SMALL (A) NEGATIVE  Urinalysis, Microscopic (reflex)  Result Value Ref Range   RBC / HPF NONE SEEN 0 - 5 RBC/hpf   WBC, UA 6-10 0 - 5 WBC/hpf   Bacteria, UA RARE (A) NONE SEEN   Squamous Epithelial / LPF 0-5 0 - 5   No results found.   Radiology No results found.  Procedures Procedures   Medications Ordered in ED Medications  metroNIDAZOLE (FLAGYL) tablet 500 mg (has no administration in time range)    ED Course  I have reviewed the triage vital signs and the nursing notes.  Pertinent labs & imaging results that were available during my care of the patient were reviewed by me and considered in my medical decision making (see chart for details).   Treated for BV and RX given.  GC and Chlamydia  will return in 3 days.    Faith Ross was evaluated in Emergency Department on 12/27/2020 for the symptoms described in the history of present illness. She was  evaluated in the context of the global COVID-19 pandemic, which necessitated consideration that the patient might be at risk for infection with the SARS-CoV-2 virus that causes COVID-19. Institutional protocols and algorithms that pertain to the evaluation of patients at risk for COVID-19 are in a state of rapid change based on information released by regulatory bodies including the CDC and federal and state organizations. These policies and algorithms were followed during the patient's care in the ED.  Final Clinical Impression(s) / ED Diagnoses Final diagnoses:  None   Return for intractable cough, coughing up blood, fevers > 100.4 unrelieved by medication, shortness of breath, intractable vomiting, chest pain, shortness of breath, weakness, numbness, changes in speech, facial asymmetry, abdominal pain, passing out, Inability to tolerate liquids or food, cough, altered mental status or any concerns. No signs of systemic illness or infection. The patient is nontoxic-appearing on exam and vital signs are within normal limits.  I have reviewed the triage vital signs and the nursing notes. Pertinent labs & imaging results that were available during my care of the patient were reviewed by me and considered in my medical decision making (see chart for details). After history, exam, and medical workup I feel the patient has been appropriately medically screened and is safe for discharge home. Pertinent diagnoses were discussed with the patient. Patient was given return precautions.     Rx / DC Orders ED Discharge Orders     None        Brizeida Mcmurry, MD 12/27/20 2338

## 2020-12-27 NOTE — ED Triage Notes (Signed)
C/o vaginal discharge x 3 weeks

## 2020-12-27 NOTE — ED Notes (Signed)
Patient verbalizes understanding of discharge instructions. Opportunity for questioning and answers were provided. Armband removed by staff, pt discharged from ED. Ambulated out to lobby  

## 2020-12-29 ENCOUNTER — Other Ambulatory Visit: Payer: Self-pay

## 2020-12-29 ENCOUNTER — Emergency Department (HOSPITAL_BASED_OUTPATIENT_CLINIC_OR_DEPARTMENT_OTHER): Admission: EM | Admit: 2020-12-29 | Discharge: 2020-12-29 | Discharge status: Against Medical Advice | Payer: 59

## 2020-12-30 LAB — GC/CHLAMYDIA PROBE AMP (~~LOC~~) NOT AT ARMC
Chlamydia: NEGATIVE
Comment: NEGATIVE
Comment: NORMAL
Neisseria Gonorrhea: NEGATIVE

## 2020-12-31 ENCOUNTER — Encounter: Payer: Self-pay | Admitting: Nurse Practitioner

## 2020-12-31 ENCOUNTER — Other Ambulatory Visit: Payer: Self-pay

## 2020-12-31 ENCOUNTER — Ambulatory Visit (INDEPENDENT_AMBULATORY_CARE_PROVIDER_SITE_OTHER): Payer: 59 | Admitting: Nurse Practitioner

## 2020-12-31 DIAGNOSIS — N760 Acute vaginitis: Secondary | ICD-10-CM | POA: Diagnosis not present

## 2020-12-31 DIAGNOSIS — B9689 Other specified bacterial agents as the cause of diseases classified elsewhere: Secondary | ICD-10-CM | POA: Diagnosis not present

## 2020-12-31 MED ORDER — METRONIDAZOLE 0.75 % VA GEL: 1.0000 | Freq: Every day | VAGINAL | 0 refills | Status: AC

## 2020-12-31 NOTE — Patient Instructions (Addendum)
Bacterial Vaginitis:  Unable to tolerate oral Flagyl as prescribed by ED  Will order vaginal gel  Follow up:  Follow up with PCP in 2 weeks for contraception management  Contraception Choices Contraception refers to things you do or use to prevent pregnancy. It is also called birth control. There are several methods of birth control. Talk to your doctor about the best method for you. Hormonal birth control This kind of birth control uses hormones. Here are some types of hormonal birth control: A tube that is put under the skin of your arm (implant). The tube can stay in for up to 3 years. Shots you get every 3 months. Pills you take every day. A patch you change 1 time each week for 3 weeks. After that, the patch is taken off for 1 week. A ring you put in the vagina. The ring is left in for 3 weeks. Then it is taken out of the vagina for 1 week. Then a new ring is put in. Pills you take after unprotected sex. These are called emergency birth control pills. Barrier birth control Here are some types of barrier birth control: A thin covering that is put on the penis before sex (female condom). The covering is thrown away after sex. A soft, loose covering that is put in the vagina before sex (female condom). The covering is thrown away after sex. A rubber bowl that sits over the cervix (diaphragm). The bowl must be made for you. The bowl is put into the vagina before sex. The bowl is left in for 6-8 hours after sex. It is taken out within 24 hours. A small, soft cup that fits over the cervix (cervical cap). The cup must be made for you. The cup should be left in for 6-8 hours after sex. It is taken out within 48 hours. A sponge that is put into the vagina before sex. It must be left in for at least 6 hours after sex. It must be taken out within 30 hours and thrown away. A chemical that kills or stops sperm from getting into the womb (uterus). This chemical is called a spermicide. It may be a  pill, cream, jelly, or foam to put in the vagina. The chemical should be used at least 10-15 minutes before sex. IUD birth control IUD means "intrauterine device." It is put inside the womb. There are two kinds: Hormone IUD. This kind can stay in the womb for 3-5 years. Copper IUD. This kind can stay in the womb for 10 years. Permanent birth control Here are some types of permanent birth control: Surgery to block the fallopian tubes. Having an insert put into each fallopian tube. This method takes 3 months to work. Other forms of birth control must be used for 3 months. Surgery to tie off the tubes that carry sperm in men (vasectomy). This method takes 3 months to work. Other forms of birth control must be used for 3 months. Natural planning birth control Here are some types of natural planning birth control: Not having sex on the days the woman could get pregnant. Using a calendar: To keep track of the length of each menstrual cycle. To find out what days pregnancy can happen. To plan to not have sex on days when pregnancy can happen. Watching for signs of ovulation and not having sex during this time. One way the woman can check for ovulation is to check her temperature. Waiting to have sex until after ovulation. Where to find  more information Centers for Disease Control and Prevention: FootballExhibition.com.br Summary Contraception, also called birth control, refers to things you do or use to prevent pregnancy. Hormonal methods of birth control include implants, injections, pills, patches, vaginal rings, and emergency birth control pills. Barrier methods of birth control can include female condoms, female condoms, diaphragms, cervical caps, sponges, and spermicides. There are two types of IUD (intrauterine device) birth control. An IUD can be put in a woman's womb to prevent pregnancy for several years. Permanent birth control can be done through a procedure for males, females, or both. Natural  planning means not having sex when the woman could get pregnant. This information is not intended to replace advice given to you by your health care provider. Make sure you discuss any questions you have with your health care provider. Document Revised: 07/17/2019 Document Reviewed: 07/17/2019 Elsevier Patient Education  2022 [employer] Inc.   Bacterial Vaginosis Bacterial vaginosis is an infection of the vagina. It happens when too many normal germs (healthy bacteria) grow in the vagina. This infection can make it easier to get other infections from sex (STIs). It is very important for pregnant women to get treated. This infection can cause babies to be born early or at a low birth weight. What are the causes? This infection is caused by an increase in certain germs that grow in the vagina. You cannot get this infection from toilet seats, bedsheets, swimming pools, or things that touch your vagina. What increases the risk? Having sex with a new person or more than one person. Having sex without protection. Douching. Having an intrauterine device (IUD). Smoking. Using drugs or drinking alcohol. These can lead you to do things that are risky. Taking certain antibiotic medicines. Being pregnant. What are the signs or symptoms? Some women have no symptoms. Symptoms may include: A discharge from your vagina. It may be gray or white. It can be watery or foamy. A fishy smell. This can happen after sex or during your menstrual period. Itching in and around your vagina. A feeling of burning or pain when you pee (urinate). How is this treated? This infection is treated with antibiotic medicines. These may be given to you as: A pill. A cream for your vagina. A medicine that you put into your vagina (suppository). If the infection comes back after treatment, you may need more antibiotics. Follow these instructions at home: Medicines Take over-the-counter and prescription medicines as told by  your doctor. Take or use your antibiotic medicine as told by your doctor. Do not stop taking or using it, even if you start to feel better. General instructions If the person you have sex with is a woman, tell her that you have this infection. She will need to follow up with her doctor. If you have a female partner, he does not need to be treated. Do not have sex until you finish treatment. Drink enough fluid to keep your pee pale yellow. Keep your vagina and butt clean. Wash the area with warm water each day. Wipe from front to back after you use the toilet. If you are breastfeeding a baby, ask your doctor if you should keep doing so during treatment. Keep all follow-up visits. How is this prevented? Self-care Do not douche. Use only warm water to wash around your vagina. Wear underwear that is cotton or lined with cotton. Do not wear tight pants and pantyhose, especially in the summer. Safe sex Use protection when you have sex. This includes: Use condoms. Use  dental dams. This is a thin layer that protects the mouth during oral sex. Limit how many people you have sex with. To prevent this infection, it is best to have sex with just one person. Get tested for STIs. The person you have sex with should also get tested. Drugs and alcohol Do not smoke or use any products that contain nicotine or tobacco. If you need help quitting, ask your doctor. Do not use drugs. Do not drink alcohol if: Your doctor tells you not to drink. You are pregnant, may be pregnant, or are planning to become pregnant. If you drink alcohol: Limit how much you have to 0-1 drink a day. Know how much alcohol is in your drink. In the [place] one drink equals one 12 oz bottle of beer (355 mL), one 5 oz glass of wine (148 mL), or one 1 oz glass of hard liquor (44 mL). Where to find more information Centers for Disease Control and Prevention: FootballExhibition.com.br American Sexual Health Association: www.ashastd.org Office on  [employer] Health: http://hoffman.com/ Contact a doctor if: Your symptoms do not get better, even after you are treated. You have more discharge or pain when you pee. You have a fever or chills. You have pain in your belly (abdomen) or in the area between your hips. You have pain with sex. You bleed from your vagina between menstrual periods. Summary This infection can happen when too many germs (bacteria) grow in the vagina. This infection can make it easier to get infections from sex (STIs). Treating this can lower that chance. Get treated if you are pregnant. This infection can cause babies to be born early. Do not stop taking or using your antibiotic medicine, even if you start to feel better. This information is not intended to replace advice given to you by your health care provider. Make sure you discuss any questions you have with your health care provider. Document Revised: 08/10/2019 Document Reviewed: 08/10/2019 Elsevier Patient Education  2022 [employer].

## 2020-12-31 NOTE — Assessment & Plan Note (Signed)
Unable to tolerate oral Flagyl as prescribed by ED  Will order vaginal gel  Follow up:  Follow up with PCP in 2 weeks for contraception management

## 2020-12-31 NOTE — Progress Notes (Signed)
 @Patient  ID: Faith Ross, female    DOB: [birth year], 30 y.o.   MRN: [id number]  Chief Complaint  Patient presents with   Vaginitis    Referring provider: Georganna SkeansWilson, Amelia, MD   HPI  Patient presents today for ED follow-up.  Patient was seen in the ED on 12/27/2020 and was diagnosed with bacterial vaginosis she was prescribed Flagyl.  She states that she is not able to tolerate this medication due to nausea.  She is requesting that metronidazole gel be sent to the pharmacy.  Patient also states that she may want to change birth control method.  Her next Depo shot is due in December.  We discussed that we can have her come back around this time to meet with Dr. Andrey CampanileWilson to discuss new birth control methods.  Handout given with after visit summary today.  Denies f/c/s, n/v/d, hemoptysis, PND, chest pain or edema.     No Known Allergies   There is no immunization history on file for this patient.  Past Medical History:  Diagnosis Date   Alopecia    Anxiety    Chest wall pain     Tobacco History: Social History   Tobacco Use  Smoking Status Never  Smokeless Tobacco Never   Counseling given: Not Answered   Outpatient Encounter Medications as of 12/31/2020  Medication Sig   metroNIDAZOLE (METROGEL) 0.75 % vaginal gel Place 1 Applicatorful vaginally at bedtime for 5 days.   medroxyPROGESTERone (DEPO-PROVERA) 150 MG/ML injection See admin instructions.   naproxen (NAPROSYN) 500 MG tablet Take 1 tablet (500 mg total) by mouth 2 (two) times daily.   penicillin v potassium (VEETID) 500 MG tablet Take 500 mg by mouth 4 (four) times daily. (Patient not taking: Reported on 12/05/2020)   [DISCONTINUED] metroNIDAZOLE (FLAGYL) 500 MG tablet Take 1 tablet (500 mg total) by mouth 2 (two) times daily.   No facility-administered encounter medications on file as of 12/31/2020.     Review of Systems  Review of Systems  Constitutional: Negative.   HENT: Negative.    Cardiovascular:  Negative.   Gastrointestinal: Negative.   Allergic/Immunologic: Negative.   Neurological: Negative.   Psychiatric/Behavioral: Negative.        Physical Exam  There were no vitals taken for this visit.  Wt Readings from Last 5 Encounters:  12/27/20 120 lb (54.4 kg)  12/05/20 118 lb (53.5 kg)  10/10/20 115 lb 3.2 oz (52.3 kg)  09/29/20 120 lb (54.4 kg)  08/02/20 123 lb (55.8 kg)     Physical Exam Vitals and nursing note reviewed.  Constitutional:      General: She is not in acute distress.    Appearance: She is well-developed.  Cardiovascular:     Rate and Rhythm: Normal rate and regular rhythm.  Pulmonary:     Effort: Pulmonary effort is normal.     Breath sounds: Normal breath sounds.  Neurological:     Mental Status: She is alert and oriented to person, place, and time.     Lab Results:  CBC    Component Value Date/Time   WBC 2.5 (L) 07/17/2020 1421   WBC 2.6 (L) 06/14/2020 0628   RBC 3.71 (L) 07/17/2020 1421   HGB 11.3 (L) 07/17/2020 1421   HGB 11.4 06/16/2020 1544   HCT 32.2 (L) 07/17/2020 1421   HCT 34.2 06/16/2020 1544   PLT 225 07/17/2020 1421   PLT 229 06/16/2020 1544   MCV 86.8 07/17/2020 1421   MCV 91 06/16/2020 1544  MCH 30.5 07/17/2020 1421   MCHC 35.1 07/17/2020 1421   RDW 12.2 07/17/2020 1421   RDW 12.2 06/16/2020 1544   LYMPHSABS 1.4 07/17/2020 1421   LYMPHSABS 1.2 06/16/2020 1544   MONOABS 0.2 07/17/2020 1421   EOSABS 0.1 07/17/2020 1421   EOSABS 0.0 06/16/2020 1544   BASOSABS 0.0 07/17/2020 1421   BASOSABS 0.0 06/16/2020 1544    BMET    Component Value Date/Time   NA 138 07/17/2020 1421   NA 138 06/16/2020 1544   K 3.4 (L) 07/17/2020 1421   CL 105 07/17/2020 1421   CO2 26 07/17/2020 1421   GLUCOSE 101 (H) 07/17/2020 1421   BUN 9 07/17/2020 1421   BUN 6 06/16/2020 1544   CREATININE 0.87 07/17/2020 1421   CALCIUM 9.9 07/17/2020 1421   GFRNONAA >60 07/17/2020 1421   GFRAA 114 06/05/2019 1630    BNP No results found for:  BNP  ProBNP No results found for: PROBNP  Imaging: No results found.   Assessment & Plan:   Bacterial vaginitis Unable to tolerate oral Flagyl as prescribed by ED  Will order vaginal gel  Follow up:  Follow up with PCP in 2 weeks for contraception management  Patient Instructions  Bacterial Vaginitis:  Unable to tolerate oral Flagyl as prescribed by ED  Will order vaginal gel  Follow up:  Follow up with PCP in 2 weeks for contraception management  Contraception Choices Contraception refers to things you do or use to prevent pregnancy. It is also called birth control. There are several methods of birth control. Talk to your doctor about the best method for you. Hormonal birth control This kind of birth control uses hormones. Here are some types of hormonal birth control: A tube that is put under the skin of your arm (implant). The tube can stay in for up to 3 years. Shots you get every 3 months. Pills you take every day. A patch you change 1 time each week for 3 weeks. After that, the patch is taken off for 1 week. A ring you put in the vagina. The ring is left in for 3 weeks. Then it is taken out of the vagina for 1 week. Then a new ring is put in. Pills you take after unprotected sex. These are called emergency birth control pills. Barrier birth control Here are some types of barrier birth control: A thin covering that is put on the penis before sex (female condom). The covering is thrown away after sex. A soft, loose covering that is put in the vagina before sex (female condom). The covering is thrown away after sex. A rubber bowl that sits over the cervix (diaphragm). The bowl must be made for you. The bowl is put into the vagina before sex. The bowl is left in for 6-8 hours after sex. It is taken out within 24 hours. A small, soft cup that fits over the cervix (cervical cap). The cup must be made for you. The cup should be left in for 6-8 hours after sex. It is taken  out within 48 hours. A sponge that is put into the vagina before sex. It must be left in for at least 6 hours after sex. It must be taken out within 30 hours and thrown away. A chemical that kills or stops sperm from getting into the womb (uterus). This chemical is called a spermicide. It may be a pill, cream, jelly, or foam to put in the vagina. The chemical should be used at  least 10-15 minutes before sex. IUD birth control IUD means "intrauterine device." It is put inside the womb. There are two kinds: Hormone IUD. This kind can stay in the womb for 3-5 years. Copper IUD. This kind can stay in the womb for 10 years. Permanent birth control Here are some types of permanent birth control: Surgery to block the fallopian tubes. Having an insert put into each fallopian tube. This method takes 3 months to work. Other forms of birth control must be used for 3 months. Surgery to tie off the tubes that carry sperm in men (vasectomy). This method takes 3 months to work. Other forms of birth control must be used for 3 months. Natural planning birth control Here are some types of natural planning birth control: Not having sex on the days the woman could get pregnant. Using a calendar: To keep track of the length of each menstrual cycle. To find out what days pregnancy can happen. To plan to not have sex on days when pregnancy can happen. Watching for signs of ovulation and not having sex during this time. One way the woman can check for ovulation is to check her temperature. Waiting to have sex until after ovulation. Where to find more information Centers for Disease Control and Prevention: http://www.wolf.info/ Summary Contraception, also called birth control, refers to things you do or use to prevent pregnancy. Hormonal methods of birth control include implants, injections, pills, patches, vaginal rings, and emergency birth control pills. Barrier methods of birth control can include female condoms, female  condoms, diaphragms, cervical caps, sponges, and spermicides. There are two types of IUD (intrauterine device) birth control. An IUD can be put in a woman's womb to prevent pregnancy for several years. Permanent birth control can be done through a procedure for males, females, or both. Natural planning means not having sex when the woman could get pregnant. This information is not intended to replace advice given to you by your health care provider. Make sure you discuss any questions you have with your health care provider. Document Revised: 07/17/2019 Document Reviewed: 07/17/2019 Elsevier Patient Education  2022 [place].   Bacterial Vaginosis Bacterial vaginosis is an infection of the vagina. It happens when too many normal germs (healthy bacteria) grow in the vagina. This infection can make it easier to get other infections from sex (STIs). It is very important for pregnant women to get treated. This infection can cause babies to be born early or at a low birth weight. What are the causes? This infection is caused by an increase in certain germs that grow in the vagina. You cannot get this infection from toilet seats, bedsheets, swimming pools, or things that touch your vagina. What increases the risk? Having sex with a new person or more than one person. Having sex without protection. Douching. Having an intrauterine device (IUD). Smoking. Using drugs or drinking alcohol. These can lead you to do things that are risky. Taking certain antibiotic medicines. Being pregnant. What are the signs or symptoms? Some women have no symptoms. Symptoms may include: A discharge from your vagina. It may be gray or white. It can be watery or foamy. A fishy smell. This can happen after sex or during your menstrual period. Itching in and around your vagina. A feeling of burning or pain when you pee (urinate). How is this treated? This infection is treated with antibiotic medicines. These may  be given to you as: A pill. A cream for your vagina. A medicine that you  put into your vagina (suppository). If the infection comes back after treatment, you may need more antibiotics. Follow these instructions at home: Medicines Take over-the-counter and prescription medicines as told by your doctor. Take or use your antibiotic medicine as told by your doctor. Do not stop taking or using it, even if you start to feel better. General instructions If the person you have sex with is a woman, tell her that you have this infection. She will need to follow up with her doctor. If you have a female partner, he does not need to be treated. Do not have sex until you finish treatment. Drink enough fluid to keep your pee pale yellow. Keep your vagina and butt clean. Wash the area with warm water each day. Wipe from front to back after you use the toilet. If you are breastfeeding a baby, ask your doctor if you should keep doing so during treatment. Keep all follow-up visits. How is this prevented? Self-care Do not douche. Use only warm water to wash around your vagina. Wear underwear that is cotton or lined with cotton. Do not wear tight pants and pantyhose, especially in the summer. Safe sex Use protection when you have sex. This includes: Use condoms. Use dental dams. This is a thin layer that protects the mouth during oral sex. Limit how many people you have sex with. To prevent this infection, it is best to have sex with just one person. Get tested for STIs. The person you have sex with should also get tested. Drugs and alcohol Do not smoke or use any products that contain nicotine or tobacco. If you need help quitting, ask your doctor. Do not use drugs. Do not drink alcohol if: Your doctor tells you not to drink. You are pregnant, may be pregnant, or are planning to become pregnant. If you drink alcohol: Limit how much you have to 0-1 drink a day. Know how much alcohol is in your drink.  In the [place] one drink equals one 12 oz bottle of beer (355 mL), one 5 oz glass of wine (148 mL), or one 1 oz glass of hard liquor (44 mL). Where to find more information Centers for Disease Control and Prevention: http://www.wolf.info/ American Sexual Health Association: www.ashastd.org Office on [employer] Health: [place] Contact a doctor if: Your symptoms do not get better, even after you are treated. You have more discharge or pain when you pee. You have a fever or chills. You have pain in your belly (abdomen) or in the area between your hips. You have pain with sex. You bleed from your vagina between menstrual periods. Summary This infection can happen when too many germs (bacteria) grow in the vagina. This infection can make it easier to get infections from sex (STIs). Treating this can lower that chance. Get treated if you are pregnant. This infection can cause babies to be born early. Do not stop taking or using your antibiotic medicine, even if you start to feel better. This information is not intended to replace advice given to you by your health care provider. Make sure you discuss any questions you have with your health care provider. Document Revised: 08/10/2019 Document Reviewed: 08/10/2019 Elsevier Patient Education  2022 [place], [place] 12/31/2020

## 2021-01-23 ENCOUNTER — Ambulatory Visit: Payer: 59 | Admitting: Family Medicine

## 2021-01-24 ENCOUNTER — Ambulatory Visit: Payer: 59

## 2021-01-28 ENCOUNTER — Emergency Department (HOSPITAL_BASED_OUTPATIENT_CLINIC_OR_DEPARTMENT_OTHER)
Admission: EM | Admit: 2021-01-28 | Discharge: 2021-01-28 | Disposition: A | Discharge status: Home / Self Care | Payer: 59 | Attending: Emergency Medicine | Admitting: Emergency Medicine

## 2021-01-28 ENCOUNTER — Other Ambulatory Visit: Payer: Self-pay

## 2021-01-28 ENCOUNTER — Encounter (HOSPITAL_BASED_OUTPATIENT_CLINIC_OR_DEPARTMENT_OTHER): Payer: Self-pay

## 2021-01-28 DIAGNOSIS — M546 Pain in thoracic spine: Secondary | ICD-10-CM | POA: Diagnosis not present

## 2021-01-28 DIAGNOSIS — M549 Dorsalgia, unspecified: Secondary | ICD-10-CM | POA: Diagnosis present

## 2021-01-28 MED ORDER — NAPROXEN 250 MG PO TABS
500.0000 mg | ORAL_TABLET | Freq: Once | ORAL | Status: AC
  Administered 2021-01-28: 500 mg via ORAL
  Filled 2021-01-28: qty 2

## 2021-01-28 MED ORDER — ACETAMINOPHEN 325 MG PO TABS
650.0000 mg | ORAL_TABLET | Freq: Once | ORAL | Status: AC
  Administered 2021-01-28: 650 mg via ORAL
  Filled 2021-01-28: qty 2

## 2021-01-28 MED ORDER — NAPROXEN 500 MG PO TABS: 500.0000 mg | ORAL_TABLET | Freq: Two times a day (BID) | ORAL | 0 refills | Status: DC | PRN

## 2021-01-28 MED ORDER — CYCLOBENZAPRINE HCL 10 MG PO TABS: 10.0000 mg | ORAL_TABLET | Freq: Every evening | ORAL | 0 refills | Status: DC | PRN

## 2021-01-28 MED ORDER — LIDOCAINE 5 % EX PTCH
1.0000 | MEDICATED_PATCH | CUTANEOUS | Status: DC
Start: 2021-01-28 — End: 2021-01-29
  Administered 2021-01-28: 1 via TRANSDERMAL
  Filled 2021-01-28: qty 1

## 2021-01-28 NOTE — ED Notes (Signed)
Discharge instructions discussed with pt. Pt verbalized understanding with no questions at this time.  

## 2021-01-28 NOTE — Discharge Instructions (Addendum)
Please remember that these medications are not safe to take during pregnancy.  If you are at all concerned you could be pregnant please take a pregnancy test and verify that it is negative before starting these.  I am prescribing you an anti-inflammatory medication called naproxen.  This is similar to Aleve.  You can take this up to 2 times a day for management of your pain.  Try to take it with a small amount of food to help prevent stomach irritation.  I am prescribing you a strong muscle relaxer called flexeril. Please only take this medication once in the evening with dinner. This medication can make you quite drowsy. Do not mix it with alcohol. Do not drive a vehicle after taking it.   If you develop any new or worsening symptoms please do not hesitate to return to the emergency department. It was a pleasure to meet you.

## 2021-01-28 NOTE — ED Triage Notes (Signed)
Pt c/o pain "to my whole back " and right flank x 3-4 days-NAD-steady gait

## 2021-01-28 NOTE — ED Provider Notes (Addendum)
[facility] EMERGENCY DEPARTMENT Provider Note   CSN: [id number] Arrival date & time: 01/28/21  2126     History Chief Complaint  Patient presents with   Back Pain    Faith Ross is a 30 y.o. female.  HPI Patient is a 30 year old female with a medical history as noted below.  She presents to the emergency department due to right-sided back pain.  States that started about 3 days ago.  It has been waxing and waning but began worsening tonight.  States it worsens with movement as well as twisting.  Patient states that she performs a significant amount of bending as well as heavy lifting at her job and was performing heavy lifting prior to the onset of her symptoms.  Denies any chest pain, shortness of breath, abdominal pain, urinary complaints, numbness, weakness, saddle anesthesia, fevers, night sweats, history of IVDA.    Past Medical History:  Diagnosis Date   Alopecia    Anxiety    Chest wall pain     Patient Active Problem List   Diagnosis Date Noted   Bacterial vaginitis 12/31/2020   Anxiety 12/02/2017   Chest pain 12/02/2017   Palpitations 12/02/2017    Past Surgical History:  Procedure Laterality Date   TONSILLECTOMY       OB History     Gravida  0   Para  0   Term  0   Preterm  0   AB  0   Living  0      SAB  0   IAB  0   Ectopic  0   Multiple  0   Live Births  0           Family History  Problem Relation Age of Onset   Thyroid disease Mother     Social History   Tobacco Use   Smoking status: Never   Smokeless tobacco: Never  Vaping Use   Vaping Use: Never used  Substance Use Topics   Alcohol use: Not Currently   Drug use: Never    Home Medications Prior to Admission medications   Medication Sig Start Date End Date Taking? Authorizing Provider  cyclobenzaprine (FLEXERIL) 10 MG tablet Take 1 tablet (10 mg total) by mouth at bedtime as needed for muscle spasms. 01/28/21  Yes Placido Sou, PA-C  naproxen  (NAPROSYN) 500 MG tablet Take 1 tablet (500 mg total) by mouth 2 (two) times daily as needed. 01/28/21  Yes Placido Sou, PA-C  medroxyPROGESTERone (DEPO-PROVERA) 150 MG/ML injection See admin instructions.    [provider]  penicillin v potassium (VEETID) 500 MG tablet Take 500 mg by mouth 4 (four) times daily. Patient not taking: Reported on 12/05/2020    [provider]    Allergies    Patient has no known allergies.  Review of Systems   Review of Systems  Constitutional:  Negative for chills and fever.  Respiratory:  Negative for shortness of breath.   Cardiovascular:  Negative for chest pain.  Gastrointestinal:  Negative for abdominal pain, nausea and vomiting.  Musculoskeletal:  Positive for back pain and myalgias.  Skin:  Negative for wound.  Neurological:  Negative for weakness and numbness.   Physical Exam Updated Vital Signs BP 120/80 (BP Location: Left Arm)   Pulse 83   Temp 98.5 F (36.9 C) (Oral)   Resp 16   Ht 5\' 6"  (1.676 m)   Wt 55.8 kg   SpO2 100%   BMI 19.85 kg/m  Physical Exam Vitals and nursing note reviewed.  Constitutional:      General: She is not in acute distress.    Appearance: Normal appearance. She is not ill-appearing, toxic-appearing or diaphoretic.  HENT:     Head: Normocephalic and atraumatic.     Right Ear: External ear normal.     Left Ear: External ear normal.     Nose: Nose normal.     Mouth/Throat:     Mouth: Mucous membranes are moist.     Pharynx: Oropharynx is clear. No oropharyngeal exudate or posterior oropharyngeal erythema.  Eyes:     Extraocular Movements: Extraocular movements intact.  Cardiovascular:     Rate and Rhythm: Normal rate.     Pulses: Normal pulses.  Pulmonary:     Effort: Pulmonary effort is normal.  Abdominal:     General: Abdomen is flat.     Palpations: Abdomen is soft.     Tenderness: There is no abdominal tenderness.     Comments: Abdomen is flat, soft, and nontender.   Musculoskeletal:        General: Tenderness present. Normal range of motion.     Cervical back: Normal range of motion and neck supple. No tenderness.     Comments: Moderate TTP noted to the right thoracic musculature.  No midline C, T, or L-spine tenderness.  Skin:    General: Skin is warm and dry.  Neurological:     General: No focal deficit present.     Mental Status: She is alert and oriented to person, place, and time.     Comments: Strength is 5/5 in the bilateral lower extremities.  Distal sensation intact.  2+ pedal pulses.  Psychiatric:        Mood and Affect: Mood normal.        Behavior: Behavior normal.   ED Results / Procedures / Treatments   Labs (all labs ordered are listed, but only abnormal results are displayed) Labs Reviewed - No data to display  EKG None  Radiology No results found.  Procedures Procedures   Medications Ordered in ED Medications  naproxen (NAPROSYN) tablet 500 mg (has no administration in time range)  acetaminophen (TYLENOL) tablet 650 mg (has no administration in time range)  lidocaine (LIDODERM) 5 % 1 patch (has no administration in time range)    ED Course  I have reviewed the triage vital signs and the nursing notes.  Pertinent labs & imaging results that were available during my care of the patient were reviewed by me and considered in my medical decision making (see chart for details).    MDM Rules/Calculators/A&P                          Patient is a 30 year old female who presents to the emergency department due to right-sided back pain that began about 3 days ago.  Patient performs a significant amount of heavy lifting at her job and was performing heavy lifting prior to the onset of her symptoms.  Patient has palpable tenderness in the right thoracic region.  No midline spine pain.  No overlying skin changes concerning for zoster.  Patient denies any systemic complaint such as fevers, chills, nausea, vomiting.  Also denies  any urinary complaints.  Patient currently afebrile and not tachycardic.  Doubt infectious etiology.  Denies any night sweats or IVDA.  Patient neurovascularly intact in the bilateral lower extremities.  Denies numbness, weakness, bowel/bladder incontinence.  Exam not consistent with  cauda equina at this time.  Will treat with naproxen and Tylenol as well as a Lidoderm patch, as patient is driving home tonight.  Will prescribe a short course of naproxen as well as Flexeril.  Patient states she is not sexually active and denies any possibility of being pregnant.  Patient understands that these medications are not safe to take during pregnancy and verbalized understanding.  Feel that patient is stable for discharge at this time and she is agreeable.  We discussed return precautions.  Her questions were answered and she was amicable at the time of discharge.  Final Clinical Impression(s) / ED Diagnoses Final diagnoses:  Acute right-sided thoracic back pain   Rx / DC Orders ED Discharge Orders          Ordered    cyclobenzaprine (FLEXERIL) 10 MG tablet  At bedtime PRN        01/28/21 2208    naproxen (NAPROSYN) 500 MG tablet  2 times daily PRN        01/28/21 2208             Rayna Sexton, PA-C 01/28/21 2216    Rayna Sexton, PA-C 01/28/21 2216    Lorelle Gibbs, DO 01/28/21 2307

## 2021-02-02 ENCOUNTER — Emergency Department (HOSPITAL_BASED_OUTPATIENT_CLINIC_OR_DEPARTMENT_OTHER)
Admission: EM | Admit: 2021-02-02 | Discharge: 2021-02-02 | Disposition: A | Discharge status: Home / Self Care | Payer: 59 | Attending: Emergency Medicine | Admitting: Emergency Medicine

## 2021-02-02 ENCOUNTER — Other Ambulatory Visit: Payer: Self-pay

## 2021-02-02 ENCOUNTER — Encounter (HOSPITAL_BASED_OUTPATIENT_CLINIC_OR_DEPARTMENT_OTHER): Payer: Self-pay

## 2021-02-02 DIAGNOSIS — M62830 Muscle spasm of back: Secondary | ICD-10-CM | POA: Insufficient documentation

## 2021-02-02 DIAGNOSIS — M549 Dorsalgia, unspecified: Secondary | ICD-10-CM | POA: Diagnosis present

## 2021-02-02 NOTE — ED Triage Notes (Signed)
Pt reports back pain throughout entire back, was seen for same 12/6, states was instructed to return if not improved.  Taking naproxen and flexaril with some improvement

## 2021-02-02 NOTE — ED Provider Notes (Signed)
[facility] EMERGENCY DEPARTMENT Provider Note   CSN: [id number] Arrival date & time: 02/02/21  1747     History Chief Complaint  Patient presents with   Back Pain    Faith Ross is a 30 y.o. female.  30 year old female returns with ongoing pain in her back. [place] lot of heavy lifting and twisting and at the end of the day is having pain and spasms. No specific injury.  Patient was seen in this emergency room on December 6, given naproxen and Flexeril which are helping with her pain but pain persists and she states she was told to return to the emergency room if continuing to have pain for further work-up.      Past Medical History:  Diagnosis Date   Alopecia    Anxiety    Chest wall pain     Patient Active Problem List   Diagnosis Date Noted   Bacterial vaginitis 12/31/2020   Anxiety 12/02/2017   Chest pain 12/02/2017   Palpitations 12/02/2017    Past Surgical History:  Procedure Laterality Date   TONSILLECTOMY       OB History     Gravida  0   Para  0   Term  0   Preterm  0   AB  0   Living  0      SAB  0   IAB  0   Ectopic  0   Multiple  0   Live Births  0           Family History  Problem Relation Age of Onset   Thyroid disease Mother     Social History   Tobacco Use   Smoking status: Never   Smokeless tobacco: Never  Vaping Use   Vaping Use: Never used  Substance Use Topics   Alcohol use: Not Currently   Drug use: Never    Home Medications Prior to Admission medications   Medication Sig Start Date End Date Taking? Authorizing Provider  cyclobenzaprine (FLEXERIL) 10 MG tablet Take 1 tablet (10 mg total) by mouth at bedtime as needed for muscle spasms. 01/28/21   Placido Sou, PA-C  medroxyPROGESTERone (DEPO-PROVERA) 150 MG/ML injection See admin instructions.    [provider]  naproxen (NAPROSYN) 500 MG tablet Take 1 tablet (500 mg total) by mouth 2  (two) times daily as needed. 01/28/21   Placido Sou, PA-C  penicillin v potassium (VEETID) 500 MG tablet Take 500 mg by mouth 4 (four) times daily. Patient not taking: Reported on 12/05/2020    [provider]    Allergies    Patient has no known allergies.  Review of Systems   Review of Systems  Constitutional:  Negative for fever.  Gastrointestinal:  Negative for abdominal pain, constipation, diarrhea, nausea and vomiting.  Genitourinary:  Negative for difficulty urinating.  Musculoskeletal:  Positive for back pain. Negative for gait problem.  Skin:  Negative for rash and wound.  Allergic/Immunologic: Negative for immunocompromised state.  Neurological:  Negative for weakness and numbness.  All other systems reviewed and are negative.  Physical Exam Updated Vital Signs BP 110/78   Pulse 76   Temp 98 F (36.7 C)   Resp 16   Ht 5\' 6"  (1.676 m)   Wt 55.8 kg   SpO2 100%   BMI 19.85 kg/m   Physical Exam Vitals and nursing note reviewed.  Constitutional:      General: She is not  in acute distress.    Appearance: She is well-developed. She is not diaphoretic.  HENT:     Head: Normocephalic and atraumatic.  Pulmonary:     Effort: Pulmonary effort is normal.  Abdominal:     Palpations: Abdomen is soft.     Tenderness: There is no abdominal tenderness.  Musculoskeletal:        General: No swelling, tenderness or deformity. Normal range of motion.     Cervical back: No tenderness or bony tenderness. No pain with movement.     Thoracic back: No spasms, tenderness or bony tenderness. Normal range of motion. No scoliosis.     Lumbar back: No spasms, tenderness or bony tenderness. Normal range of motion. Negative right straight leg raise test and negative left straight leg raise test. No scoliosis.  Skin:    General: Skin is warm and dry.     Findings: No erythema or rash.  Neurological:     Mental Status: She is alert and oriented to person, place, and time.      Sensory: No sensory deficit.     Motor: No weakness.     Gait: Gait normal.  Psychiatric:        Behavior: Behavior normal.    ED Results / Procedures / Treatments   Labs (all labs ordered are listed, but only abnormal results are displayed) Labs Reviewed - No data to display  EKG None  Radiology No results found.  Procedures Procedures   Medications Ordered in ED Medications - No data to display  ED Course  I have reviewed the triage vital signs and the nursing notes.  Pertinent labs & imaging results that were available during my care of the patient were reviewed by me and considered in my medical decision making (see chart for details).  Clinical Course as of 02/02/21 1931  Sun Feb 02, 2878  [id number] 29 year old female with ongoing back pain as above.  No red flag symptoms, exam is unremarkable.  Reports improvement with her Flexeril and naproxen.  Do not feel further evaluation or work-up is necessary in the emergency room however recommend that she follow-up with her job to discuss [employer] and referral to physical therapy.  Patient reports return of her pain after doing a lot of heavy lifting and twisting at work, may need assistance with proper body mechanics.  She can continue with the Flexeril naproxen.  She will call her employer to discuss Worker's Comp. is having given referral to sports medicine if she chooses not to file Worker's Comp. [LM]    Clinical Course User Index [LM] Roque Lias   MDM Rules/Calculators/A&P                           Final Clinical Impression(s) / ED Diagnoses Final diagnoses:  Muscle spasm of back    Rx / DC Orders ED Discharge Orders     None        Roque Lias 02/02/21 1931    Long, [facility], MD 02/06/21 [phone]

## 2021-02-07 ENCOUNTER — Telehealth: Payer: Self-pay | Admitting: Family Medicine

## 2021-02-07 ENCOUNTER — Ambulatory Visit (INDEPENDENT_AMBULATORY_CARE_PROVIDER_SITE_OTHER): Payer: 59

## 2021-02-07 ENCOUNTER — Encounter (INDEPENDENT_AMBULATORY_CARE_PROVIDER_SITE_OTHER): Payer: Self-pay

## 2021-02-07 ENCOUNTER — Other Ambulatory Visit: Payer: Self-pay

## 2021-02-07 DIAGNOSIS — Z3042 Encounter for surveillance of injectable contraceptive: Secondary | ICD-10-CM

## 2021-02-07 MED ORDER — MEDROXYPROGESTERONE ACETATE 150 MG/ML IM SUSP
150.0000 mg | Freq: Once | INTRAMUSCULAR | Status: AC
Start: 2021-02-07 — End: 2021-02-07
  Administered 2021-02-07: 150 mg via INTRAMUSCULAR

## 2021-02-07 NOTE — Telephone Encounter (Signed)
LVM- out of window for next Depo injection Received Sept 2 Due- Nov 18 -Dec 2.   Needs to schedule an appt for NV-  Urine preg and depo shot.

## 2021-02-07 NOTE — Telephone Encounter (Signed)
Pt needs clarification about next depo shot scheduliing and if it possible to come today in the afternoon w/ the nurse. Please advise and thank you.

## 2021-03-06 ENCOUNTER — Other Ambulatory Visit: Payer: Self-pay

## 2021-03-06 ENCOUNTER — Ambulatory Visit (INDEPENDENT_AMBULATORY_CARE_PROVIDER_SITE_OTHER): Payer: 59 | Admitting: Nurse Practitioner

## 2021-03-06 ENCOUNTER — Encounter: Payer: Self-pay | Admitting: Nurse Practitioner

## 2021-03-06 VITALS — BP 117/76 | HR 84 | Resp 18 | Ht 66.0 in | Wt 120.0 lb

## 2021-03-06 DIAGNOSIS — N390 Urinary tract infection, site not specified: Secondary | ICD-10-CM

## 2021-03-06 DIAGNOSIS — N898 Other specified noninflammatory disorders of vagina: Secondary | ICD-10-CM | POA: Diagnosis not present

## 2021-03-06 DIAGNOSIS — K047 Periapical abscess without sinus: Secondary | ICD-10-CM | POA: Insufficient documentation

## 2021-03-06 LAB — POCT URINALYSIS DIP (CLINITEK)
Bilirubin, UA: NEGATIVE
Blood, UA: NEGATIVE
Glucose, UA: NEGATIVE mg/dL
Ketones, POC UA: NEGATIVE mg/dL
Nitrite, UA: POSITIVE — AB
POC PROTEIN,UA: NEGATIVE
Spec Grav, UA: 1.015 (ref 1.010–1.025)
Urobilinogen, UA: 0.2 E.U./dL
pH, UA: 5.5 (ref 5.0–8.0)

## 2021-03-06 MED ORDER — AMOXICILLIN-POT CLAVULANATE 875-125 MG PO TABS: 1.0000 | ORAL_TABLET | Freq: Two times a day (BID) | ORAL | 0 refills | Status: DC

## 2021-03-06 NOTE — Assessment & Plan Note (Signed)
Left facial swelling °Dental abcess: ° °Will order augmentin ° °Warm salt water gargles ° °Please follow up with dentist ° ° °Dysuria: ° °Will check UA ° ° °Follow up: ° °Follow up as needed °

## 2021-03-06 NOTE — Patient Instructions (Addendum)
Left facial swelling Dental abcess:  Will order augmentin  Warm salt water gargles  Please follow up with dentist   Dysuria:  Will check UA   Follow up:  Follow up as needed

## 2021-03-06 NOTE — Progress Notes (Signed)
Left side face pain and swelling goes down the neck

## 2021-03-06 NOTE — Progress Notes (Signed)
@Patient  ID: Faith Ross, female    DOB: Jul 21, 1990, 31 y.o.   MRN: [id number]  Chief Complaint  Patient presents with   Facial Pain   Facial Swelling    Referring provider: Dorna Mai, MD    HPI  Patient presents today for acute visit.  She states for the past 2 weeks she has been having left-sided face and neck pain with swelling.  She did have the same issue in August and was seen in the ED and treated with clindamycin for a dental abscess.  She states that she did get better but symptoms have reappeared over the last couple weeks.  She did not follow with any dentist due to financial concerns.  Patient also complains today of UTI symptoms: Dark urine, strong odor, painful urination.Denies f/c/s, n/v/d, hemoptysis, PND, chest pain or edema.    No Known Allergies   There is no immunization history on file for this patient.  Past Medical History:  Diagnosis Date   Alopecia    Anxiety    Chest wall pain     Tobacco History: Social History   Tobacco Use  Smoking Status Never  Smokeless Tobacco Never   Counseling given: Not Answered   Outpatient Encounter Medications as of 03/06/2021  Medication Sig   amoxicillin-clavulanate (AUGMENTIN) 875-125 MG tablet Take 1 tablet by mouth 2 (two) times daily.   medroxyPROGESTERone (DEPO-PROVERA) 150 MG/ML injection See admin instructions.   cyclobenzaprine (FLEXERIL) 10 MG tablet Take 1 tablet (10 mg total) by mouth at bedtime as needed for muscle spasms. (Patient not taking: Reported on 03/06/2021)   naproxen (NAPROSYN) 500 MG tablet Take 1 tablet (500 mg total) by mouth 2 (two) times daily as needed. (Patient not taking: Reported on 03/06/2021)   [DISCONTINUED] penicillin v potassium (VEETID) 500 MG tablet Take 500 mg by mouth 4 (four) times daily. (Patient not taking: Reported on 12/05/2020)   No facility-administered encounter medications on file as of 03/06/2021.     Review of Systems  Review of Systems   Constitutional: Negative.  Negative for fever.  HENT:  Positive for facial swelling.   Cardiovascular: Negative.   Gastrointestinal: Negative.   Genitourinary:  Positive for dysuria and frequency.  Allergic/Immunologic: Negative.   Neurological: Negative.   Psychiatric/Behavioral: Negative.        Physical Exam  BP 117/76    Pulse 84    Resp 18    Ht 5\' 6"  (1.676 m)    Wt 120 lb (54.4 kg)    SpO2 98%    BMI 19.37 kg/m   Wt Readings from Last 5 Encounters:  03/06/21 120 lb (54.4 kg)  02/02/21 123 lb (55.8 kg)  01/28/21 123 lb (55.8 kg)  12/27/20 120 lb (54.4 kg)  12/05/20 118 lb (53.5 kg)     Physical Exam Vitals and nursing note reviewed.  Constitutional:      General: She is not in acute distress.    Appearance: She is well-developed.  HENT:     Mouth/Throat:      Comments: Redness and swelling noted along gum lined as marked. Neck:      Comments: Swelling noted to area marked, tender to touch.  Cardiovascular:     Rate and Rhythm: Normal rate and regular rhythm.  Pulmonary:     Effort: Pulmonary effort is normal.     Breath sounds: Normal breath sounds.  Neurological:     Mental Status: She is alert and oriented to person, place, and time.  Lab Results:  CBC    Component Value Date/Time   WBC 2.5 (L) 07/17/2020 1421   WBC 2.6 (L) 06/14/2020 0628   RBC 3.71 (L) 07/17/2020 1421   HGB 11.3 (L) 07/17/2020 1421   HGB 11.4 06/16/2020 1544   HCT 32.2 (L) 07/17/2020 1421   HCT 34.2 06/16/2020 1544   PLT 225 07/17/2020 1421   PLT 229 06/16/2020 1544   MCV 86.8 07/17/2020 1421   MCV 91 06/16/2020 1544   MCH 30.5 07/17/2020 1421   MCHC 35.1 07/17/2020 1421   RDW 12.2 07/17/2020 1421   RDW 12.2 06/16/2020 1544   LYMPHSABS 1.4 07/17/2020 1421   LYMPHSABS 1.2 06/16/2020 1544   MONOABS 0.2 07/17/2020 1421   EOSABS 0.1 07/17/2020 1421   EOSABS 0.0 06/16/2020 1544   BASOSABS 0.0 07/17/2020 1421   BASOSABS 0.0 06/16/2020 1544    BMET    Component  Value Date/Time   NA 138 07/17/2020 1421   NA 138 06/16/2020 1544   K 3.4 (L) 07/17/2020 1421   CL 105 07/17/2020 1421   CO2 26 07/17/2020 1421   GLUCOSE 101 (H) 07/17/2020 1421   BUN 9 07/17/2020 1421   BUN 6 06/16/2020 1544   CREATININE 0.87 07/17/2020 1421   CALCIUM 9.9 07/17/2020 1421   GFRNONAA >60 07/17/2020 1421   GFRAA 114 06/05/2019 1630    BNP No results found for: BNP  ProBNP No results found for: PROBNP  Imaging: No results found.   Assessment & Plan:   Dental abscess Left facial swelling Dental abcess:  Will order augmentin  Warm salt water gargles  Please follow up with dentist   Dysuria:  Will check UA   Follow up:  Follow up as needed     Fenton Foy, NP 03/06/2021

## 2021-03-10 LAB — URINE CULTURE

## 2021-04-01 ENCOUNTER — Other Ambulatory Visit: Payer: Self-pay

## 2021-04-01 ENCOUNTER — Emergency Department (HOSPITAL_BASED_OUTPATIENT_CLINIC_OR_DEPARTMENT_OTHER)
Admission: EM | Admit: 2021-04-01 | Discharge: 2021-04-02 | Disposition: A | Discharge status: Home / Self Care | Payer: 59 | Attending: Emergency Medicine | Admitting: Emergency Medicine

## 2021-04-01 ENCOUNTER — Encounter (HOSPITAL_BASED_OUTPATIENT_CLINIC_OR_DEPARTMENT_OTHER): Payer: Self-pay | Admitting: *Deleted

## 2021-04-01 DIAGNOSIS — J069 Acute upper respiratory infection, unspecified: Secondary | ICD-10-CM

## 2021-04-01 DIAGNOSIS — Z20822 Contact with and (suspected) exposure to covid-19: Secondary | ICD-10-CM | POA: Diagnosis not present

## 2021-04-01 DIAGNOSIS — J3489 Other specified disorders of nose and nasal sinuses: Secondary | ICD-10-CM | POA: Insufficient documentation

## 2021-04-01 DIAGNOSIS — R059 Cough, unspecified: Secondary | ICD-10-CM | POA: Insufficient documentation

## 2021-04-01 LAB — RESP PANEL BY RT-PCR (FLU A&B, COVID) ARPGX2
Influenza A by PCR: NEGATIVE
Influenza B by PCR: NEGATIVE
SARS Coronavirus 2 by RT PCR: NEGATIVE

## 2021-04-01 LAB — PREGNANCY, URINE: Preg Test, Ur: NEGATIVE

## 2021-04-01 MED ORDER — BENZONATATE 100 MG PO CAPS
200.0000 mg | ORAL_CAPSULE | Freq: Once | ORAL | Status: AC
  Administered 2021-04-01: 200 mg via ORAL
  Filled 2021-04-01: qty 2

## 2021-04-01 NOTE — ED Triage Notes (Signed)
Cough and congestion x 2 days 

## 2021-04-01 NOTE — ED Notes (Signed)
Pt. Reports she has anxiety and feels like she can't breath with the cough and the nasal congestion.

## 2021-04-01 NOTE — ED Notes (Signed)
No distress noted in Pt.

## 2021-04-02 ENCOUNTER — Encounter (HOSPITAL_BASED_OUTPATIENT_CLINIC_OR_DEPARTMENT_OTHER): Payer: Self-pay | Admitting: Emergency Medicine

## 2021-04-02 MED ORDER — BENZONATATE 100 MG PO CAPS: 100.0000 mg | ORAL_CAPSULE | Freq: Three times a day (TID) | ORAL | 0 refills | Status: DC

## 2021-04-02 NOTE — ED Provider Notes (Signed)
[facility] EMERGENCY DEPARTMENT Provider Note   CSN: [id number] Arrival date & time: 04/01/21  2012     History  Chief Complaint  Patient presents with   Cough    Faith Ross is a 31 y.o. female.  The history is provided by the patient.  Cough Severity:  Moderate Onset quality:  Gradual Duration:  2 days Timing:  Intermittent Progression:  Unchanged Chronicity:  New Context: upper respiratory infection   Context: not animal exposure   Relieved by:  Nothing Worsened by:  Nothing Ineffective treatments:  None tried Associated symptoms: rhinorrhea   Associated symptoms: no chest pain, no chills, no diaphoresis, no ear fullness, no ear pain, no eye discharge, no fever, no headaches, no myalgias, no rash and no shortness of breath   Risk factors: no chemical exposure       Home Medications Prior to Admission medications   Medication Sig Start Date End Date Taking? Authorizing Provider  amoxicillin-clavulanate (AUGMENTIN) 875-125 MG tablet Take 1 tablet by mouth 2 (two) times daily. 03/06/21   Fenton Foy, NP  cyclobenzaprine (FLEXERIL) 10 MG tablet Take 1 tablet (10 mg total) by mouth at bedtime as needed for muscle spasms. Patient not taking: Reported on 03/06/2021 01/28/21   Rayna Sexton, PA-C  medroxyPROGESTERone (DEPO-PROVERA) 150 MG/ML injection See admin instructions.    [provider]  naproxen (NAPROSYN) 500 MG tablet Take 1 tablet (500 mg total) by mouth 2 (two) times daily as needed. Patient not taking: Reported on 03/06/2021 01/28/21   Rayna Sexton, PA-C      Allergies    Patient has no known allergies.    Review of Systems   Review of Systems  Constitutional:  Negative for chills, diaphoresis and fever.  HENT:  Positive for rhinorrhea. Negative for ear pain.   Eyes:  Negative for discharge.  Respiratory:  Positive for cough. Negative for shortness of breath.   Cardiovascular:  Negative for chest pain.  Gastrointestinal:   Negative for abdominal pain.  Genitourinary:  Negative for difficulty urinating.  Musculoskeletal:  Negative for myalgias.  Skin:  Negative for rash.  Neurological:  Negative for headaches.  Psychiatric/Behavioral:  Negative for agitation.   All other systems reviewed and are negative.  Physical Exam Updated Vital Signs BP 132/83    Pulse 75    Temp 98.8 F (37.1 C) (Oral)    Resp 17    Ht 5\' 6"  (1.676 m)    Wt 54.4 kg    SpO2 99%    BMI 19.36 kg/m  Physical Exam Vitals and nursing note reviewed.  Constitutional:      General: She is not in acute distress.    Appearance: Normal appearance.  HENT:     Head: Normocephalic and atraumatic.     Nose: Congestion present.  Eyes:     Conjunctiva/sclera: Conjunctivae normal.     Pupils: Pupils are equal, round, and reactive to light.  Cardiovascular:     Rate and Rhythm: Normal rate and regular rhythm.     Pulses: Normal pulses.     Heart sounds: Normal heart sounds.  Pulmonary:     Effort: Pulmonary effort is normal.  Abdominal:     General: Abdomen is flat. Bowel sounds are normal.     Palpations: Abdomen is soft.     Tenderness: There is no abdominal tenderness. There is no guarding.  Musculoskeletal:        General: Normal range of motion.     Cervical back:  Normal range of motion and neck supple.  Skin:    General: Skin is warm and dry.     Capillary Refill: Capillary refill takes less than 2 seconds.  Neurological:     General: No focal deficit present.     Mental Status: She is alert and oriented to person, place, and time.     Deep Tendon Reflexes: Reflexes normal.  Psychiatric:        Mood and Affect: Mood normal.        Behavior: Behavior normal.    ED Results / Procedures / Treatments   Labs (all labs ordered are listed, but only abnormal results are displayed) Labs Reviewed  RESP PANEL BY RT-PCR (FLU A&B, COVID) [id number]  PREGNANCY, URINE    EKG None  Radiology No results found.  Procedures Procedures     Medications Ordered in ED Medications  benzonatate (TESSALON) capsule 200 mg (200 mg Oral Given 04/01/21 2357)    ED Course/ Medical Decision Making/ A&P                           Medical Decision Making URI for 2 days   Amount and/or Complexity of Data Reviewed Labs: ordered.    Details: negative covid and flu  Risk Prescription drug management. Risk Details: Considered hospitalization.  But patient is well appearing and normal exam and vitals.  I do not believe patient has PNA because lungs are clear with normal vitals.  I do not believe a chest XRay is indicated at this time.  Stable for discharge with close follow up    Final Clinical Impression(s) / ED Diagnoses Final diagnoses:  None   Return for intractable cough, coughing up blood, fevers > 100.4 unrelieved by medication, shortness of breath, intractable vomiting, chest pain, shortness of breath, weakness, numbness, changes in speech, facial asymmetry, abdominal pain, passing out, Inability to tolerate liquids or food, cough, altered mental status or any concerns. No signs of systemic illness or infection. The patient is nontoxic-appearing on exam and vital signs are within normal limits.  I have reviewed the triage vital signs and the nursing notes. Pertinent labs & imaging results that were available during my care of the patient were reviewed by me and considered in my medical decision making (see chart for details). After history, exam, and medical workup I feel the patient has been appropriately medically screened and is safe for discharge home. Pertinent diagnoses were discussed with the patient. Patient was given return precautions. Rx / DC Orders ED Discharge Orders     None         Allyssia Skluzacek, MD 04/02/21 [id number]

## 2021-04-22 ENCOUNTER — Other Ambulatory Visit: Payer: Self-pay

## 2021-04-22 ENCOUNTER — Ambulatory Visit (INDEPENDENT_AMBULATORY_CARE_PROVIDER_SITE_OTHER): Payer: 59 | Admitting: Physician Assistant

## 2021-04-22 ENCOUNTER — Encounter: Payer: Self-pay | Admitting: Physician Assistant

## 2021-04-22 VITALS — BP 114/78 | HR 87 | Temp 98.2°F | Resp 18 | Ht 66.0 in | Wt 123.0 lb

## 2021-04-22 DIAGNOSIS — R002 Palpitations: Secondary | ICD-10-CM

## 2021-04-22 DIAGNOSIS — F411 Generalized anxiety disorder: Secondary | ICD-10-CM | POA: Diagnosis not present

## 2021-04-22 DIAGNOSIS — K047 Periapical abscess without sinus: Secondary | ICD-10-CM | POA: Diagnosis not present

## 2021-04-22 LAB — EKG 12-LEAD

## 2021-04-22 MED ORDER — SERTRALINE HCL 25 MG PO TABS: 25.0000 mg | ORAL_TABLET | Freq: Every day | ORAL | 2 refills | Status: DC

## 2021-04-22 MED ORDER — AMOXICILLIN-POT CLAVULANATE 875-125 MG PO TABS: 1.0000 | ORAL_TABLET | Freq: Two times a day (BID) | ORAL | 0 refills | Status: DC

## 2021-04-22 MED ORDER — HYDROXYZINE HCL 10 MG PO TABS: 10.0000 mg | ORAL_TABLET | Freq: Three times a day (TID) | ORAL | 1 refills | Status: AC | PRN

## 2021-04-22 NOTE — Progress Notes (Signed)
Established Patient Office Visit  Subjective:  Patient ID: Faith Ross, female    DOB: 09-06-1990  Age: 31 y.o. MRN: [id number]  CC:  Chief Complaint  Patient presents with   Referral    Cardio    HPI Faith Ross reports that she has been having episodes of feeling her heart racing, states that when she starts moving around she will become short of breath, states that yesterday she had a panic attack due to the feelings of heart racing and shortness of breath.  Reports that she previously was diagnosed with PAC's back in 2018, states that she was living in [place] and her medical provider wanted her to have a stress test completed, however she was unable to complete this due to financial constraints.  States that she has been previously diagnosed with anxiety and panic attacks.  States that her stress levels have been elevated, she is currently working 2 jobs.  States that she has been having difficulty falling asleep, does endorse that she will sleep 7 to 8 hours but it can take up to 3 hours for her to fall asleep.  Reports that she has previously taken melatonin with some relief.  States that she was seen for anxiety by her primary care provider in August 2022, declined medication, but did agree to a referral for CBT.  Reports that the visits were going to be virtual and she did not want to continue with virtual visits.  States that she has been on several medications in the past, states that she has previously failed BuSpar, Effexor, Xanax, and Lexapro.  States that she does take a women's multivitamin and elderberry.   Reports that she has a continued dental abscess on her bottom left near the back.  Reports that she previously has taken an antibiotic regimen with some relief.  States that she is working to be seen by dentistry.   Past Medical History:  Diagnosis Date   Alopecia    Anxiety    Chest wall pain     Past Surgical History:  Procedure Laterality Date    TONSILLECTOMY      Family History  Problem Relation Age of Onset   Thyroid disease Mother     Social History   Socioeconomic History   Marital status: Single    Spouse name: Not on file   Number of children: Not on file   Years of education: Not on file   Highest education level: Not on file  Occupational History   Not on file  Tobacco Use   Smoking status: Never   Smokeless tobacco: Never  Vaping Use   Vaping Use: Never used  Substance and Sexual Activity   Alcohol use: Not Currently   Drug use: Never   Sexual activity: Not Currently    Birth control/protection: None  Other Topics Concern   Not on file  Social History Narrative   Not on file   Social Determinants of Health   Financial Resource Strain: Not on file  Food Insecurity: Not on file  Transportation Needs: Not on file  Physical Activity: Not on file  Stress: Not on file  Social Connections: Not on file  Intimate Partner Violence: Not on file    Outpatient Medications Prior to Visit  Medication Sig Dispense Refill   cyclobenzaprine (FLEXERIL) 10 MG tablet Take 1 tablet (10 mg total) by mouth at bedtime as needed for muscle spasms. 8 tablet 0   medroxyPROGESTERone (DEPO-PROVERA) 150 MG/ML injection See admin instructions.  naproxen (NAPROSYN) 500 MG tablet Take 1 tablet (500 mg total) by mouth 2 (two) times daily as needed. 20 tablet 0   amoxicillin-clavulanate (AUGMENTIN) 875-125 MG tablet Take 1 tablet by mouth 2 (two) times daily. 20 tablet 0   benzonatate (TESSALON) 100 MG capsule Take 1 capsule (100 mg total) by mouth every 8 (eight) hours. 21 capsule 0   No facility-administered medications prior to visit.    No Known Allergies  ROS Review of Systems  Constitutional:  Negative for chills and fever.  HENT:  Positive for dental problem.   Eyes: Negative.   Respiratory:  Positive for shortness of breath.   Cardiovascular:  Positive for palpitations. Negative for chest pain.   Gastrointestinal:  Negative for nausea and vomiting.  Endocrine: Negative.   Genitourinary: Negative.   Musculoskeletal: Negative.   Skin: Negative.   Allergic/Immunologic: Negative.   Neurological: Negative.   Hematological: Negative.   Psychiatric/Behavioral:  Positive for dysphoric mood and sleep disturbance. Negative for self-injury and suicidal ideas. The patient is nervous/anxious.      Objective:    Physical Exam Vitals and nursing note reviewed.  Constitutional:      Appearance: Normal appearance.  HENT:     Head: Normocephalic and atraumatic.     Jaw: Tenderness and swelling present.     Comments: Lower left    Right Ear: External ear normal.     Left Ear: External ear normal.     Nose: Nose normal.     Mouth/Throat:     Mouth: Mucous membranes are moist.     Dentition: Abnormal dentition. Dental tenderness and dental abscesses present.     Pharynx: Oropharynx is clear.      Comments: Lower left  Eyes:     Extraocular Movements: Extraocular movements intact.     Conjunctiva/sclera: Conjunctivae normal.     Pupils: Pupils are equal, round, and reactive to light.  Cardiovascular:     Rate and Rhythm: Normal rate and regular rhythm.     Pulses: Normal pulses.     Heart sounds: Normal heart sounds.  Pulmonary:     Effort: Pulmonary effort is normal.     Breath sounds: Normal breath sounds.  Musculoskeletal:        General: Normal range of motion.     Cervical back: Normal range of motion and neck supple.  Skin:    General: Skin is warm and dry.  Neurological:     General: No focal deficit present.     Mental Status: She is alert and oriented to person, place, and time.  Psychiatric:        Mood and Affect: Mood normal.        Behavior: Behavior normal.        Thought Content: Thought content normal.        Judgment: Judgment normal.    BP 114/78 (BP Location: Left Arm, Patient Position: Sitting, Cuff Size: Normal)    Pulse 87    Temp 98.2 F (36.8 C)  (Oral)    Resp 18    Ht '5\' 6"'  (1.676 m)    Wt 123 lb (55.8 kg)    LMP 02/13/2021    SpO2 98%    BMI 19.85 kg/m  Wt Readings from Last 3 Encounters:  04/22/21 123 lb (55.8 kg)  04/01/21 119 lb 14.9 oz (54.4 kg)  03/06/21 120 lb (54.4 kg)     Health Maintenance Due  Topic Date Due   COVID-19 Vaccine (1) Never  done   HIV Screening  Never done   Hepatitis C Screening  Never done   TETANUS/TDAP  Never done   INFLUENZA VACCINE  Never done    There are no preventive care reminders to display for this patient.  Lab Results  Component Value Date   TSH 1.240 04/22/2021   Lab Results  Component Value Date   WBC 2.8 (L) 04/22/2021   HGB 11.7 04/22/2021   HCT 35.4 04/22/2021   MCV 90 04/22/2021   PLT 238 04/22/2021   Lab Results  Component Value Date   NA 137 04/22/2021   K 3.6 04/22/2021   CO2 26 07/17/2020   GLUCOSE 84 04/22/2021   BUN 10 04/22/2021   CREATININE 0.94 04/22/2021   BILITOT 0.3 04/22/2021   ALKPHOS 39 (L) 04/22/2021   AST 24 04/22/2021   ALT 15 07/17/2020   PROT 8.7 (H) 04/22/2021   ALBUMIN 4.3 04/22/2021   CALCIUM 9.4 04/22/2021   ANIONGAP 7 07/17/2020   EGFR 83 04/22/2021   Lab Results  Component Value Date   CHOL 132 06/05/2019   Lab Results  Component Value Date   HDL 45 06/05/2019   Lab Results  Component Value Date   LDLCALC 77 06/05/2019   Lab Results  Component Value Date   TRIG 42 06/05/2019   Lab Results  Component Value Date   CHOLHDL 2.9 06/05/2019   No results found for: HGBA1C    Assessment & Plan:   Problem List Items Addressed This Visit       Digestive   Dental abscess   Relevant Medications   amoxicillin-clavulanate (AUGMENTIN) 875-125 MG tablet     Other   Palpitations   Relevant Orders   CBC with Differential/Platelet (Completed)   Comp. Metabolic Panel (12) (Completed)   TSH (Completed)   EKG 12-Lead (Completed)   GAD (generalized anxiety disorder) - Primary   Relevant Medications   sertraline (ZOLOFT)  25 MG tablet   hydrOXYzine (ATARAX) 10 MG tablet   Other Relevant Orders   Ambulatory referral to Social Work    Meds ordered this encounter  Medications   sertraline (ZOLOFT) 25 MG tablet    Sig: Take 1 tablet (25 mg total) by mouth daily.    Dispense:  30 tablet    Refill:  2    Order Specific Question:   Supervising Provider    Answer:   Asencion Noble E [[id number]]   hydrOXYzine (ATARAX) 10 MG tablet    Sig: Take 1 tablet (10 mg total) by mouth 3 (three) times daily as needed.    Dispense:  60 tablet    Refill:  1    Order Specific Question:   Supervising Provider    Answer:   Elsie Stain [[id number]]   amoxicillin-clavulanate (AUGMENTIN) 875-125 MG tablet    Sig: Take 1 tablet by mouth 2 (two) times daily.    Dispense:  20 tablet    Refill:  0    Order Specific Question:   Supervising Provider    Answer:   WRIGHT, PATRICK E [[id number]]   1. GAD (generalized anxiety disorder) Patient agreeable to trial Zoloft, hydroxyzine.  Patient encouraged to continue melatonin to help with insomnia.  Patient agreeable to referral to clinic counselor.  Patient education given on coping skills.  Red flags given for prompt reevaluation. - Ambulatory referral to Social Work - sertraline (ZOLOFT) 25 MG tablet; Take 1 tablet (25 mg total) by mouth daily.  Dispense: 30 tablet; Refill: 2 -  hydrOXYzine (ATARAX) 10 MG tablet; Take 1 tablet (10 mg total) by mouth 3 (three) times daily as needed.  Dispense: 60 tablet; Refill: 1  2. Palpitations EKG within normal limits. - CBC with Differential/Platelet - Comp. Metabolic Panel (12) - TSH - EKG 12-Lead  3. Dental abscess Trial Augmentin, patient encouraged to follow-up with dentistry as soon as able. - amoxicillin-clavulanate (AUGMENTIN) 875-125 MG tablet; Take 1 tablet by mouth 2 (two) times daily.  Dispense: 20 tablet; Refill: 0    I have reviewed the patient's medical history (PMH, PSH, Social History, Family History, Medications, and allergies) ,  and have been updated if relevant. I spent 30 minutes reviewing chart and  face to face time with patient.   Follow-up: Return for 4-6 weeks with Dr. Redmond Pulling.    Loraine Grip Mayers, PA-C

## 2021-04-22 NOTE — Patient Instructions (Addendum)
To help with your anxiety, I do encourage you to start taking the Zoloft on a daily basis, if it does make you sleepy, please move it to bedtime.  It may take 4 to 6 weeks before you start to notice any improvement, in the meantime I encourage you to use hydroxyzine 10 mg every 8 hours as needed to help reduce your anxiety levels.  I have started a referral for you to be seen by our clinic counselor as well.  I encourage you to take an enjoyable walk with your sweet [place] on a daily basis, and add extra vitamin D to your daily vitamin regimen, you should take 2000 international units once daily.  Make sure that you are using melatonin to help with sleep  To help with your dental abscess, I do encourage you to take Augmentin as directed.  Make sure that you are following up with dentistry as soon as you are able.  We will call you with today's lab results.  Roney Jaffe, PA-C Physician Assistant [facility] [email or web address]    Managing Anxiety, Adult After being diagnosed with anxiety, you may be relieved to know why you have felt or behaved a certain way. You may also feel overwhelmed about the treatment ahead and what it will mean for your life. With care and support, you can manage this condition. How to manage lifestyle changes Managing stress and anxiety Stress is your body's reaction to life changes and events, both good and bad. Most stress will last just a few hours, but stress can be ongoing and can lead to more than just stress. Although stress can play a major role in anxiety, it is not the same as anxiety. Stress is usually caused by something external, such as a deadline, test, or competition. Stress normally passes after the triggering event has ended.  Anxiety is caused by something internal, such as imagining a terrible outcome or worrying that something will go wrong that will devastate you. Anxiety often does not go  away even after the triggering event is over, and it can become long-term (chronic) worry. It is important to understand the differences between stress and anxiety and to manage your stress effectively so that it does not lead to an anxious response. Talk with your health care provider or a counselor to learn more about reducing anxiety and stress. He or she may suggest tension reduction techniques, such as: Music therapy. Spend time creating or listening to music that you enjoy and that inspires you. Mindfulness-based meditation. Practice being aware of your normal breaths while not trying to control your breathing. It can be done while sitting or walking. Centering prayer. This involves focusing on a word, phrase, or sacred image that means something to you and brings you peace. Deep breathing. To do this, expand your stomach and inhale slowly through your nose. Hold your breath for 3-5 seconds. Then exhale slowly, letting your stomach muscles relax. Self-talk. Learn to notice and identify thought patterns that lead to anxiety reactions and change those patterns to thoughts that feel peaceful. Muscle relaxation. Taking time to tense muscles and then relax them. Choose a tension reduction technique that fits your lifestyle and personality. These techniques take time and practice. Set aside 5-15 minutes a day to do them. Therapists can offer counseling and training in these techniques. The training to help with anxiety may be covered by some insurance plans. Other things you can do to manage stress and anxiety include: Keeping a  stress diary. This can help you learn what triggers your reaction and then learn ways to manage your response. Thinking about how you react to certain situations. You may not be able to control everything, but you can control your response. Making time for activities that help you relax and not feeling guilty about spending your time in this way. Doing visual imagery. This  involves imagining or creating mental pictures to help you relax. Practicing yoga. Through yoga poses, you can lower tension and promote relaxation.  Medicines Medicines can help ease symptoms. Medicines for anxiety include: Antidepressant medicines. These are usually prescribed for long-term daily control. Anti-anxiety medicines. These may be added in severe cases, especially when panic attacks occur. Medicines will be prescribed by a health care provider. When used together, medicines, psychotherapy, and tension reduction techniques may be the most effective treatment. Relationships Relationships can play a big part in helping you recover. Try to spend more time connecting with trusted friends and family members. Consider going to couples counseling if you have a partner, taking family education classes, or going to family therapy. Therapy can help you and others better understand your condition. How to recognize changes in your anxiety Everyone responds differently to treatment for anxiety. Recovery from anxiety happens when symptoms decrease and stop interfering with your daily activities at home or work. This may mean that you will start to: Have better concentration and focus. Worry will interfere less in your daily thinking. Sleep better. Be less irritable. Have more energy. Have improved memory. It is also important to recognize when your condition is getting worse. Contact your health care provider if your symptoms interfere with home or work and you feel like your condition is not improving. Follow these instructions at home: Activity Exercise. Adults should do the following: Exercise for at least 150 minutes each week. The exercise should increase your heart rate and make you sweat (moderate-intensity exercise). Strengthening exercises at least twice a week. Get the right amount and quality of sleep. Most adults need 7-9 hours of sleep each night. Lifestyle  Eat a healthy diet  that includes plenty of vegetables, fruits, whole grains, low-fat dairy products, and lean protein. Do not eat a lot of foods that are high in fats, added sugars, or salt (sodium). Make choices that simplify your life. Do not use any products that contain nicotine or tobacco. These products include cigarettes, chewing tobacco, and vaping devices, such as e-cigarettes. If you need help quitting, ask your health care provider. Avoid caffeine, alcohol, and certain over-the-counter cold medicines. These may make you feel worse. Ask your pharmacist which medicines to avoid. General instructions Take over-the-counter and prescription medicines only as told by your health care provider. Keep all follow-up visits. This is important. Where to find support You can get help and support from these sources: Self-help groups. Online and [employer]. A trusted spiritual leader. Couples counseling. Family education classes. Family therapy. Where to find more information You may find that joining a support group helps you deal with your anxiety. The following sources can help you locate counselors or support groups near you: Mental Health America: www.mentalhealthamerica.net Anxiety and Depression Association of [place] (ADAA): ProgramCam.de [employer] on Mental Illness (NAMI): www.nami.org Contact a health care provider if: You have a hard time staying focused or finishing daily tasks. You spend many hours a day feeling worried about everyday life. You become exhausted by worry. You start to have headaches or frequently feel tense. You develop chronic nausea or  diarrhea. Get help right away if: You have a racing heart and shortness of breath. You have thoughts of hurting yourself or others. If you ever feel like you may hurt yourself or others, or have thoughts about taking your own life, get help right away. Go to your nearest emergency department or: Call your local emergency  services (911 in the [place] Call a suicide crisis helpline, such as the National Suicide Prevention Lifeline at 5712498277 or 988 in the [place] This is open 24 hours a day in the [place] Text the Crisis Text Line at 434-684-6724 (in the [place] Summary Taking steps to learn and use tension reduction techniques can help calm you and help prevent triggering an anxiety reaction. When used together, medicines, psychotherapy, and tension reduction techniques may be the most effective treatment. Family, friends, and partners can play a big part in supporting you. This information is not intended to replace advice given to you by your health care provider. Make sure you discuss any questions you have with your health care provider. Document Revised: 09/04/2020 Document Reviewed: 06/02/2020 Elsevier Patient Education  2022 [employer].

## 2021-04-23 ENCOUNTER — Telehealth: Payer: Self-pay | Admitting: *Deleted

## 2021-04-23 DIAGNOSIS — F411 Generalized anxiety disorder: Secondary | ICD-10-CM | POA: Insufficient documentation

## 2021-04-23 LAB — COMP. METABOLIC PANEL (12)
AST: 24 IU/L (ref 0–40)
Albumin/Globulin Ratio: 1 — ABNORMAL LOW (ref 1.2–2.2)
Albumin: 4.3 g/dL (ref 3.8–4.8)
Alkaline Phosphatase: 39 IU/L — ABNORMAL LOW (ref 44–121)
BUN/Creatinine Ratio: 11 (ref 9–23)
BUN: 10 mg/dL (ref 6–20)
Bilirubin Total: 0.3 mg/dL (ref 0.0–1.2)
Calcium: 9.4 mg/dL (ref 8.7–10.2)
Chloride: 104 mmol/L (ref 96–106)
Creatinine, Ser: 0.94 mg/dL (ref 0.57–1.00)
Globulin, Total: 4.4 g/dL (ref 1.5–4.5)
Glucose: 84 mg/dL (ref 70–99)
Potassium: 3.6 mmol/L (ref 3.5–5.2)
Sodium: 137 mmol/L (ref 134–144)
Total Protein: 8.7 g/dL — ABNORMAL HIGH (ref 6.0–8.5)
eGFR: 83 mL/min/{1.73_m2} (ref >59)

## 2021-04-23 LAB — CBC WITH DIFFERENTIAL/PLATELET
Basophils Absolute: 0 10*3/uL (ref 0.0–0.2)
Basos: 0 %
EOS (ABSOLUTE): 0 10*3/uL (ref 0.0–0.4)
Eos: 1 %
Hematocrit: 35.4 % (ref 34.0–46.6)
Hemoglobin: 11.7 g/dL (ref 11.1–15.9)
Immature Grans (Abs): 0 10*3/uL (ref 0.0–0.1)
Immature Granulocytes: 0 %
Lymphocytes Absolute: 1.6 10*3/uL (ref 0.7–3.1)
Lymphs: 57 %
MCH: 29.8 pg (ref 26.6–33.0)
MCHC: 33.1 g/dL (ref 31.5–35.7)
MCV: 90 fL (ref 79–97)
Monocytes Absolute: 0.2 10*3/uL (ref 0.1–0.9)
Monocytes: 6 %
Neutrophils Absolute: 1 10*3/uL — ABNORMAL LOW (ref 1.4–7.0)
Neutrophils: 36 %
Platelets: 238 10*3/uL (ref 150–450)
RBC: 3.93 x10E6/uL (ref 3.77–5.28)
RDW: 12.6 % (ref 11.7–15.4)
WBC: 2.8 10*3/uL — ABNORMAL LOW (ref 3.4–10.8)

## 2021-04-23 LAB — TSH: TSH: 1.24 u[IU]/mL (ref 0.450–4.500)

## 2021-04-23 NOTE — Telephone Encounter (Signed)
-----   Message from Roney Jaffe, PA-C sent at 04/23/2021 11:46 AM EST ----- ?Please call patient and let her know that her thyroid function, kidney and liver function are within normal limits.  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  No further lab testing required at this time ?

## 2021-04-23 NOTE — Telephone Encounter (Signed)
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 ?

## 2021-05-02 ENCOUNTER — Other Ambulatory Visit: Payer: Self-pay

## 2021-05-02 ENCOUNTER — Ambulatory Visit (INDEPENDENT_AMBULATORY_CARE_PROVIDER_SITE_OTHER): Payer: 59

## 2021-05-02 DIAGNOSIS — Z3042 Encounter for surveillance of injectable contraceptive: Secondary | ICD-10-CM

## 2021-05-02 MED ORDER — MEDROXYPROGESTERONE ACETATE 150 MG/ML IM SUSP
150.0000 mg | Freq: Once | INTRAMUSCULAR | Status: AC
  Administered 2021-05-02: 150 mg via INTRAMUSCULAR

## 2021-05-02 NOTE — Progress Notes (Signed)
Patient came to get her depo shot. Patient tolerated it well. ?

## 2021-05-08 ENCOUNTER — Other Ambulatory Visit: Payer: Self-pay | Admitting: Family Medicine

## 2021-05-08 NOTE — Telephone Encounter (Signed)
Copied from CRM [id number]. Topic: Quick Communication - Rx Refill/Question ?>> May 08, 2021 10:09 AM Gaetana Michaelis A wrote: ?Medication: amoxicillin-clavulanate (AUGMENTIN) 875-125 MG tablet [[id number]]  ? ?Has the patient contacted their pharmacy? No. The patient was unable to afford the prescription previously  ?(Agent: If no, request that the patient contact the pharmacy for the refill. If patient does not wish to contact the pharmacy document the reason why and proceed with request.) ?(Agent: If yes, when and what did the pharmacy advise?) ? ?Preferred Pharmacy (with phone number or street name): [facility] [address], [place] - [address] ?[address] [place] [address] ?Phone: [phone] Fax: [phone] ?Hours: Not open 24 hours ? ?Has the patient been seen for an appointment in the last year OR does the patient have an upcoming appointment? Yes.   ? ?Agent: Please be advised that RX refills may take up to 3 business days. We ask that you follow-up with your pharmacy. ?

## 2021-05-08 NOTE — Telephone Encounter (Signed)
Requested medication (s) are due for refill today: no ? ?Requested medication (s) are on the active medication list: yes ? ?Last refill:  04/22/21 #20/0 ? ?Future visit scheduled: yes ? ?Notes to clinic:  Unable to refill per protocol, medication not assigned to the refill protocol. ? ? ?  ?Requested Prescriptions  ?Pending Prescriptions Disp Refills  ? amoxicillin-clavulanate (AUGMENTIN) 875-125 MG tablet 20 tablet 0  ?  Sig: Take 1 tablet by mouth 2 (two) times daily.  ?  ? Off-Protocol Failed - 05/08/2021 12:12 PM  ?  ?  Failed - Medication not assigned to a protocol, review manually.  ?  ?  Passed - Valid encounter within last 12 months  ?  Recent Outpatient Visits   ? ?      ? 2 weeks ago GAD (generalized anxiety disorder)  ? Primary Care at [facility], Cari S, PA-C  ? 2 months ago Vaginal irritation  ? Primary Care at [facility], Gildardo Pounds, NP  ? 4 months ago Bacterial vaginitis  ? Primary Care at [facility], Gildardo Pounds, NP  ? 5 months ago Vaginitis and vulvovaginitis  ? Primary Care at [facility], MD  ? 7 months ago Anxiety and depression  ? Primary Care at [facility], MD  ? ?  ?  ?Future Appointments   ? ?        ? In 1 month Georganna Skeans, MD Primary Care at [facility]  ? ?  ? ?  ?  ?  ? ?

## 2021-05-09 ENCOUNTER — Ambulatory Visit: Payer: 59

## 2021-05-19 NOTE — Telephone Encounter (Signed)
Pt called in wanting an update on the medication for her abscess, and requested if something can go ahead and be sent over, please advise.  ?

## 2021-05-23 ENCOUNTER — Telehealth: Payer: Self-pay | Admitting: Family Medicine

## 2021-05-23 NOTE — Telephone Encounter (Signed)
Copied from CRM [id number]. Topic: Quick Communication - Rx Refill/Question ?>> May 08, 2021 10:09 AM Gaetana Michaelis A wrote: ?Medication: amoxicillin-clavulanate (AUGMENTIN) 875-125 MG tablet [[id number]]  ? ?Has the patient contacted their pharmacy? No. The patient was unable to afford the prescription previously  ?(Agent: If no, request that the patient contact the pharmacy for the refill. If patient does not wish to contact the pharmacy document the reason why and proceed with request.) ?(Agent: If yes, when and what did the pharmacy advise?) ? ?Preferred Pharmacy (with phone number or street name): [facility] [address] [place] - [address] ?[address] [place] [address] ?Phone: [phone] Fax: [phone] ?Hours: Not open 24 hours ? ?Has the patient been seen for an appointment in the last year OR does the patient have an upcoming appointment? Yes.   ? ?Agent: Please be advised that RX refills may take up to 3 business days. We ask that you follow-up with your pharmacy. ?>> May 23, 2021 12:52 PM Marylen Ponto wrote: ?Pt stated she has been requesting a Rx for amoxicillin for a few weeks now but has yet to receive a call back. Pt requests call back  ?

## 2021-05-28 NOTE — Telephone Encounter (Signed)
[facility] confirmed with [facility] that the script was placed on hold, Patient will report to Pharmacy for pickup today.

## 2021-05-31 ENCOUNTER — Emergency Department (HOSPITAL_BASED_OUTPATIENT_CLINIC_OR_DEPARTMENT_OTHER)
Admission: EM | Admit: 2021-05-31 | Discharge: 2021-05-31 | Disposition: A | Discharge status: Home / Self Care | Payer: 59 | Attending: Emergency Medicine | Admitting: Emergency Medicine

## 2021-05-31 ENCOUNTER — Encounter (HOSPITAL_BASED_OUTPATIENT_CLINIC_OR_DEPARTMENT_OTHER): Payer: Self-pay

## 2021-05-31 ENCOUNTER — Emergency Department (HOSPITAL_BASED_OUTPATIENT_CLINIC_OR_DEPARTMENT_OTHER): Payer: 59

## 2021-05-31 ENCOUNTER — Other Ambulatory Visit: Payer: Self-pay

## 2021-05-31 DIAGNOSIS — E876 Hypokalemia: Secondary | ICD-10-CM | POA: Insufficient documentation

## 2021-05-31 DIAGNOSIS — M940 Chondrocostal junction syndrome [Tietze]: Secondary | ICD-10-CM | POA: Diagnosis not present

## 2021-05-31 DIAGNOSIS — R072 Precordial pain: Secondary | ICD-10-CM | POA: Diagnosis present

## 2021-05-31 HISTORY — DX: Iron deficiency anemia, unspecified: D50.9

## 2021-05-31 LAB — BASIC METABOLIC PANEL
Anion gap: 4 — ABNORMAL LOW (ref 5–15)
BUN: 7 mg/dL (ref 6–20)
CO2: 25 mmol/L (ref 22–32)
Calcium: 9 mg/dL (ref 8.9–10.3)
Chloride: 108 mmol/L (ref 98–111)
Creatinine, Ser: 1.05 mg/dL — ABNORMAL HIGH (ref 0.44–1.00)
GFR, Estimated: >60 mL/min (ref >60)
Glucose, Bld: 121 mg/dL — ABNORMAL HIGH (ref 70–99)
Potassium: 3.1 mmol/L — ABNORMAL LOW (ref 3.5–5.1)
Sodium: 137 mmol/L (ref 135–145)

## 2021-05-31 LAB — CBC
HCT: 31.8 % — ABNORMAL LOW (ref 36.0–46.0)
Hemoglobin: 11.3 g/dL — ABNORMAL LOW (ref 12.0–15.0)
MCH: 30.9 pg (ref 26.0–34.0)
MCHC: 35.5 g/dL (ref 30.0–36.0)
MCV: 86.9 fL (ref 80.0–100.0)
Platelets: 239 10*3/uL (ref 150–400)
RBC: 3.66 MIL/uL — ABNORMAL LOW (ref 3.87–5.11)
RDW: 12.3 % (ref 11.5–15.5)
WBC: 2.5 10*3/uL — ABNORMAL LOW (ref 4.0–10.5)
nRBC: 0 % (ref 0.0–0.2)

## 2021-05-31 LAB — TROPONIN I (HIGH SENSITIVITY): Troponin I (High Sensitivity): <2 ng/L (ref <18)

## 2021-05-31 LAB — PREGNANCY, URINE: Preg Test, Ur: NEGATIVE

## 2021-05-31 MED ORDER — POTASSIUM CHLORIDE CRYS ER 20 MEQ PO TBCR
40.0000 meq | EXTENDED_RELEASE_TABLET | Freq: Once | ORAL | Status: AC
  Administered 2021-05-31: 40 meq via ORAL
  Filled 2021-05-31: qty 2

## 2021-05-31 MED ORDER — NAPROXEN 250 MG PO TABS
500.0000 mg | ORAL_TABLET | Freq: Once | ORAL | Status: AC
  Administered 2021-05-31: 500 mg via ORAL
  Filled 2021-05-31: qty 2

## 2021-05-31 MED ORDER — LACTATED RINGERS IV BOLUS
1000.0000 mL | Freq: Once | INTRAVENOUS | Status: AC
  Administered 2021-05-31: 1000 mL via INTRAVENOUS

## 2021-05-31 NOTE — ED Provider Notes (Signed)
?[facility] EMERGENCY DEPARTMENT ?Provider Note ? ? ?CSN: [id number] ?Arrival date & time: 05/31/21  1708 ? ?  ? ?History ? ?Chief Complaint  ?Patient presents with  ?? Chest Pain  ? ? ?Faith Ross is a 31 y.o. female with PMH significant for anxiety and previous episode of costochondritis presents to the ED for evaluation of substernal chest pain, just to the right of the sternum that started yesterday.  Pain has been constant since then and fluctuating in severity.  She describes it as sharp/aching.  Worse with movement, palpation and deep breaths.  She is taken 200 mg ibuprofen at home without improvement in symptoms.  Patient denies leg swelling, leg pain, back pain, numbness, tingling, abdominal pain, nausea, vomiting diarrhea.  She has no previous history of cardiac disease or diabetes.  No previous history of blood clots. ? ? ?Chest Pain ? ?  ? ?Home Medications ?Prior to Admission medications   ?Medication Sig Start Date End Date Taking? Authorizing Provider  ?amoxicillin-clavulanate (AUGMENTIN) 875-125 MG tablet Take 1 tablet by mouth 2 (two) times daily. 04/22/21   Mayers, Cari S, PA-C  ?cyclobenzaprine (FLEXERIL) 10 MG tablet Take 1 tablet (10 mg total) by mouth at bedtime as needed for muscle spasms. 01/28/21   Placido Sou, PA-C  ?hydrOXYzine (ATARAX) 10 MG tablet Take 1 tablet (10 mg total) by mouth 3 (three) times daily as needed. 04/22/21   Mayers, Cari S, PA-C  ?medroxyPROGESTERone (DEPO-PROVERA) 150 MG/ML injection See admin instructions.    [provider]  ?naproxen (NAPROSYN) 500 MG tablet Take 1 tablet (500 mg total) by mouth 2 (two) times daily as needed. 01/28/21   Placido Sou, PA-C  ?sertraline (ZOLOFT) 25 MG tablet Take 1 tablet (25 mg total) by mouth daily. 04/22/21   Mayers, Kasandra Knudsen, PA-C  ?   ? ?Allergies    ?Patient has no known allergies.   ? ?Review of Systems   ?Review of Systems  ?Cardiovascular:  Positive for chest pain.  ? ?Physical Exam ?Updated Vital  Signs ?BP 120/70   Pulse 92   Temp 97.8 ?F (36.6 ?C) (Oral)   Resp 15   Ht 5\' 6"  (1.676 m)   Wt 56.7 kg   SpO2 100%   BMI 20.18 kg/m?  ?Physical Exam ?Vitals and nursing note reviewed.  ?Constitutional:   ?   General: She is not in acute distress. ?   Appearance: She is not ill-appearing.  ?HENT:  ?   Head: Atraumatic.  ?Eyes:  ?   Conjunctiva/sclera: Conjunctivae normal.  ?Cardiovascular:  ?   Rate and Rhythm: Normal rate and regular rhythm.  ?   Pulses: Normal pulses.  ?   Heart sounds: No murmur heard. ?Pulmonary:  ?   Effort: Pulmonary effort is normal. No respiratory distress.  ?   Breath sounds: Normal breath sounds.  ?   Comments: Lung clear to ausculation bilaterally. No tachypnea, no accessory muscle use, no acute distress, no increased work of breathing, no decrease in air movement ? ?Chest:  ? ? ?   Comments: Reproducible chest wall tenderness just right of the sternum.  No crepitus, bruising, or palpable deformities. ?Abdominal:  ?   General: Abdomen is flat. There is no distension.  ?   Palpations: Abdomen is soft.  ?   Tenderness: There is no abdominal tenderness.  ?Musculoskeletal:     ?   General: Normal range of motion.  ?   Cervical back: Normal range of motion.  ?Skin: ?  General: Skin is warm and dry.  ?   Capillary Refill: Capillary refill takes less than 2 seconds.  ?Neurological:  ?   General: No focal deficit present.  ?   Mental Status: She is alert.  ?Psychiatric:     ?   Mood and Affect: Mood normal.  ? ? ?ED Results / Procedures / Treatments   ?Labs ?(all labs ordered are listed, but only abnormal results are displayed) ?Labs Reviewed  ?BASIC METABOLIC PANEL - Abnormal; Notable for the following components:  ?    Result Value  ? Potassium 3.1 (*)   ? Glucose, Bld 121 (*)   ? Creatinine, Ser 1.05 (*)   ? Anion gap 4 (*)   ? All other components within normal limits  ?CBC - Abnormal; Notable for the following components:  ? WBC 2.5 (*)   ? RBC 3.66 (*)   ? Hemoglobin 11.3 (*)   ?  HCT 31.8 (*)   ? All other components within normal limits  ?PREGNANCY, URINE  ?TROPONIN I (HIGH SENSITIVITY)  ? ? ?EKG ?EKG Interpretation ? ?Date/Time:  Saturday May 31 2021 18:32:55 EDT ?Ventricular Rate:  104 ?PR Interval:  143 ?QRS Duration: 90 ?QT Interval:  339 ?QTC Calculation: 446 ?R Axis:   71 ?Text Interpretation: Sinus tachycardia increased rate from prior today Confirmed by Meridee ScoreButler, Michael [phone]([id number]) on 05/31/2021 6:37:24 PM ? ?Radiology ?DG Chest 2 View ? ?Result Date: 05/31/2021 ?CLINICAL DATA:  chest pain/sob EXAM: CHEST - 2 VIEW COMPARISON:  Chest radiograph dated September 30, 2017, March 29, 2020 FINDINGS: The cardiomediastinal silhouette is normal in contour. No pleural effusion. No pneumothorax. No acute pleuroparenchymal abnormality. Visualized abdomen is unremarkable. No acute osseous abnormality noted. IMPRESSION: No acute cardiopulmonary abnormality. Electronically Signed   By: Meda KlinefelterStephanie  Peacock M.D.   On: 05/31/2021 18:09   ? ?Procedures ?Procedures  ? ? ?Medications Ordered in ED ?Medications  ?potassium chloride SA (KLOR-CON M) CR tablet 40 mEq (40 mEq Oral Given 05/31/21 1901)  ?naproxen (NAPROSYN) tablet 500 mg (500 mg Oral Given 05/31/21 1902)  ?lactated ringers bolus 1,000 mL (1,000 mLs Intravenous New Bag/Given 05/31/21 1906)  ? ? ?ED Course/ Medical Decision Making/ A&P ?  ?                        ?Medical Decision Making ?Amount and/or Complexity of Data Reviewed ?Labs: ordered. ?Radiology: ordered. ? ?Risk ?Prescription drug management. ? ? ?History:  ?Per HPI ?Social determinants of health: None ? ?Initial impression: ? ?This patient presents to the ED for concern of chest pain, this involves an extensive number of treatment options, and is a complaint that carries with it a high risk of complications and morbidity.   Emergent differentials include ACS versus costochondritis versus PE versus pneumonia ? ? ?Lab Tests and EKG: ? ?I Ordered, reviewed, and interpreted labs and EKG.  The  pertinent results include:  ?Troponin normal, pregnancy test normal ?CBC without acute findings ?BMP with hypokalemia mildly increased creatinine ?EKG with NSR ? ? ?Imaging Studies ordered: ? ?I ordered imaging studies including  ?Chest x-ray normal ?I independently visualized and interpreted imaging and I agree with the radiologist interpretation.  ? ? ?Cardiac Monitoring: ? ?The patient was maintained on a cardiac monitor.  I personally viewed and interpreted the cardiac monitored which showed an underlying rhythm of: NSR ? ? ?Medicines ordered and prescription drug management: ? ?I ordered medication including: ?Naproxen 500 mg ?Potassium 40 mEq ?LR bolus  1 L ?Reevaluation of the patient after these medicines showed that the patient improved ?I have reviewed the patients home medicines and have made adjustments as needed ? ?ED Course: ?31 year old female, nontoxic appearing.  She is tearful and crying at the start of my evaluation.  Physical exam with focal reproducible tenderness just to the right of the lower sternum.  Remainder physical exam otherwise benign.  Pulmonary exam normal.  Chest x-ray normal.  Labs without acute findings, although does indicate mild dehydration.  1 L bolus of fluids given here in the ED along with the mEq potassium for hypokalemia.  I considered obtaining a D-dimer, however patient does not have tachycardia or leg pain/swelling.  Given the reproducible nature, concern for ACS, pneumonia or PE is quite low.  Also given that patient has history of costochondritis and notes that symptoms seem similar today, her chest pain is likely due to another episode of costochondritis.  Naproxen given for this likely etiology. ? ?Disposition: ? ?After consideration of the diagnostic results, physical exam, history and the patients response to treatment feel that the patent would benefit from discharge.   ?Costochondritis: Plan as described above.  Strict return precautions were discussed.  Patient  can take NSAID of her choice at home for flareup of costochondritis.  All questions were asked and answered and she was discharged home in good condition. ? ?Final Clinical Impression(s) / ED Diagnoses ?Final diagnoses:

## 2021-05-31 NOTE — ED Notes (Signed)
Pt brought to room crying and holding chest.  Assisted to bed and EKG obtained. Reports pain worse with breathing. Tech worked with patient to help relax her. ?

## 2021-05-31 NOTE — Discharge Instructions (Signed)
Your symptoms are likely due to another episode of costochondritis.  This should resolve on its own in a couple weeks.  Continue to take ibuprofen or naproxen as needed to help with your pain, and please drink plenty of water as well.  If your pain does not improve or seems to worsen, you can return to the emergency department.  Please follow-up with your primary care doctor as needed. ?

## 2021-05-31 NOTE — ED Triage Notes (Addendum)
Started having right sided chest pain since yesterday. States pain with deep breath and shortness of breath. On birth control injection ?

## 2021-06-03 ENCOUNTER — Emergency Department (HOSPITAL_BASED_OUTPATIENT_CLINIC_OR_DEPARTMENT_OTHER)
Admission: EM | Admit: 2021-06-03 | Discharge: 2021-06-03 | Disposition: A | Discharge status: Home / Self Care | Payer: 59 | Attending: Emergency Medicine | Admitting: Emergency Medicine

## 2021-06-03 ENCOUNTER — Other Ambulatory Visit: Payer: Self-pay

## 2021-06-03 ENCOUNTER — Emergency Department (HOSPITAL_BASED_OUTPATIENT_CLINIC_OR_DEPARTMENT_OTHER): Payer: 59

## 2021-06-03 ENCOUNTER — Encounter (HOSPITAL_BASED_OUTPATIENT_CLINIC_OR_DEPARTMENT_OTHER): Payer: Self-pay

## 2021-06-03 DIAGNOSIS — R079 Chest pain, unspecified: Secondary | ICD-10-CM | POA: Diagnosis present

## 2021-06-03 DIAGNOSIS — M940 Chondrocostal junction syndrome [Tietze]: Secondary | ICD-10-CM | POA: Diagnosis not present

## 2021-06-03 LAB — BASIC METABOLIC PANEL
Anion gap: 7 (ref 5–15)
BUN: 9 mg/dL (ref 6–20)
CO2: 22 mmol/L (ref 22–32)
Calcium: 9.3 mg/dL (ref 8.9–10.3)
Chloride: 109 mmol/L (ref 98–111)
Creatinine, Ser: 0.96 mg/dL (ref 0.44–1.00)
GFR, Estimated: >60 mL/min (ref >60)
Glucose, Bld: 93 mg/dL (ref 70–99)
Potassium: 3.5 mmol/L (ref 3.5–5.1)
Sodium: 138 mmol/L (ref 135–145)

## 2021-06-03 LAB — CBC
HCT: 31.2 % — ABNORMAL LOW (ref 36.0–46.0)
Hemoglobin: 11.1 g/dL — ABNORMAL LOW (ref 12.0–15.0)
MCH: 30.6 pg (ref 26.0–34.0)
MCHC: 35.6 g/dL (ref 30.0–36.0)
MCV: 86 fL (ref 80.0–100.0)
Platelets: 220 10*3/uL (ref 150–400)
RBC: 3.63 MIL/uL — ABNORMAL LOW (ref 3.87–5.11)
RDW: 12.2 % (ref 11.5–15.5)
WBC: 4 10*3/uL (ref 4.0–10.5)
nRBC: 0 % (ref 0.0–0.2)

## 2021-06-03 LAB — TROPONIN I (HIGH SENSITIVITY): Troponin I (High Sensitivity): <2 ng/L (ref <18)

## 2021-06-03 MED ORDER — NAPROXEN 500 MG PO TABS: 500.0000 mg | ORAL_TABLET | Freq: Two times a day (BID) | ORAL | 0 refills | Status: DC

## 2021-06-03 MED ORDER — KETOROLAC TROMETHAMINE 30 MG/ML IJ SOLN
30.0000 mg | Freq: Once | INTRAMUSCULAR | Status: AC
  Administered 2021-06-03: 30 mg via INTRAVENOUS
  Filled 2021-06-03: qty 1

## 2021-06-03 NOTE — ED Provider Notes (Signed)
?[facility] EMERGENCY DEPARTMENT ?Provider Note ? ? ?CSN: [id number] ?Arrival date & time: 06/03/21  1805 ? ?  ? ?History ? ?Chief Complaint  ?Patient presents with  ? Chest Pain  ? ? ?Faith Ross is a 31 y.o. female. ? ? ?Chest Pain ? ?Patient with medical history notable for anxiety, costochondritis presents today with chest pain.  She was seen a few days ago in the ED diagnosed with costochondritis, discharged with 400 mg of ibuprofen twice daily.  She states this right taking that the pain has been worsening.  The pain started on Saturday, its been intermittent.  It feels sharp and it is provoked by pressure and by movement.  Laying down flat and leaning forward both make the pain worse.  Is mostly on the right side, denies any injury to it.  Associated with shortness of breath, no vomiting.  Not on any oral birth control, no history of PE. ? ?Home Medications ?Prior to Admission medications   ?Medication Sig Start Date End Date Taking? Authorizing Provider  ?naproxen (NAPROSYN) 500 MG tablet Take 1 tablet (500 mg total) by mouth 2 (two) times daily. 06/03/21  Yes Theron Arista, PA-C  ?amoxicillin-clavulanate (AUGMENTIN) 875-125 MG tablet Take 1 tablet by mouth 2 (two) times daily. 04/22/21   Mayers, Cari S, PA-C  ?cyclobenzaprine (FLEXERIL) 10 MG tablet Take 1 tablet (10 mg total) by mouth at bedtime as needed for muscle spasms. 01/28/21   Placido Sou, PA-C  ?hydrOXYzine (ATARAX) 10 MG tablet Take 1 tablet (10 mg total) by mouth 3 (three) times daily as needed. 04/22/21   Mayers, Cari S, PA-C  ?medroxyPROGESTERone (DEPO-PROVERA) 150 MG/ML injection See admin instructions.    [provider]  ?sertraline (ZOLOFT) 25 MG tablet Take 1 tablet (25 mg total) by mouth daily. 04/22/21   Mayers, Kasandra Knudsen, PA-C  ?   ? ?Allergies    ?Toradol [ketorolac tromethamine]   ? ?Review of Systems   ?Review of Systems  ?Cardiovascular:  Positive for chest pain.  ? ?Physical Exam ?Updated Vital Signs ?BP 123/87    Pulse 94   Temp 98.3 ?F (36.8 ?C) (Oral)   Resp 19   Ht 5\' 6"  (1.676 m)   Wt 56.7 kg   SpO2 100%   BMI 20.18 kg/m?  ?Physical Exam ?Vitals and nursing note reviewed. Exam conducted with a chaperone present.  ?Constitutional:   ?   Appearance: Normal appearance.  ?HENT:  ?   Head: Normocephalic and atraumatic.  ?Eyes:  ?   General: No scleral icterus.    ?   Right eye: No discharge.     ?   Left eye: No discharge.  ?   Extraocular Movements: Extraocular movements intact.  ?   Pupils: Pupils are equal, round, and reactive to light.  ?Cardiovascular:  ?   Rate and Rhythm: Normal rate and regular rhythm.  ?   Pulses: Normal pulses.  ?   Heart sounds: Normal heart sounds. No murmur heard. ?  No friction rub. No gallop.  ?Pulmonary:  ?   Effort: Pulmonary effort is normal. No respiratory distress.  ?   Breath sounds: Normal breath sounds.  ?Chest:  ?   Chest wall: Tenderness present.  ?   Comments: Pinpoint reproducible chest tenderness ?Abdominal:  ?   General: Abdomen is flat. Bowel sounds are normal. There is no distension.  ?   Palpations: Abdomen is soft.  ?   Tenderness: There is no abdominal tenderness.  ?Skin: ?  General: Skin is warm and dry.  ?   Coloration: Skin is not jaundiced.  ?Neurological:  ?   Mental Status: She is alert. Mental status is at baseline.  ?   Coordination: Coordination normal.  ? ? ?ED Results / Procedures / Treatments   ?Labs ?(all labs ordered are listed, but only abnormal results are displayed) ?Labs Reviewed  ?CBC - Abnormal; Notable for the following components:  ?    Result Value  ? RBC 3.63 (*)   ? Hemoglobin 11.1 (*)   ? HCT 31.2 (*)   ? All other components within normal limits  ?BASIC METABOLIC PANEL  ?TROPONIN I (HIGH SENSITIVITY)  ? ? ?EKG ?None ? ?Radiology ?DG Chest Port 1 View ? ?Result Date: 06/03/2021 ?CLINICAL DATA:  Chest pain EXAM: PORTABLE CHEST 1 VIEW COMPARISON:  05/31/2021 FINDINGS: The heart size and mediastinal contours are within normal limits. Both lungs  are clear. The visualized skeletal structures are unremarkable. IMPRESSION: No active disease. Electronically Signed   By: Alcide CleverMark  Lukens M.D.   On: 06/03/2021 21:26   ? ?Procedures ?Procedures  ? ? ?Medications Ordered in ED ?Medications  ?ketorolac (TORADOL) 30 MG/ML injection 30 mg (30 mg Intravenous Given 06/03/21 2220)  ? ? ?ED Course/ Medical Decision Making/ A&P ?  ?                        ?Medical Decision Making ?Amount and/or Complexity of Data Reviewed ?Labs: ordered. ?Radiology: ordered. ? ?Risk ?Prescription drug management. ? ? ?This patient presents to the ED for concern of chest pain, this involves an extensive number of treatment options, and is a complaint that carries with it a high risk of complications and morbidity.  The differential diagnosis includes ACS, PE, costochondritis, pneumonia ? ? ?Additional history obtained:  ? ?Independent historian: cousin ? ?Reviewed work-up from ED visit earlier this week.  Diagnosed with costochondritis, negative Trope. ? ?  ?Lab Tests: ? ?I ordered, viewed, and personally interpreted labs.  The pertinent results include: No gross electrolyte derangement, no leukocytosis or anemia.  Troponin is negative. ? ?  ?Imaging Studies ordered: ? ?I directly visualized the Chest x-ray, which showed no acute process ? ?I agree with the radiologist interpretation ?  ? ?ECG/Cardiac monitoring:  ? ?Per my interpretation, EKG shows NSR ? ?The patient was maintained on a cardiac monitor.  Visualized monitor strip which showed NSR per my interpretation.  ? ? ?Medicines ordered and prescription drug management: ? ?I ordered medication including: toradol   ? ?I have reviewed the patients home medicines and have made adjustments as needed ? ? ?Test Considered: ? ?Considered CTA but given she is not hypoxic, not tachycardic and not on oral birth control with risk factors I do not think this is a PE.  Considered ACS but negative strip noted no ischemic findings and low heart score so  I think that is unlikely as well. ? ? ?Reevaluation: ? ?After the interventions noted above, I reevaluated the patient and found pain improved ? ? ?Problems addressed / ED Course: ?Patient presents with chest pain.  Given his pinpoint reproducible I think it is more likely costochondritis.  Will send home with naproxen, respecters discussed.  Do not feel any additional work-up is needed at this time. ?  ?Social Determinants of Health: ? ?  ?Disposition: ? ? ?After consideration of the diagnostic results and the patients response to treatment, I feel that the patent would benefit from D/C. ? ?  ? ? ? ? ? ? ? ? ?  Final Clinical Impression(s) / ED Diagnoses ?Final diagnoses:  ?Costochondritis  ? ? ?Rx / DC Orders ?ED Discharge Orders   ? ?      Ordered  ?  naproxen (NAPROSYN) 500 MG tablet  2 times daily       ? 06/03/21 2209  ? ?  ?  ? ?  ? ? ?  ?Theron Arista, PA-C ?06/04/21 0004 ? ?  ?Sloan Leiter, DO ?06/05/21 1746 ? ?

## 2021-06-03 NOTE — Discharge Instructions (Signed)
Take the naproxen twice daily with food and water ?Follow up with PCP this week recheck ?

## 2021-06-03 NOTE — ED Triage Notes (Addendum)
C/o chest pain since this weekend. Dx with costochondritis. States been taking ibuprofen without relief. BIB EMS for recheck. States pain will improve then start again. ?

## 2021-06-04 ENCOUNTER — Ambulatory Visit: Payer: Self-pay | Admitting: *Deleted

## 2021-06-04 NOTE — Telephone Encounter (Signed)
?  Chief Complaint: Chest pain ?Symptoms: Pain at collar bone area, both sides, sharp, intermittent. Does heavy lifting at work. Seen in ED yesterday "Costochondritis" HAs not applied ice/ heat as of yet  as directed in ED. ?Frequency: Last Friday ?Pertinent Negatives: Patient denies  ?Disposition: [] ED /[] Urgent Care (no appt availability in office) / [] Appointment(In office/virtual)/ []  [place] [facility]/ [] Home Care/ [] Refused Recommended Disposition /[] [place] [facility]/ [x]  Follow-up with PCP ?Additional Notes: Pt states "I just want a second opinion to make sure nothing else wrong." Assured pt NT would route to practice for PCPs review and final disposition. Advised return to ED for worsening symptoms. Verbalizes understanding.  ?Please advise. ?Reason for Disposition ? Chest pain(s) lasting a few seconds ?   Seen in ED yesterday "Want a second opinion" ? ?Answer Assessment - Initial Assessment Questions ?1. LOCATION: "Where does it hurt?"   ?    Top of chest, at collar bones ?2. RADIATION: "Does the pain go anywhere else?" (e.g., into neck, jaw, arms, back) ?    Today, back pain ?3. ONSET: "When did the chest pain begin?" (Minutes, hours or days)  ?    Last Friday ?4. PATTERN "Does the pain come and go, or has it been constant since it started?"  "Does it get worse with exertion?"  ?    Intermittent ?5. DURATION: "How long does it last" (e.g., seconds, minutes, hours) ?    Last 30 minutes or so, bending over hurts worse ?6. SEVERITY: "How bad is the pain?"  (e.g., Scale 1-10; mild, moderate, or severe) ?   - MILD (1-3): doesn't interfere with normal activities  ?   - MODERATE (4-7): interferes with normal activities or awakens from sleep ?   - SEVERE (8-10): excruciating pain, unable to do any normal activities   ?   10/10 when occurs "Sharp" ?7. CARDIAC RISK FACTORS: "Do you have any history of heart problems or risk factors for heart disease?" (e.g., angina, prior heart attack; diabetes, high  blood pressure, high cholesterol, smoker, or strong family history of heart disease) ?     ?8. PULMONARY RISK FACTORS: "Do you have any history of lung disease?"  (e.g., blood clots in lung, asthma, emphysema, birth control pills) ?     ?9. CAUSE: "What do you think is causing the chest pain?" ?    Heavy lifting on job ?10. OTHER SYMPTOMS: "Do you have any other symptoms?" (e.g., dizziness, nausea, vomiting, sweating, fever, difficulty breathing, cough) ?      SOB at times "Later on after occurs" Area is tender to touch. ? ?Protocols used: Chest Pain-A-AH ? ?

## 2021-06-09 ENCOUNTER — Telehealth: Payer: Self-pay | Admitting: Family Medicine

## 2021-06-09 NOTE — Telephone Encounter (Signed)
Returned pt call to sched appt no answer. Lvm to call back to sched appt  ?

## 2021-06-13 ENCOUNTER — Ambulatory Visit: Payer: 59 | Admitting: Family Medicine

## 2021-06-30 ENCOUNTER — Ambulatory Visit: Payer: Self-pay | Admitting: *Deleted

## 2021-06-30 NOTE — Telephone Encounter (Signed)
?  Chief Complaint: allergies ?Symptoms: nasal and eye allergies, eyes irritated and red, itchy, congestion ?Frequency: several days ?Pertinent Negatives: NA ?Disposition: [] ED /[] Urgent Care (no appt availability in office) / [x] Appointment(In office/virtual)/ []  [place] [facility]/ [] Home Care/ [] Refused Recommended Disposition /[] [place] [facility]/ []  Follow-up with PCP ?Additional Notes: pt states she has been using the erythromycin ointment for her eyes and it has helped but she lost the tube. It was prescribed last year in May so advised her to schedule appt. Scheduled virtual appt for tomorrow at 1440 with Dr. . Pt is going to use OTC eye drops and try some benadryl for the night.  ? ?Summary: Allergies, no appt available  ? Pt has sinus infection symptoms, she is seeking an appt and the next is not available until the end of the month. Pt says she has irritated eyes and congestion   ?  ? ? ?Reason for Disposition ? [1] Taking antihistamines > 2 days AND [2] nasal allergy symptoms interfere with sleep, school, or work ? ?Answer Assessment - Initial Assessment Questions ?1. SYMPTOM: "What's the main symptom you're concerned about?" (e.g., runny nose, stuffiness, sneezing, itching) ?    congestion ?3. EYES: "Are the eyes also red, watery, and itchy?"  ?    yes ?4. TRIGGER: "What pollen or other allergic substance do you think is causing the symptoms?"  ?    Pollen  ?5. TREATMENT: "What medicine are you using?" "What medicine worked best in the past?" ?    Erythromycin, flonase ?6. OTHER SYMPTOMS: "Do you have any other symptoms?" (e.g., coughing, difficulty breathing, wheezing) ?    Difficulty breathing at night d/t  congestion ? ?Protocols used: Nasal Allergies (Hay Fever)-A-AH ? ?

## 2021-06-30 NOTE — Telephone Encounter (Signed)
Attempted to reach pt.,extended ring. Unable to leave message to call back. ?

## 2021-07-01 ENCOUNTER — Telehealth (INDEPENDENT_AMBULATORY_CARE_PROVIDER_SITE_OTHER): Payer: 59 | Admitting: Family Medicine

## 2021-07-01 ENCOUNTER — Encounter: Payer: Self-pay | Admitting: Family Medicine

## 2021-07-01 DIAGNOSIS — H109 Unspecified conjunctivitis: Secondary | ICD-10-CM | POA: Diagnosis not present

## 2021-07-01 NOTE — Progress Notes (Signed)
Patient c/o having allergy really bad and needing something that will help her.  ? ?Patient eyes are running and nose  stuffy. ?

## 2021-07-01 NOTE — Progress Notes (Signed)
Virtual Visit via Telephone Note ? ?I connected with Faith Ross on 07/01/21 at  2:40 PM EDT by telephone and verified that I am speaking with the correct person using two identifiers. ? ?Location: ?Patient: work - [employer] ?Provider: office ?  ?I discussed the limitations, risks, security and privacy concerns of performing an evaluation and management service by telephone and the availability of in person appointments. I also discussed with the patient that there may be a patient responsible charge related to this service. The patient expressed understanding and agreed to proceed. ? ? ?History of Present Illness: ?Patient reports recurrent conjunctivitis due to allergies and wants a refill of erythromycin ophth ointment ?  ?Observations/Objective: ? ? ?Assessment and Plan: ? ?1. Conjunctivitis, unspecified conjunctivitis type, unspecified laterality ?Patient deferred pataday and zyrtec as she said that she already tried these agents.She offered to be scheduled for an in person OV for further eval/mgt ? ?Follow Up Instructions: ? ?  ?I discussed the assessment and treatment plan with the patient. The patient was provided an opportunity to ask questions and all were answered. The patient agreed with the plan and demonstrated an understanding of the instructions. ?  ?The patient was advised to call back or seek an in-person evaluation if the symptoms worsen or if the condition fails to improve as anticipated. ? ?I provided 5 minutes of non-face-to-face time during this encounter. ? ? ?Tommie Raymond, MD  ?

## 2021-07-03 ENCOUNTER — Ambulatory Visit (INDEPENDENT_AMBULATORY_CARE_PROVIDER_SITE_OTHER): Payer: 59 | Admitting: Family Medicine

## 2021-07-03 VITALS — BP 128/73 | HR 81 | Temp 98.1°F | Resp 16 | Wt 118.6 lb

## 2021-07-03 DIAGNOSIS — H109 Unspecified conjunctivitis: Secondary | ICD-10-CM | POA: Diagnosis not present

## 2021-07-03 DIAGNOSIS — J302 Other seasonal allergic rhinitis: Secondary | ICD-10-CM | POA: Diagnosis not present

## 2021-07-03 MED ORDER — MONTELUKAST SODIUM 10 MG PO TABS: 10.0000 mg | ORAL_TABLET | Freq: Every day | ORAL | 1 refills | Status: AC

## 2021-07-03 MED ORDER — ERYTHROMYCIN 5 MG/GM OP OINT: 1.0000 "application " | TOPICAL_OINTMENT | Freq: Every day | OPHTHALMIC | 0 refills | Status: DC

## 2021-07-03 NOTE — Progress Notes (Signed)
Patient is here to see provider about her conjunctivitis. Patient would like to talk with provider about her issues. ?

## 2021-07-04 ENCOUNTER — Encounter: Payer: Self-pay | Admitting: Family Medicine

## 2021-07-04 NOTE — Progress Notes (Signed)
? ?Established Patient Office Visit ? ?Subjective   ? ?Patient ID: Faith Ross, female    DOB: [birth year]  Age: 31 y.o. MRN: [id number] ? ?CC:  ?Chief Complaint  ?Patient presents with  ? Eye Problem  ? ? ?HPI ?Faith Ross presents for seasonal symptoms including conjunctivitis and rhinitis. Patient denies fever/chills or viral sx.  ? ? ?Outpatient Encounter Medications as of 07/03/2021  ?Medication Sig  ? erythromycin ophthalmic ointment Place 1 application. into both eyes at bedtime.  ? montelukast (SINGULAIR) 10 MG tablet Take 1 tablet (10 mg total) by mouth at bedtime.  ? amoxicillin-clavulanate (AUGMENTIN) 875-125 MG tablet Take 1 tablet by mouth 2 (two) times daily.  ? cyclobenzaprine (FLEXERIL) 10 MG tablet Take 1 tablet (10 mg total) by mouth at bedtime as needed for muscle spasms.  ? hydrOXYzine (ATARAX) 10 MG tablet Take 1 tablet (10 mg total) by mouth 3 (three) times daily as needed.  ? medroxyPROGESTERone (DEPO-PROVERA) 150 MG/ML injection See admin instructions.  ? naproxen (NAPROSYN) 500 MG tablet Take 1 tablet (500 mg total) by mouth 2 (two) times daily.  ? sertraline (ZOLOFT) 25 MG tablet Take 1 tablet (25 mg total) by mouth daily.  ? ?No facility-administered encounter medications on file as of 07/03/2021.  ? ? ?Past Medical History:  ?Diagnosis Date  ? Alopecia   ? Anxiety   ? Chest wall pain   ? Iron deficiency anemia   ? ? ?Past Surgical History:  ?Procedure Laterality Date  ? TONSILLECTOMY    ? ? ?Family History  ?Problem Relation Age of Onset  ? Thyroid disease Mother   ? ? ?Social History  ? ?Socioeconomic History  ? Marital status: Single  ?  Spouse name: Not on file  ? Number of children: Not on file  ? Years of education: Not on file  ? Highest education level: Not on file  ?Occupational History  ? Not on file  ?Tobacco Use  ? Smoking status: Never  ? Smokeless tobacco: Never  ?Vaping Use  ? Vaping Use: Never used  ?Substance and Sexual Activity  ? Alcohol use: Not Currently  ? Drug  use: Never  ? Sexual activity: Not Currently  ?  Birth control/protection: None  ?Other Topics Concern  ? Not on file  ?Social History Narrative  ? Not on file  ? ?Social Determinants of Health  ? ?Financial Resource Strain: Not on file  ?Food Insecurity: Not on file  ?Transportation Needs: Not on file  ?Physical Activity: Not on file  ?Stress: Not on file  ?Social Connections: Not on file  ?Intimate Partner Violence: Not on file  ? ? ?Review of Systems  ?Constitutional:  Negative for fever.  ?Eyes:  Positive for discharge and redness.  ?Respiratory:  Negative for shortness of breath.   ?All other systems reviewed and are negative. ? ?  ? ? ?Objective   ? ?BP 128/73   Pulse 81   Temp 98.1 ?F (36.7 ?C) (Oral)   Resp 16   Wt 118 lb 9.6 oz (53.8 kg)   SpO2 98%   BMI 19.14 kg/m?  ? ?Physical Exam ?Vitals and nursing note reviewed.  ?Constitutional:   ?   General: She is not in acute distress. ?HENT:  ?   Nose: Congestion present.  ?   Right Sinus: No maxillary sinus tenderness or frontal sinus tenderness.  ?   Left Sinus: No maxillary sinus tenderness or frontal sinus tenderness.  ?   Mouth/Throat:  ?  Mouth: Mucous membranes are moist.  ?Eyes:  ?   Conjunctiva/sclera: Conjunctivae normal.  ?   Pupils: Pupils are equal, round, and reactive to light.  ?Cardiovascular:  ?   Rate and Rhythm: Normal rate and regular rhythm.  ?Pulmonary:  ?   Effort: Pulmonary effort is normal.  ?   Breath sounds: Normal breath sounds.  ?Abdominal:  ?   Tenderness: There is no abdominal tenderness.  ?Neurological:  ?   General: No focal deficit present.  ?   Mental Status: She is alert and oriented to person, place, and time.  ? ? ? ?  ? ?Assessment & Plan:  ? ?1. Conjunctivitis of both eyes, unspecified conjunctivitis type ?Erythromycin ointment refilled. This has improved sx in the past ? ?2. Seasonal allergic rhinitis, unspecified trigger ?Singulair prescribed. Continue antihistamine and flonase.  ? ?No follow-ups on file.   ? ?Tommie Raymond, MD ? ? ?

## 2021-07-31 ENCOUNTER — Ambulatory Visit (INDEPENDENT_AMBULATORY_CARE_PROVIDER_SITE_OTHER): Payer: 59

## 2021-07-31 DIAGNOSIS — Z3042 Encounter for surveillance of injectable contraceptive: Secondary | ICD-10-CM

## 2021-07-31 MED ORDER — MEDROXYPROGESTERONE ACETATE 150 MG/ML IM SUSP
150.0000 mg | Freq: Once | INTRAMUSCULAR | Status: AC
  Administered 2021-07-31: 150 mg via INTRAMUSCULAR

## 2021-07-31 NOTE — Progress Notes (Signed)
Pt presents for injection of depo provera administered in left ventrogluteal tolerated injection well

## 2021-08-25 ENCOUNTER — Ambulatory Visit: Payer: Self-pay

## 2021-08-25 NOTE — Telephone Encounter (Addendum)
   Chief Complaint: Increased anxiety due to "some personal issues I'm having." Asking for referral to a therapist. Declines OV. Symptoms: Above Frequency: 2 weeks Pertinent Negatives: Patient denies any thoughts of self-harm. Disposition: [] ED /[] Urgent Care (no appt availability in office) / [] Appointment(In office/virtual)/ []  [facility]/ [] Home Care/ [] Refused Recommended Disposition /[] [place]/ [x]  Follow-up with PCP Additional Notes: Please advise pt.  Answer Assessment - Initial Assessment Questions 1. CONCERN: "Did anything happen that prompted you to call today?"      Anxiety 2. ANXIETY SYMPTOMS: "Can you describe how you (your loved one; patient) have been feeling?" (e.g., tense, restless, panicky, anxious, keyed up, overwhelmed, sense of impending doom).      Personal issues 3. ONSET: "How long have you been feeling this way?" (e.g., hours, days, weeks)     Days 4. SEVERITY: "How would you rate the level of anxiety?" (e.g., 0 - 10; or mild, moderate, severe).     6 5. FUNCTIONAL IMPAIRMENT: "How have these feelings affected your ability to do daily activities?" "Have you had more difficulty than usual doing your normal daily activities?" (e.g., getting better, same, worse; self-care, school, work, interactions)     No 6. HISTORY: "Have you felt this way before?" "Have you ever been diagnosed with an anxiety problem in the past?" (e.g., generalized anxiety disorder, panic attacks, PTSD). If Yes, ask: "How was this problem treated?" (e.g., medicines, counseling, etc.)     No 7. RISK OF HARM - SUICIDAL IDEATION: "Do you ever have thoughts of hurting or killing yourself?" If Yes, ask:  "Do you have these feelings now?" "Do you have a plan on how you would do this?"     No 8. TREATMENT:  "What has been done so far to treat this anxiety?" (e.g., medicines, relaxation strategies). "What has helped?"     Medication - stopped  meds 9. TREATMENT - THERAPIST: "Do  you have a counselor or therapist? Name?"     No 10. POTENTIAL TRIGGERS: "Do you drink caffeinated beverages (e.g., coffee, colas, teas), and how much daily?" "Do you drink alcohol or use any drugs?" "Have you started any new medicines recently?"     No 10. PATIENT SUPPORT: "Who is with you now?" "Who do you live with?" "Do you have family or friends who you can talk to?"        Yes 11. OTHER SYMPTOMS: "Do you have any other symptoms?" (e.g., feeling depressed, trouble concentrating, trouble sleeping, trouble breathing, palpitations or fast heartbeat, chest pain, sweating, nausea, or diarrhea)       No 12. PREGNANCY: "Is there any chance you are pregnant?" "When was your last menstrual period?"       No  Protocols used: Anxiety and Panic Attack-A-AH

## 2021-08-27 NOTE — Telephone Encounter (Signed)
Please advise pt. Thanks.  

## 2021-08-28 NOTE — Telephone Encounter (Signed)
Patient will  wait until new social worker get a schedule

## 2021-09-01 ENCOUNTER — Telehealth: Payer: Self-pay | Admitting: Family Medicine

## 2021-09-01 NOTE — Telephone Encounter (Signed)
Copied from CRM [id number]. Topic: Appointment Scheduling - Scheduling Inquiry for Clinic >> Sep 01, 2021  1:29 PM Haroldine Laws wrote: Reason for CRM: Pt called back trying to schedule an appt with her therapist . She said she called last week and no one has called her back  CB#  [phone]   Pt is requesting a referral to [facility] since we do not currently have our therapist in place.

## 2021-09-09 ENCOUNTER — Telehealth: Payer: Self-pay | Admitting: Family Medicine

## 2021-09-09 NOTE — Telephone Encounter (Signed)
Called pt. twice and left brief voicemail requesting that she calls me back, to talk about services needed and to possibly schedule an appt.w/ LCSWA

## 2021-09-09 NOTE — Telephone Encounter (Signed)
Copied from CRM [id number]. Topic: Referral - Status >> Sep 08, 2021 11:36 AM Macon Large wrote: Reason for CRM: Pt called for an update on referral for Behavioral Health. Pt also requests to change providers. Pt stated she would like to request to see another provider because it has been a few times in which things were not done and she would prefer to see another provider. Cb# [phone]

## 2021-09-09 NOTE — Telephone Encounter (Signed)
Patient called and informed that our new therapist Reginia Naas would be contacting her to schedule an appt.

## 2021-09-09 NOTE — Telephone Encounter (Signed)
Patient was called and advise that our new therapist Reginia Naas will be contacting her on 09/09/2021. Faith Ross

## 2021-09-11 NOTE — Telephone Encounter (Signed)
LCSWA returned pt call. Pt discussed her concerns about not being able to find a provider for [facility] that accepted her insurance. Pt discussed how her depression and anxiety are affecting her life. Pt would like to schedule an appt. with LCSWA to develop some coping skills and discuss her issues. LCSWA has scheduled an appt for pt on 7/27 @ [facility] .

## 2021-09-11 NOTE — Telephone Encounter (Signed)
noted 

## 2021-09-18 ENCOUNTER — Ambulatory Visit (INDEPENDENT_AMBULATORY_CARE_PROVIDER_SITE_OTHER): Payer: 59 | Admitting: Licensed Clinical Social Worker

## 2021-09-18 DIAGNOSIS — F411 Generalized anxiety disorder: Secondary | ICD-10-CM | POA: Diagnosis not present

## 2021-09-18 DIAGNOSIS — F410 Panic disorder [episodic paroxysmal anxiety] without agoraphobia: Secondary | ICD-10-CM

## 2021-09-22 NOTE — BH Specialist Note (Signed)
Integrated Behavioral Health Initial In-Person Visit  MRN: [id number] Name: Faith Ross  Number of Integrated Behavioral Health Clinician visits: 1- Initial Visit  Session Start time: 0935    Session End time: 1041  Total time in minutes: 66   Types of Service: Individual psychotherapy  Interpretor:No. Interpretor Name and Language: English    Warm Hand Off Completed.    Subjective: Faith Ross is a 31 y.o. female accompanied by  herself. Patient was referred by PCP Dr. Andrey Campanile for anxiety and depression. Patient reports the following symptoms/concerns: over-thinking, not getting sleep, always thinking something bad will happen Duration of problem: since 2017 ; Severity of problem: moderate  Objective: Mood:  sad  and Affect: Appropriate and Tearful Risk of harm to self or others: No plan to harm self or others  Life Context: Family and Social:The patient reports she has a good relationship with her mom and twin sister. She has group of friends that she doesn't feel they are a close as they use to be. Pt lives by herself. School/Work: Pt graduated from college and is now working at [employer]. Pt does not like her job there, she feels she is underpaid and the job is a lot of stress on her body. Self-Care: Pt reports that she loves to take care of herself by doing her hair, makeup, lashes, yoga, cooking, and walking her dog. Life Changes: Relationship with her boyfriend ended less than 6 months ago, work being stressful and relationship with friends changing.  Patient and/or Family's Strengths/Protective Factors: Social connections, Concrete supports in place (healthy food, safe environments, etc.), and Sense of purpose  Goals Addressed: Patient will: Reduce symptoms of: anxiety and panic attacks Increase knowledge and/or ability of: self-management skills  Demonstrate ability to: Increase motivation to adhere to plan of care  Progress towards  Goals: Ongoing  Interventions: Interventions utilized: Mindfulness or Management consultant and Psychoeducation and/or Health Education  Standardized Assessments completed: GAD-7 and PHQ 9   Patient and/or Family Response: Pt was very tearful and worried about her over-thinking things. Pt reports things have been ok lately, but she feels that her last relationship ended because she often expects the worse from people and situations and she doesn't like being negative all the time. Pt states that her job is putting a lot of stress on her physically due to moving materials a lot in the heat. Pt talks about wanted to find another job and go to school for cosmetology. Pt currently has a BA degree in Spanish. Pt was receptive to information provided as it relates to communication and boundaries with her friends, and furthering her self care skills. Pt explored thinking about ways to find new employment.   Patient Centered Plan: Patient is on the following Treatment Plan(s):  Anxiety and Panic Attacks  Assessment: Patient currently experiencing complications and long hours at her job, feeling as though she is being overlooked, panic attacks.   Patient may benefit from continued support of Primary Care at [facility] to gain knowledge and implement new and consistent stress management techniques, such has meditation, more yoga, and deep breathing exercises. 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   Plan: Follow up with behavioral health clinician on : 09/25/21 Behavioral recommendations: Faith Ross will practice new and consistent stress management techniques, such has meditation, more yoga, and deep breathing exercises. Faith Ross will engage in looking for new employment and finding grants for school.  Referral(s):  [facility] (In Clinic) "From scale of 1-10, how likely are you to follow plan?": Pt agrees to the above  plan.   Screenings completed on 09/18/21- during her appt.    09/22/2021    3:56 PM 09/22/2021    3:55 PM 09/22/2021    3:54 PM  PHQ-SADS Last 3 Score only  Total GAD-7 Score 6 6   PHQ Adolescent Score   4    Vassie Loll, [address]

## 2021-09-25 ENCOUNTER — Encounter: Payer: Self-pay | Admitting: Licensed Clinical Social Worker

## 2021-09-25 ENCOUNTER — Ambulatory Visit (INDEPENDENT_AMBULATORY_CARE_PROVIDER_SITE_OTHER): Payer: 59 | Admitting: Licensed Clinical Social Worker

## 2021-09-25 DIAGNOSIS — F410 Panic disorder [episodic paroxysmal anxiety] without agoraphobia: Secondary | ICD-10-CM | POA: Diagnosis not present

## 2021-09-25 DIAGNOSIS — F411 Generalized anxiety disorder: Secondary | ICD-10-CM | POA: Diagnosis not present

## 2021-09-25 NOTE — Telephone Encounter (Signed)
This encounter was created in error - please disregard.

## 2021-09-25 NOTE — BH Specialist Note (Signed)
Integrated Behavioral Health Follow Up In-Person Visit  MRN: [id number] Name: Faith Ross  Number of Integrated Behavioral Health Clinician visits: 2- Second Visit  Session Start time: [phone]   Session End time: 1035  Total time in minutes: 49   Types of Service: Individual psychotherapy  Interpretor:No. Interpretor Name and Language: n/a  Subjective: Faith Ross is a 31 y.o. female accompanied by  herself. Patient was referred by Dr. Andrey Campanile for anxiety some depression and panic attacks. Patient reports the following symptoms/concerns: over-thinking, not getting sleep, always thinking something bad will happen Duration of problem: 2017; Severity of problem: moderate  Objective: Mood: Euthymic and Affect: Appropriate Risk of harm to self or others: No plan to harm self or others  Life Context: Family and Social:The patient reports she has a good relationship with her mom and twin sister. She has group of friends that she doesn't feel they are a close as they use to be. Pt lives by herself. School/Work: Pt graduated from college and is now working at [employer]. Pt does not like her job there, she feels she is underpaid and the job is a lot of stress on her body. Self-Care: Pt reports that she loves to take care of herself by doing her hair, makeup, lashes, yoga, cooking, and walking her dog. Life Changes: Relationship with her boyfriend ended less than 6 months ago, work being stressful and relationship with friends changing.  Patient and/or Family's Strengths/Protective Factors: Social connections, Concrete supports in place (healthy food, safe environments, etc.), and Sense of purpose  Goals Addressed: Patient will:  Reduce symptoms of: anxiety and panic attacks.    Increase knowledge and/or ability of: coping skills   Demonstrate ability to: Increase motivation to adhere to plan of care  Progress towards Goals: Ongoing  Interventions: Interventions utilized:   Mindfulness or Management consultant, CBT Cognitive Behavioral Therapy, and Guided Imagery Standardized Assessments completed: Not Needed  Patient and/or Family Response: Patient stated that she was feeling much better today from last week. Pt  was able to share different goals we discussed the previous week that she accomplished this week. Pt stated that she applied for about 5-6 jobs, she updated her resume. Pt responded with her goal is to find a new full time job or a supplemental job to save for cosmetology school. Pt was open to doing a opening breathing exercises with LCSWA, spoke about how writing in her journal was working for her and medication. Pt was open an activity we did together to process her thoughts when over-thinking or becoming agitated. Thought Diffusion cognitive distancing techniques. Pt was receptive to receiving a list of positive affirmations as well grateful quotes. Lastly, pt talked about processing grief from her fathers death and past guilt of their relationship.   Patient Centered Plan: Patient is on the following Treatment Plan(s): Anxiety and Panic Attacks  Assessment: Patient currently experiencing a sense of hopefulness about her future. Pt is experiencing a lack of sleep at night. Pt reports she often toss and turns throughout the night and had questing for her PCP about mediations she is currently taking.   Patient may benefit from continued support of Primary Care at [facility] to gain knowledge and support with staying consistent with stress management techniques, such has meditation, more yoga, and deep breathing exercises. Pt scheduling a follow up appointment with her PCP for medication education and management will be beneficial. Pt goal is to remain consistent with self care daily routine. Pt was advised to write  her father a letter expressing her past anger towards him as well as reasons why she forgives him and herself. Pt loved the idea of the task to release her  negative thoughts around him  Plan: Follow up with behavioral health clinician on : 10/30/21 Behavioral recommendations: Staying consistent with stress management techniques, such has meditation, more yoga, and deep breathing exercises. Pt scheduling a follow up appointment with her PCP for medication education and management will be beneficial. Pt goal is to remain consistent with self care daily routine. Pt was advised to write her father a letter expressing her past anger towards him as well as reasons why she forgives him and herself. Pt loved the idea of the task to release her negative thoughts around him. Referral(s): [facility] [employer] (In Clinic) "From scale of 1-10, how likely are you to follow plan?": Pt agrees to the above plan.   Vassie Loll, LCSWA

## 2021-10-02 ENCOUNTER — Ambulatory Visit: Payer: 59 | Admitting: Family Medicine

## 2021-10-30 ENCOUNTER — Ambulatory Visit: Payer: Self-pay | Admitting: Licensed Clinical Social Worker

## 2021-10-30 ENCOUNTER — Telehealth: Payer: Self-pay | Admitting: Licensed Clinical Social Worker

## 2021-10-30 NOTE — Telephone Encounter (Signed)
Thank you :)

## 2021-10-30 NOTE — Telephone Encounter (Signed)
Copied from CRM [id number]. Topic: Appointment Scheduling - Scheduling Inquiry for Clinic >> Oct 30, 2021 10:39 AM Franchot Heidelberg wrote: Reason for CRM: Pt needs to reschedule her appt today, please advise. Behavioral Health  Best contact: [phone]

## 2021-11-06 ENCOUNTER — Ambulatory Visit (INDEPENDENT_AMBULATORY_CARE_PROVIDER_SITE_OTHER): Payer: Self-pay | Admitting: Licensed Clinical Social Worker

## 2021-11-06 DIAGNOSIS — F411 Generalized anxiety disorder: Secondary | ICD-10-CM

## 2021-11-06 DIAGNOSIS — F410 Panic disorder [episodic paroxysmal anxiety] without agoraphobia: Secondary | ICD-10-CM

## 2021-11-06 NOTE — BH Specialist Note (Unsigned)
Integrated Behavioral Health Follow Up In-Person Visit  MRN: [id number] Name: Faith Ross  Number of Integrated Behavioral Health Clinician visits: 3- Third Visit Session Start time: 1328   Session End time: 1415  Total time in minutes: 47   Types of Service: Individual psychotherapy  Interpretor:No.   Subjective: Faith Ross is a 31 y.o. female accompanied by  herself. Patient was referred by Dr. Andrey Campanile for anxiety some depression and panic attacks. Patient reports the following symptoms/concerns: over-thinking, not getting sleep, always thinking something bad will happen Duration of problem: 2017; Severity of problem: moderate   Objective: Mood: Euthymic and Affect: Appropriate Risk of harm to self or others: No plan to harm self or others  Life Context: Family and Social:The patient reports she has a good relationship with her mom and twin sister. She has group of friends that she doesn't feel they are a close as they use to be. Pt lives by herself. School/Work: Pt graduated from college and is now working at [employer]. Pt does not like her job there, she feels she is underpaid and the job is a lot of stress on her body. Self-Care: Pt reports that she loves to take care of herself by doing her hair, makeup, lashes, yoga, cooking, and walking her dog. Life Changes: Coming to therapy  Patient and/or Family's Strengths/Protective Factors: Social and Emotional competence, Concrete supports in place (healthy food, safe environments, etc.), Sense of purpose, and Physical Health (exercise, healthy diet, medication compliance, etc.)  Goals Addressed: Patient will:  Reduce symptoms of: anxiety and panic attacks    Increase knowledge and/or ability of: coping skills and healthy habits   Demonstrate ability to: Increase motivation to adhere to plan of care  Progress towards Goals: Ongoing  Interventions: Interventions utilized:  Mindfulness or Relaxation Training and CBT  Cognitive Behavioral Therapy Standardized Assessments completed: GAD-7 and PHQ 9  Patient and/or Family Response: Patient reports that she is doing much better. She recently went to visit her sister, mom and grandmother and it made her feel a lot better to be around family. She states that she cut things off with a guy she was dealing with because it was unhealthy for her mentally. Patient shared that her grandparents on both sides of her family are experiencing a lot of illnesses right now. Patient reports having a panic attack about 3 weeks ago. After an incident took place at work with an angry customer speaking disrespectfully to her. She was expecting her coworkers to take up for her, but they did not. Patient is very eager to find a new job. Patient reports spending time doing self-work using the "the Shadow Work Journal", she wrote a letter to her deceased father, as LCSWA guided her to last session. Patient states that this task brought on a lot of emotions for her but she felt better after completing it. Patient is very receptive to continuing to work on herself and continue coping skills to minimize her panic attacks and anxiety.  Patient Centered Plan: Patient is on the following Treatment Plan(s): Anxiety and Panic Attacks  Assessment: Patient currently experiencing a sense of happiness within. She reports she she is sleeping a little better, still over thinks but not as often, and has had a panic attack. She is feeling pretty good about herself and emotions overall. Patient states that she often listens to [place] music when she seems down and it uplifts her. Pt is still not interested in medications at this time.  Patient may  benefit from continued support of Primary Care at [facility] to gain knowledge and support with staying consistent with stress management techniques, such has meditation, more yoga, and deep breathing exercises. Following up with job applications submitted.    Plan: Follow up with behavioral health clinician on : in 5 weeks Behavioral recommendations: Support with staying consistent with stress management techniques, such has meditation, more yoga, and deep breathing exercises. Patient agreed to journaling and  listening to christian music. Pt goal is to remain consistent with self care daily routine. Pt was advised to review the letter she wrote hr father and when she is ready let it go in what ever way she desires. Referral(s): Integrated Behavioral Health Services (In Clinic) "From scale of 1-10, how likely are you to follow plan?": most likely     11/06/2021    4:24 PM 09/22/2021    3:56 PM 09/22/2021    3:55 PM  PHQ-SADS Last 3 Score only  Total GAD-7 Score 6 6 6   PHQ Adolescent Score 3      , LCSWA

## 2021-11-12 ENCOUNTER — Ambulatory Visit: Payer: Self-pay | Admitting: *Deleted

## 2021-11-12 NOTE — Telephone Encounter (Signed)
  Chief Complaint: L neck pain Symptoms: pain, swelling- possible lymph node Frequency: 1 1/2 months Pertinent Negatives: Patient denies any sinus symptoms, fever Disposition: [] ED /[] Urgent Care (no appt availability in office) / [x] Appointment(In office/virtual)/ []  [facility]/ [] Home Care/ [] Refused Recommended Disposition /[] [place]/ []  Follow-up with PCP Additional Notes:

## 2021-11-12 NOTE — Telephone Encounter (Signed)
Reason for Disposition  [1] Single large node AND [2] size > 1 inch (2.5 cm) AND [3] no fever  Answer Assessment - Initial Assessment Questions 1. LOCATION: "Where is the swollen node located?" "Is the matching node on the other side of the body also swollen?"      Left side of neck 2. SIZE: "How big is the node?" (e.g., inches or centimeters; or compared to common objects such as pea, bean, marble, golf ball)      Feels knot-grape size- painful 3. ONSET: "When did the swelling start?"      1 1/2 month 4. NECK NODES: "Is there a sore throat, runny nose or other symptoms of a cold?"      no 5. GROIN OR ARMPIT NODES: "Is there a sore, scratch, cut or painful red area on that arm or leg?"      no 6. FEVER: "Do you have a fever?" If Yes, ask: "What is it, how was it measured, and when did it start?"      no 7. CAUSE: "What do you think is causing the swollen lymph nodes?"     unsure 8. OTHER SYMPTOMS: "Do you have any other symptoms?"     No other symptoms 9. PREGNANCY: "Is there any chance you are pregnant?" "When was your last menstrual period?"  Protocols used: Lymph Nodes - Swollen-A-AH

## 2021-11-13 ENCOUNTER — Ambulatory Visit (INDEPENDENT_AMBULATORY_CARE_PROVIDER_SITE_OTHER): Payer: Self-pay | Admitting: Physician Assistant

## 2021-11-13 ENCOUNTER — Encounter: Payer: Self-pay | Admitting: Physician Assistant

## 2021-11-13 VITALS — BP 116/79 | HR 78 | Temp 98.1°F | Resp 16 | Wt 118.4 lb

## 2021-11-13 DIAGNOSIS — R590 Localized enlarged lymph nodes: Secondary | ICD-10-CM

## 2021-11-13 NOTE — Progress Notes (Signed)
Patient ID: Faith Ross, female   DOB: Apr 02, 1990, 31 y.o.   MRN: [id number]   Faith Ross, is a 31 y.o. female  [id number]  [id number]  DOB - 05/11/1990  No chief complaint on file.      Subjective:   Faith Ross is a 31 y.o. female here today for a "knot" on her neck that she noticed about 6 weeks ago.  It is tender.  She denies any fevers, night sweats.  Appetite is good.  No weight loss.  No ST.  No sign of systemic infection.  Feels good.  Normal energy.    No problems updated.  ALLERGIES: Allergies  Allergen Reactions   Toradol [Ketorolac Tromethamine] Anxiety    PAST MEDICAL HISTORY: Past Medical History:  Diagnosis Date   Alopecia    Anxiety    Chest wall pain    Iron deficiency anemia     MEDICATIONS AT HOME: Prior to Admission medications   Medication Sig Start Date End Date Taking? Authorizing Provider  cyclobenzaprine (FLEXERIL) 10 MG tablet Take 1 tablet (10 mg total) by mouth at bedtime as needed for muscle spasms. 01/28/21   Rayna Sexton, PA-C  hydrOXYzine (ATARAX) 10 MG tablet Take 1 tablet (10 mg total) by mouth 3 (three) times daily as needed. 04/22/21   Mayers, Cari S, PA-C  medroxyPROGESTERone (DEPO-PROVERA) 150 MG/ML injection See admin instructions.    [provider]  montelukast (SINGULAIR) 10 MG tablet Take 1 tablet (10 mg total) by mouth at bedtime. 07/03/21   Dorna Mai, MD  naproxen (NAPROSYN) 500 MG tablet Take 1 tablet (500 mg total) by mouth 2 (two) times daily. 06/03/21   Sherrill Raring, PA-C  sertraline (ZOLOFT) 25 MG tablet Take 1 tablet (25 mg total) by mouth daily. 04/22/21   Mayers, Cari S, PA-C    ROS: Neg cardiac Neg GI Neg GU Neg MS Neg psych Neg neuro  Objective:   Vitals:   11/13/21 0952  BP: 116/79  Pulse: 78  Resp: 16  Temp: 98.1 F (36.7 C)  TempSrc: Oral  SpO2: 97%  Weight: 118 lb 6.4 oz (53.7 kg)   Exam General appearance : Awake, alert, not in any distress. Speech Clear. Not  toxic looking HEENT: Atraumatic and Normocephalic.  B TM WNL.  Throat WNL.   Neck: Supple, no JVD. No cervical lymphadenopathy. At the base of the ear(proximal posterior cervical chain there is a freely mobile <1cm LN that is slightly tender.  There is a similar LN on the R that is not tender.  No occiptal nodes.  No AC nodes.  No submandibular nodes.  No virchow's node Chest: Good air entry bilaterally, CTAB.  No rales/rhonchi/wheezing CVS: S1 S2 regular, no murmurs.  Extremities: B/L Lower Ext shows no edema, both legs are warm to touch Neurology: Awake alert, and oriented X 3, CN II-XII intact, Non focal Skin: No Rash  Data Review No results found for: "HGBA1C"  Assessment & Plan   1. Lymphadenopathy of left cervical region Seems like a normal/benign LN.  Advised tylenol or advil for now and have evaluated by ENT - CBC with Differential/Platelet - Ambulatory referral to ENT    Return if symptoms worsen or fail to improve.  The patient was given clear instructions to go to ER or return to medical center if symptoms don't improve, worsen or new problems develop. The patient verbalized understanding. The patient was told to call to get lab results if they haven't heard anything in the next week.  Georgian Co, PA-C [facility] and [facility] [place], [place] [phone]   11/13/2021, 10:29 AM

## 2021-11-14 LAB — CBC WITH DIFFERENTIAL/PLATELET
Basophils Absolute: 0 10*3/uL (ref 0.0–0.2)
Basos: 1 %
EOS (ABSOLUTE): 0 10*3/uL (ref 0.0–0.4)
Eos: 2 %
Hematocrit: 34.3 % (ref 34.0–46.6)
Hemoglobin: 11.6 g/dL (ref 11.1–15.9)
Immature Grans (Abs): 0 10*3/uL (ref 0.0–0.1)
Immature Granulocytes: 0 %
Lymphocytes Absolute: 1.1 10*3/uL (ref 0.7–3.1)
Lymphs: 52 %
MCH: 29.6 pg (ref 26.6–33.0)
MCHC: 33.8 g/dL (ref 31.5–35.7)
MCV: 88 fL (ref 79–97)
Monocytes Absolute: 0.2 10*3/uL (ref 0.1–0.9)
Monocytes: 10 %
Neutrophils Absolute: 0.7 10*3/uL — ABNORMAL LOW (ref 1.4–7.0)
Neutrophils: 35 %
Platelets: 245 10*3/uL (ref 150–450)
RBC: 3.92 x10E6/uL (ref 3.77–5.28)
RDW: 11.9 % (ref 11.7–15.4)
WBC: 2.1 10*3/uL — CL (ref 3.4–10.8)

## 2021-11-18 ENCOUNTER — Other Ambulatory Visit: Payer: Self-pay | Admitting: Physician Assistant

## 2021-11-18 DIAGNOSIS — D709 Neutropenia, unspecified: Secondary | ICD-10-CM

## 2021-12-11 ENCOUNTER — Ambulatory Visit: Payer: Self-pay | Admitting: Licensed Clinical Social Worker

## 2021-12-24 ENCOUNTER — Encounter (HOSPITAL_BASED_OUTPATIENT_CLINIC_OR_DEPARTMENT_OTHER): Payer: Self-pay

## 2021-12-24 ENCOUNTER — Emergency Department (HOSPITAL_BASED_OUTPATIENT_CLINIC_OR_DEPARTMENT_OTHER)
Admission: EM | Admit: 2021-12-24 | Discharge: 2021-12-24 | Disposition: A | Discharge status: Home / Self Care | Payer: Medicaid Other | Attending: Emergency Medicine | Admitting: Emergency Medicine

## 2021-12-24 ENCOUNTER — Ambulatory Visit: Payer: Self-pay | Admitting: *Deleted

## 2021-12-24 ENCOUNTER — Encounter (HOSPITAL_BASED_OUTPATIENT_CLINIC_OR_DEPARTMENT_OTHER): Payer: Self-pay | Admitting: Emergency Medicine

## 2021-12-24 ENCOUNTER — Other Ambulatory Visit: Payer: Self-pay

## 2021-12-24 DIAGNOSIS — Z20822 Contact with and (suspected) exposure to covid-19: Secondary | ICD-10-CM | POA: Insufficient documentation

## 2021-12-24 DIAGNOSIS — R519 Headache, unspecified: Secondary | ICD-10-CM | POA: Insufficient documentation

## 2021-12-24 DIAGNOSIS — S46811A Strain of other muscles, fascia and tendons at shoulder and upper arm level, right arm, initial encounter: Secondary | ICD-10-CM | POA: Insufficient documentation

## 2021-12-24 DIAGNOSIS — S46819A Strain of other muscles, fascia and tendons at shoulder and upper arm level, unspecified arm, initial encounter: Secondary | ICD-10-CM

## 2021-12-24 DIAGNOSIS — S46812A Strain of other muscles, fascia and tendons at shoulder and upper arm level, left arm, initial encounter: Secondary | ICD-10-CM | POA: Insufficient documentation

## 2021-12-24 DIAGNOSIS — M546 Pain in thoracic spine: Secondary | ICD-10-CM | POA: Insufficient documentation

## 2021-12-24 DIAGNOSIS — Y990 Civilian activity done for income or pay: Secondary | ICD-10-CM | POA: Insufficient documentation

## 2021-12-24 DIAGNOSIS — X500XXA Overexertion from strenuous movement or load, initial encounter: Secondary | ICD-10-CM | POA: Insufficient documentation

## 2021-12-24 LAB — COMPREHENSIVE METABOLIC PANEL
ALT: 20 U/L (ref 0–44)
AST: 19 U/L (ref 15–41)
Albumin: 4 g/dL (ref 3.5–5.0)
Alkaline Phosphatase: 32 U/L — ABNORMAL LOW (ref 38–126)
Anion gap: 6 (ref 5–15)
BUN: 8 mg/dL (ref 6–20)
CO2: 24 mmol/L (ref 22–32)
Calcium: 9.3 mg/dL (ref 8.9–10.3)
Chloride: 107 mmol/L (ref 98–111)
Creatinine, Ser: 0.96 mg/dL (ref 0.44–1.00)
GFR, Estimated: >60 mL/min (ref >60)
Glucose, Bld: 102 mg/dL — ABNORMAL HIGH (ref 70–99)
Potassium: 3.6 mmol/L (ref 3.5–5.1)
Sodium: 137 mmol/L (ref 135–145)
Total Bilirubin: 0.4 mg/dL (ref 0.3–1.2)
Total Protein: 9.3 g/dL — ABNORMAL HIGH (ref 6.5–8.1)

## 2021-12-24 LAB — URINALYSIS, ROUTINE W REFLEX MICROSCOPIC
Bilirubin Urine: NEGATIVE
Glucose, UA: NEGATIVE mg/dL
Hgb urine dipstick: NEGATIVE
Ketones, ur: 15 mg/dL — AB
Leukocytes,Ua: NEGATIVE
Nitrite: NEGATIVE
Protein, ur: NEGATIVE mg/dL
Specific Gravity, Urine: 1.02 (ref 1.005–1.030)
pH: 7 (ref 5.0–8.0)

## 2021-12-24 LAB — RESP PANEL BY RT-PCR (FLU A&B, COVID) ARPGX2
Influenza A by PCR: NEGATIVE
Influenza B by PCR: NEGATIVE
SARS Coronavirus 2 by RT PCR: NEGATIVE

## 2021-12-24 LAB — CBC WITH DIFFERENTIAL/PLATELET
Abs Immature Granulocytes: 0.01 10*3/uL (ref 0.00–0.07)
Basophils Absolute: 0 10*3/uL (ref 0.0–0.1)
Basophils Relative: 0 %
Eosinophils Absolute: 0 10*3/uL (ref 0.0–0.5)
Eosinophils Relative: 0 %
HCT: 32 % — ABNORMAL LOW (ref 36.0–46.0)
Hemoglobin: 11.3 g/dL — ABNORMAL LOW (ref 12.0–15.0)
Immature Granulocytes: 0 %
Lymphocytes Relative: 19 %
Lymphs Abs: 0.6 10*3/uL — ABNORMAL LOW (ref 0.7–4.0)
MCH: 30.8 pg (ref 26.0–34.0)
MCHC: 35.3 g/dL (ref 30.0–36.0)
MCV: 87.2 fL (ref 80.0–100.0)
Monocytes Absolute: 0.1 10*3/uL (ref 0.1–1.0)
Monocytes Relative: 4 %
Neutro Abs: 2.3 10*3/uL (ref 1.7–7.7)
Neutrophils Relative %: 77 %
Platelets: 244 10*3/uL (ref 150–400)
RBC: 3.67 MIL/uL — ABNORMAL LOW (ref 3.87–5.11)
RDW: 12.2 % (ref 11.5–15.5)
WBC: 3 10*3/uL — ABNORMAL LOW (ref 4.0–10.5)
nRBC: 0 % (ref 0.0–0.2)

## 2021-12-24 LAB — HCG, SERUM, QUALITATIVE: Preg, Serum: NEGATIVE

## 2021-12-24 LAB — MONONUCLEOSIS SCREEN: Mono Screen: NEGATIVE

## 2021-12-24 LAB — GROUP A STREP BY PCR: Group A Strep by PCR: NOT DETECTED

## 2021-12-24 LAB — SEDIMENTATION RATE: Sed Rate: 33 mm/hr — ABNORMAL HIGH (ref 0–22)

## 2021-12-24 MED ORDER — IBUPROFEN 800 MG PO TABS
800.0000 mg | ORAL_TABLET | Freq: Once | ORAL | Status: AC
  Administered 2021-12-24: 800 mg via ORAL
  Filled 2021-12-24: qty 1

## 2021-12-24 MED ORDER — DIPHENHYDRAMINE HCL 50 MG/ML IJ SOLN
25.0000 mg | Freq: Once | INTRAMUSCULAR | Status: AC
  Administered 2021-12-24: 25 mg via INTRAVENOUS
  Filled 2021-12-24: qty 1

## 2021-12-24 MED ORDER — IBUPROFEN 600 MG PO TABS: 600.0000 mg | ORAL_TABLET | Freq: Three times a day (TID) | ORAL | 0 refills | Status: DC | PRN

## 2021-12-24 MED ORDER — DIPHENHYDRAMINE HCL 25 MG PO TABS: 25.0000 mg | ORAL_TABLET | Freq: Four times a day (QID) | ORAL | 0 refills | Status: DC | PRN

## 2021-12-24 MED ORDER — METOCLOPRAMIDE HCL 10 MG PO TABS: 10.0000 mg | ORAL_TABLET | Freq: Three times a day (TID) | ORAL | 0 refills | Status: AC | PRN

## 2021-12-24 MED ORDER — ACETAMINOPHEN 500 MG PO TABS
1000.0000 mg | ORAL_TABLET | Freq: Once | ORAL | Status: AC
  Administered 2021-12-24: 1000 mg via ORAL
  Filled 2021-12-24: qty 2

## 2021-12-24 MED ORDER — METHOCARBAMOL 750 MG PO TABS: 750.0000 mg | ORAL_TABLET | Freq: Three times a day (TID) | ORAL | 0 refills | Status: AC | PRN

## 2021-12-24 MED ORDER — METOCLOPRAMIDE HCL 10 MG PO TABS: 10.0000 mg | ORAL_TABLET | Freq: Three times a day (TID) | ORAL | 0 refills | Status: DC | PRN

## 2021-12-24 MED ORDER — LACTATED RINGERS IV BOLUS
1000.0000 mL | Freq: Once | INTRAVENOUS | Status: AC
  Administered 2021-12-24: 1000 mL via INTRAVENOUS

## 2021-12-24 MED ORDER — METOCLOPRAMIDE HCL 5 MG/ML IJ SOLN
10.0000 mg | Freq: Once | INTRAMUSCULAR | Status: AC
  Administered 2021-12-24: 10 mg via INTRAVENOUS
  Filled 2021-12-24: qty 2

## 2021-12-24 MED ORDER — DIPHENHYDRAMINE HCL 25 MG PO TABS: 25.0000 mg | ORAL_TABLET | Freq: Four times a day (QID) | ORAL | 0 refills | Status: AC | PRN

## 2021-12-24 NOTE — ED Triage Notes (Signed)
Persistent headache , was seen earlier today , muscle relaxer made her "worse".

## 2021-12-24 NOTE — ED Provider Notes (Signed)
[facility] EMERGENCY DEPARTMENT Provider Note   CSN: [id number] Arrival date & time: 12/24/21  0254     History  Chief Complaint  Patient presents with   Back Pain    Faith Ross is a 31 y.o. female.  Pt with c/o mid back pain and bil trapezius area pain in past couple days. Moderate, dull, non radiating, not radicular.  States work involves some heavy lifting, but denies specific injury.  Feels similar to prior back/msk strain. No fever or chills. No numbness/weakness. Took ibuprofen this AM.   The history is provided by the patient.  Back Pain Associated symptoms: no abdominal pain, no chest pain, no dysuria, no fever, no numbness and no weakness        Home Medications Prior to Admission medications   Medication Sig Start Date End Date Taking? Authorizing Provider  cyclobenzaprine (FLEXERIL) 10 MG tablet Take 1 tablet (10 mg total) by mouth at bedtime as needed for muscle spasms. 01/28/21   Placido Sou, PA-C  hydrOXYzine (ATARAX) 10 MG tablet Take 1 tablet (10 mg total) by mouth 3 (three) times daily as needed. 04/22/21   Mayers, Cari S, PA-C  medroxyPROGESTERone (DEPO-PROVERA) 150 MG/ML injection See admin instructions.    [provider]  montelukast (SINGULAIR) 10 MG tablet Take 1 tablet (10 mg total) by mouth at bedtime. 07/03/21   Georganna Skeans, MD  naproxen (NAPROSYN) 500 MG tablet Take 1 tablet (500 mg total) by mouth 2 (two) times daily. 06/03/21   Theron Arista, PA-C  sertraline (ZOLOFT) 25 MG tablet Take 1 tablet (25 mg total) by mouth daily. 04/22/21   Mayers, Kasandra Knudsen, PA-C      Allergies    Toradol [ketorolac tromethamine]    Review of Systems   Review of Systems  Constitutional:  Negative for fever.  Respiratory:  Negative for cough and shortness of breath.   Cardiovascular:  Negative for chest pain.  Gastrointestinal:  Negative for abdominal pain, nausea and vomiting.  Genitourinary:  Negative for dysuria and flank pain.   Musculoskeletal:  Positive for back pain.  Neurological:  Negative for weakness and numbness.       Mild headache earlier, resolved.     Physical Exam Updated Vital Signs BP 125/76 (BP Location: Right Arm)   Pulse 98   Temp (!) 97.1 F (36.2 C) (Oral)   Resp 16   Ht 1.676 m (5\' 6" )   Wt 54.4 kg   SpO2 100%   BMI 19.37 kg/m  Physical Exam Vitals and nursing note reviewed.  Constitutional:      Appearance: Normal appearance. She is well-developed.  HENT:     Head: Atraumatic.     Nose: Nose normal.     Mouth/Throat:     Mouth: Mucous membranes are moist.  Eyes:     General: No scleral icterus.    Conjunctiva/sclera: Conjunctivae normal.     Pupils: Pupils are equal, round, and reactive to light.  Neck:     Trachea: No tracheal deviation.     Comments: No stiffness or rigidity Cardiovascular:     Rate and Rhythm: Normal rate and regular rhythm.     Pulses: Normal pulses.     Heart sounds: Normal heart sounds. No murmur heard.    No friction rub. No gallop.  Pulmonary:     Effort: Pulmonary effort is normal. No respiratory distress.     Breath sounds: Normal breath sounds.  Abdominal:     General: There is no  distension.     Tenderness: There is no abdominal tenderness.  Genitourinary:    Comments: No cva tenderness.  Musculoskeletal:        General: No swelling or tenderness.     Cervical back: Normal range of motion and neck supple. No rigidity or tenderness. No muscular tenderness.     Comments: CTLS spine, non tender, aligned, no step off. Bilateral trapezius and thoracic muscular tenderness. No sts. No skin lesions/rash.    Lymphadenopathy:     Cervical: No cervical adenopathy.  Skin:    General: Skin is warm and dry.     Findings: No rash.  Neurological:     Mental Status: She is alert.     Comments: Alert, speech normal. Motor/sens grossly intact bil. Steady gait.   Psychiatric:        Mood and Affect: Mood normal.     ED Results / Procedures /  Treatments   Labs (all labs ordered are listed, but only abnormal results are displayed) Labs Reviewed - No data to display  EKG None  Radiology No results found.  Procedures Procedures    Medications Ordered in ED Medications  acetaminophen (TYLENOL) tablet 1,000 mg (has no administration in time range)    ED Course/ Medical Decision Making/ A&P                           Medical Decision Making Problems Addressed: Acute bilateral thoracic back pain: acute illness or injury Strain of trapezius muscle, unspecified laterality, initial encounter: acute illness or injury  Amount and/or Complexity of Data Reviewed External Data Reviewed: notes.  Risk OTC drugs. Prescription drug management.   Reviewed nursing notes and prior charts for additional history.   Pt took ibuprofen earlier.   Acetaminophen po.  Rx robaxin.  Pt currently appears stable for d/c.   Rec pcp f/u.          Final Clinical Impression(s) / ED Diagnoses Final diagnoses:  None    Rx / DC Orders ED Discharge Orders     None         Lajean Saver, MD 12/24/21 [phone]

## 2021-12-24 NOTE — ED Notes (Signed)
Pt is still dizzy HA has improved rates 5/10 now.  Pt has been told she can not drive when she is discharged because of the meds she received here.  Pt verbalizes understanding and is calling for a ride

## 2021-12-24 NOTE — ED Provider Notes (Signed)
[facility] EMERGENCY DEPARTMENT Provider Note   CSN: [id number] Arrival date & time: 12/24/21  1821     History  Chief Complaint  Patient presents with   Headache    Faith Ross is a 31 y.o. female.  HPI Patient reports that she has a bad headache.  Reports she had a headache since yesterday.  She reports she woke up with it in the morning.  She went to bed feeling well in the evening.  She reports the headache is generalized and aching.  With pain in her temples and forehead.  Also pains that radiate down the back of her neck and back.  Patient denies any nausea or vomiting.  She has not had any fever.  No sore throat, earache or sinus congestion.  No generalized body aches.  she was seen earlier the same day and at that time documented to have thoracic back pain.  Patient reports that she does have upper back discomfort but was really bothering her most now is her headache.  Denies history of recurrent headaches.  Patient reports last sexual activity was 5 months ago.  she is on birth control.    Home Medications Prior to Admission medications   Medication Sig Start Date End Date Taking? Authorizing Provider  diphenhydrAMINE (BENADRYL) 25 MG tablet Take 1 tablet (25 mg total) by mouth every 6 (six) hours as needed. 12/24/21   Charlesetta Shanks, MD  hydrOXYzine (ATARAX) 10 MG tablet Take 1 tablet (10 mg total) by mouth 3 (three) times daily as needed. 04/22/21   Mayers, Cari S, PA-C  ibuprofen (ADVIL) 600 MG tablet Take 1 tablet (600 mg total) by mouth every 8 (eight) hours as needed. 12/24/21   Charlesetta Shanks, MD  medroxyPROGESTERone (DEPO-PROVERA) 150 MG/ML injection See admin instructions.    [provider]  methocarbamol (ROBAXIN) 750 MG tablet Take 1 tablet (750 mg total) by mouth 3 (three) times daily as needed (muscle spasm/pain). 12/24/21   Lajean Saver, MD  metoCLOPramide (REGLAN) 10 MG tablet Take 1 tablet (10 mg total) by mouth every 8 (eight) hours as  needed for nausea (Take with Benadryl for headache nausea.). 12/24/21   Charlesetta Shanks, MD  montelukast (SINGULAIR) 10 MG tablet Take 1 tablet (10 mg total) by mouth at bedtime. 07/03/21   Dorna Mai, MD  naproxen (NAPROSYN) 500 MG tablet Take 1 tablet (500 mg total) by mouth 2 (two) times daily. 06/03/21   Sherrill Raring, PA-C  sertraline (ZOLOFT) 25 MG tablet Take 1 tablet (25 mg total) by mouth daily. 04/22/21   Mayers, Cari S, PA-C      Allergies    Toradol [ketorolac tromethamine]    Review of Systems   Review of Systems  Physical Exam Updated Vital Signs BP 115/78 (BP Location: Left Arm)   Pulse 90   Temp 98.6 F (37 C) (Oral)   Resp 20   SpO2 100%  Physical Exam Constitutional:      Comments: Patient is alert nontoxic.  Mental status clear.  Uncomfortable in appearance normal mental status.  HENT:     Head: Normocephalic and atraumatic.     Right Ear: Tympanic membrane normal.     Left Ear: Tympanic membrane normal.     Nose: Nose normal.     Mouth/Throat:     Mouth: Mucous membranes are moist.     Pharynx: Oropharynx is clear.  Eyes:     Extraocular Movements: Extraocular movements intact.     Conjunctiva/sclera: Conjunctivae normal.  Pupils: Pupils are equal, round, and reactive to light.  Cardiovascular:     Rate and Rhythm: Normal rate and regular rhythm.  Pulmonary:     Effort: Pulmonary effort is normal.     Breath sounds: Normal breath sounds.  Abdominal:     General: There is no distension.     Palpations: Abdomen is soft.     Tenderness: There is no abdominal tenderness. There is no guarding.  Genitourinary:    Comments: Lymph node on the left.  Mobile about 1 cm.  No other inguinal lymphadenopathy. Musculoskeletal:        General: No swelling or tenderness. Normal range of motion.     Cervical back: Neck supple. No rigidity.     Right lower leg: No edema.     Left lower leg: No edema.  Lymphadenopathy:     Cervical: No cervical adenopathy.   Skin:    General: Skin is warm and dry.  Neurological:     General: No focal deficit present.     Mental Status: She is oriented to person, place, and time.     Cranial Nerves: No cranial nerve deficit.     Sensory: No sensory deficit.     Motor: No weakness.     Coordination: Coordination normal.  Psychiatric:     Comments: Mildly anxious.     ED Results / Procedures / Treatments   Labs (all labs ordered are listed, but only abnormal results are displayed) Labs Reviewed  CBC WITH DIFFERENTIAL/PLATELET - Abnormal; Notable for the following components:      Result Value   WBC 3.0 (*)    RBC 3.67 (*)    Hemoglobin 11.3 (*)    HCT 32.0 (*)    Lymphs Abs 0.6 (*)    All other components within normal limits  COMPREHENSIVE METABOLIC PANEL - Abnormal; Notable for the following components:   Glucose, Bld 102 (*)    Total Protein 9.3 (*)    Alkaline Phosphatase 32 (*)    All other components within normal limits  SEDIMENTATION RATE - Abnormal; Notable for the following components:   Sed Rate 33 (*)    All other components within normal limits  URINALYSIS, ROUTINE W REFLEX MICROSCOPIC - Abnormal; Notable for the following components:   Ketones, ur 15 (*)    All other components within normal limits  GROUP A STREP BY PCR  RESP PANEL BY RT-PCR (FLU A&B, COVID) ARPGX2  HCG, SERUM, QUALITATIVE  C-REACTIVE PROTEIN  MONONUCLEOSIS SCREEN  RPR  HIV ANTIBODY (ROUTINE TESTING W REFLEX)  GC/CHLAMYDIA PROBE AMP ([place]) NOT AT [facility]    EKG None  Radiology No results found.  Procedures Procedures    Medications Ordered in ED Medications  lactated ringers bolus 1,000 mL (0 mLs Intravenous Stopped 12/24/21 2346)  diphenhydrAMINE (BENADRYL) injection 25 mg (25 mg Intravenous Given 12/24/21 2117)  metoCLOPramide (REGLAN) injection 10 mg (10 mg Intravenous Given 12/24/21 2125)  acetaminophen (TYLENOL) tablet 1,000 mg (1,000 mg Oral Given 12/24/21 2122)  ibuprofen (ADVIL) tablet  800 mg (800 mg Oral Given 12/24/21 2249)    ED Course/ Medical Decision Making/ A&P                           Medical Decision Making Amount and/or Complexity of Data Reviewed Labs: ordered.  Risk OTC drugs. Prescription drug management.   Patient as outlined.  She had earlier today and diagnosed with musculoskeletal pain.  At this time she is complaining of headache.  She is comfortable in appearance but normal mental status and no focal neurologic symptoms.  Based on earlier presentation and now symptoms more associated with headache, consideration for possible infectious etiology.  Will obtain lab work including COVID and influenza testing, strep testing and metabolic panel and CBC.  Patient also pointed out a inguinal lymph node.  She has not been sexually active for several months.  Although less likely, will add HIV RPR and GC chlamydia.  Treatment initiated for pain control with Benadryl Reglan and fluid resuscitation.  After reassessment, patient reported headache had significantly improved.  We reviewed allergy to Toradol.  Patient reports she has taken ibuprofen without adverse reaction.  We will add an oral dose of ibuprofen.  23: 30 patient reports headache is much better.  She reports she feels comfortable and is able to rest now.  Reports she feels well enough to go home and would like to be discharged.  She is clinically well in appearance.  No distress.  Mental status clear.  Nontoxic appearance.  Patient is ambulating out of the emergency department with no distress.  Well in appearance.  Stable Gait, alert.        Final Clinical Impression(s) / ED Diagnoses Final diagnoses:  Bad headache    Rx / DC Orders ED Discharge Orders          Ordered    metoCLOPramide (REGLAN) 10 MG tablet  Every 8 hours PRN,   Status:  Discontinued        12/24/21 2335    diphenhydrAMINE (BENADRYL) 25 MG tablet  Every 6 hours PRN,   Status:  Discontinued        12/24/21 2335     ibuprofen (ADVIL) 600 MG tablet  Every 8 hours PRN,   Status:  Discontinued        12/24/21 2335    diphenhydrAMINE (BENADRYL) 25 MG tablet  Every 6 hours PRN        12/24/21 2342    ibuprofen (ADVIL) 600 MG tablet  Every 8 hours PRN        12/24/21 2342    metoCLOPramide (REGLAN) 10 MG tablet  Every 8 hours PRN        12/24/21 2342              Charlesetta Shanks, MD 12/26/21 2351

## 2021-12-24 NOTE — Discharge Instructions (Addendum)
It was our pleasure to provide your ER care today - we hope that you feel better.  Take acetaminophen or ibuprofen as need. You may also take robaxin as need for muscle pain/spasm - no driving when taking.  Follow up with primary care doctor in 1-2 weeks if symptoms fail to improve/resolve.  Return to ER if worse, new symptoms, fevers, trouble breathing, or other emergency concern.

## 2021-12-24 NOTE — ED Triage Notes (Signed)
States 2 nights ago had some tingling in bilateral legs. Yesterday started having headache, neck pain radiating down to lower back. Denies fever. Injured back last year at work.

## 2021-12-24 NOTE — ED Notes (Signed)
Pt c/o HA was seen here earlier today and no blood work etc was done.  States her HA is worse but has not gone to pick up her meds at pharmacy.

## 2021-12-24 NOTE — Telephone Encounter (Signed)
  Chief Complaint: Headache Symptoms: 10/10 headache "Entire head" since yesterday, radiates to neck, cannot touch chin to chest. Has tried multiple OTC  pain meds, heat, ineffective. Arms tingling yesterday, not presently Frequency: Yesterday Pertinent Negatives: Patient denies injury Disposition: [x] ED /[] Urgent Care (no appt availability in office) / [] Appointment(In office/virtual)/ []  [facility]/ [] Home Care/ [] Refused Recommended Disposition /[] [facility]/ []  Follow-up with PCP Additional Notes: Advised ED, pt states will follow disposition. Care advise provided, verbalizes understanding. Reason for Disposition  Stiff neck (can't touch chin to chest)  Answer Assessment - Initial Assessment Questions 1. LOCATION: "Where does it hurt?"      Entire head, neck 2. ONSET: "When did the headache start?" (Minutes, hours or days)      Yesterday 3. PATTERN: "Does the pain come and go, or has it been constant since it started?"     Constant 4. SEVERITY: "How bad is the pain?" and "What does it keep you from doing?"  (e.g., Scale 1-10; mild, moderate, or severe)   - MILD (1-3): doesn't interfere with normal activities    - MODERATE (4-7): interferes with normal activities or awakens from sleep    - SEVERE (8-10): excruciating pain, unable to do any normal activities        10/10 5. RECURRENT SYMPTOM: "Have you ever had headaches before?" If Yes, ask: "When was the last time?" and "What happened that time?"      Tension headaches 6. CAUSE: "What do you think is causing the headache?"     Unsure 7. MIGRAINE: "Have you been diagnosed with migraine headaches?" If Yes, ask: "Is this headache similar?"      no 8. HEAD INJURY: "Has there been any recent injury to the head?"      no 9. OTHER SYMPTOMS: "Do you have any other symptoms?" (fever, stiff neck, eye pain, sore throat, cold symptoms)     Tingling in arms yesterday, not presently.  Protocols used: [facility]

## 2021-12-24 NOTE — ED Notes (Signed)
Pt given the GC/Chlamydia swabs to self swab ok per RN Ranette

## 2021-12-24 NOTE — Discharge Instructions (Signed)
1.  You may take a combination of ibuprofen, Reglan and Benadryl for headache.  You may take these medications together every 8 hours if needed. 2.  Return to the emergency department if you develop a fever,  vomiting, imbalance or other concerning symptoms. 3.  Schedule a follow-up appoint with your doctor for recheck in the next 3 to 5 days.

## 2021-12-25 LAB — C-REACTIVE PROTEIN: CRP: 0.5 mg/dL (ref <1.0)

## 2021-12-25 LAB — HIV ANTIBODY (ROUTINE TESTING W REFLEX): HIV Screen 4th Generation wRfx: NONREACTIVE

## 2021-12-25 NOTE — Progress Notes (Signed)
Patient ID: Faith Ross, female    DOB: 1990/05/21  MRN: [id number]  CC: Emergency Department Follow-Up  Subjective: Faith Ross is a 31 y.o. female who presents for emergency department follow-up.  Her concerns today include:  12/24/2021 [facility] per MD note: Medical Decision Making Problems Addressed: Acute bilateral thoracic back pain: acute illness or injury Strain of trapezius muscle, unspecified laterality, initial encounter: acute illness or injury   Amount and/or Complexity of Data Reviewed External Data Reviewed: notes.   Risk OTC drugs. Prescription drug management.     Reviewed nursing notes and prior charts for additional history.    Pt took ibuprofen earlier.    Acetaminophen po.   Rx robaxin.   Pt currently appears stable for d/c.    Rec pcp f/u.   12/24/2021 [facility] per MD discharge instructions: You may take a combination of ibuprofen, Reglan and Benadryl for headache. You may take these medications together every 8 hours if needed.  Today's visit 12/26/2021: - Reports she has not taken Robaxin prescribed at emergency department discharge due to concern that dose it too high and nobody explained anything to her at the hospital about possible side effects. Today I discussed with patient the Robaxin prescription is as needed three times daily and the possible side effects. Reports today she purchased over-the-counter Excedrin to help with headaches but hasn't started yet. States she did not purchase Benadryl due to cost. Has not started Reglan as of yet. Expresses concerns about possible side effects of taking prescribed and over-the-counter medications. I discussed with patient in detail that she should be safe to take all medications except for do not take Robaxin, Reglan, and Benadryl together. Patient verbalized understanding.  - Endorses new onset bilateral breast pain x 2 weeks.  Intermittent. No additional symptoms. No family history of breast cancer. She does not have periods due to she takes Depo injections.  - No further issues or concerns for today.   Patient Active Problem List   Diagnosis Date Noted   GAD (generalized anxiety disorder) 04/23/2021   Urinary tract infection without hematuria 03/06/2021   Dental abscess 03/06/2021   Bacterial vaginitis 12/31/2020   Anxiety 12/02/2017   Chest pain 12/02/2017   Palpitations 12/02/2017     Current Outpatient Medications on File Prior to Visit  Medication Sig Dispense Refill   diphenhydrAMINE (BENADRYL) 25 MG tablet Take 1 tablet (25 mg total) by mouth every 6 (six) hours as needed. 30 tablet 0   hydrOXYzine (ATARAX) 10 MG tablet Take 1 tablet (10 mg total) by mouth 3 (three) times daily as needed. 60 tablet 1   ibuprofen (ADVIL) 600 MG tablet Take 1 tablet (600 mg total) by mouth every 8 (eight) hours as needed. 30 tablet 0   medroxyPROGESTERone (DEPO-PROVERA) 150 MG/ML injection See admin instructions.     methocarbamol (ROBAXIN) 750 MG tablet Take 1 tablet (750 mg total) by mouth 3 (three) times daily as needed (muscle spasm/pain). 15 tablet 0   metoCLOPramide (REGLAN) 10 MG tablet Take 1 tablet (10 mg total) by mouth every 8 (eight) hours as needed for nausea (Take with Benadryl for headache nausea.). 30 tablet 0   montelukast (SINGULAIR) 10 MG tablet Take 1 tablet (10 mg total) by mouth at bedtime. 30 tablet 1   naproxen (NAPROSYN) 500 MG tablet Take 1 tablet (500 mg total) by mouth 2 (two) times daily. 30 tablet 0   sertraline (ZOLOFT) 25 MG tablet Take  1 tablet (25 mg total) by mouth daily. 30 tablet 2   No current facility-administered medications on file prior to visit.    Allergies  Allergen Reactions   Toradol [Ketorolac Tromethamine] Anxiety    Social History   Socioeconomic History   Marital status: Single    Spouse name: Not on file   Number of children: Not on file   Years of education:  Not on file   Highest education level: Not on file  Occupational History   Not on file  Tobacco Use   Smoking status: Never   Smokeless tobacco: Never  Vaping Use   Vaping Use: Never used  Substance and Sexual Activity   Alcohol use: Not Currently   Drug use: Never   Sexual activity: Not Currently    Birth control/protection: None  Other Topics Concern   Not on file  Social History Narrative   Not on file   Social Determinants of Health   Financial Resource Strain: Not on file  Food Insecurity: Not on file  Transportation Needs: Not on file  Physical Activity: Not on file  Stress: Not on file  Social Connections: Not on file  Intimate Partner Violence: Not on file    Family History  Problem Relation Age of Onset   Thyroid disease Mother     Past Surgical History:  Procedure Laterality Date   TONSILLECTOMY      ROS: Review of Systems Negative except as stated above  PHYSICAL EXAM: BP 108/76   Pulse 93   Wt 116 lb (52.6 kg)   SpO2 99%   BMI 18.72 kg/m   Physical Exam HENT:     Head: Normocephalic and atraumatic.  Eyes:     Extraocular Movements: Extraocular movements intact.     Pupils: Pupils are equal, round, and reactive to light.  Cardiovascular:     Rate and Rhythm: Normal rate and regular rhythm.     Pulses: Normal pulses.     Heart sounds: Normal heart sounds.  Pulmonary:     Effort: Pulmonary effort is normal.     Breath sounds: Normal breath sounds.  Chest:  Breasts:    Right: Normal.     Left: Normal.     Comments: Margorie John, CMA present. Musculoskeletal:     Cervical back: Normal range of motion and neck supple.  Skin:    General: Skin is warm and dry.  Neurological:     General: No focal deficit present.     Mental Status: She is alert and oriented to person, place, and time.  Psychiatric:        Mood and Affect: Mood normal.        Behavior: Behavior normal.     ASSESSMENT AND PLAN: 1. Acute bilateral thoracic back  pain 2. Strain of trapezius muscle, unspecified laterality, subsequent encounter - Patient plans to begin Robaxin as prescribed. Discussed with patient adherence/adverse effects.  - Continue over-the-counter analgesics as needed.  - Follow-up with primary provider in 4 weeks or sooner if needed.   3. Bad headache - Patient reports she purchased over-the-counter Excedrin today and plans to begin today.  - Patient plans to begin Reglan as prescribed. Discussed with patient adherence/adverse effects.  - Follow-up with primary provider in 4 weeks or sooner if needed.   4. Breast pain - Digital diagnostic bilateral mammogram for further evaluation.  - MM Digital Diagnostic Bilat; Future   Patient was given the opportunity to ask questions.  Patient verbalized understanding of  the plan and was able to repeat key elements of the plan. Patient was given clear instructions to go to Emergency Department or return to medical center if symptoms don't improve, worsen, or new problems develop.The patient verbalized understanding.   Orders Placed This Encounter  Procedures   MM Digital Diagnostic Bilat     Return in about 4 weeks (around 01/23/2022) for Follow-Up or next available Georganna Skeans, MD .  Rema Fendt, NP

## 2021-12-26 ENCOUNTER — Ambulatory Visit (INDEPENDENT_AMBULATORY_CARE_PROVIDER_SITE_OTHER): Payer: Self-pay | Admitting: Family

## 2021-12-26 ENCOUNTER — Ambulatory Visit: Payer: Medicaid Other | Admitting: Family

## 2021-12-26 ENCOUNTER — Encounter: Payer: Self-pay | Admitting: Family

## 2021-12-26 VITALS — BP 108/76 | HR 93 | Wt 116.0 lb

## 2021-12-26 DIAGNOSIS — R519 Headache, unspecified: Secondary | ICD-10-CM

## 2021-12-26 DIAGNOSIS — S46819D Strain of other muscles, fascia and tendons at shoulder and upper arm level, unspecified arm, subsequent encounter: Secondary | ICD-10-CM

## 2021-12-26 DIAGNOSIS — N644 Mastodynia: Secondary | ICD-10-CM

## 2021-12-26 DIAGNOSIS — M546 Pain in thoracic spine: Secondary | ICD-10-CM

## 2021-12-26 LAB — GC/CHLAMYDIA PROBE AMP (~~LOC~~) NOT AT ARMC
Chlamydia: NEGATIVE
Comment: NEGATIVE
Comment: NORMAL
Neisseria Gonorrhea: NEGATIVE

## 2021-12-26 LAB — RPR: RPR Ser Ql: NONREACTIVE

## 2021-12-26 NOTE — Progress Notes (Signed)
Pt presents for neck pain and headache -started Wednesday  -pain radiates from head to neck  -nausea has returned  -pt states she has not started the muscle relaxant wanted to make sure it was ok to take because of high dosage

## 2022-01-03 ENCOUNTER — Other Ambulatory Visit: Payer: Self-pay | Admitting: Family

## 2022-01-03 DIAGNOSIS — N644 Mastodynia: Secondary | ICD-10-CM

## 2022-01-06 ENCOUNTER — Ambulatory Visit: Payer: Self-pay | Admitting: *Deleted

## 2022-01-06 NOTE — Telephone Encounter (Signed)
Summary: personal discomfort   The patient has experienced personal discomfort for roughly a month  The patient has experienced discharge but no odor or pain when urinating  Please contact the patient further when possible      Called patient to review sx. No answer, LVMTCB [phone].

## 2022-01-06 NOTE — Telephone Encounter (Signed)
  Chief Complaint: Vaginal Discharge, Itching Symptoms: Vaginal discharge "Thick, clear, sticky" x 1 month, Itching "Inside and out."  States has had yeast infections before "Doesn't seem like that." Frequency: 1 month Pertinent Negatives: Patient denies rash, pain,fever Disposition: [] ED /[] Urgent Care (no appt availability in office) / [] Appointment(In office/virtual)/ []  [place]/ [] Home Care/ [] Refused Recommended Disposition /[x] [place] [facility]/ []  Follow-up with PCP Additional Notes: Appt secured by agent prior to triage first available 01/13/22. "Would like to be seen sooner, itching is bad." [facility] today or tomorrow, information provided. States she will try to go today, will CB to cancel appt if she is seen. Reason for Disposition  [1] Symptoms of a "yeast infection" (i.e., itchy, white discharge, not bad smelling) AND [2] not improved > 3 days following Care Advice  Answer Assessment - Initial Assessment Questions 1. DISCHARGE: "Describe the discharge." (e.g., white, yellow, green, gray, foamy, cottage cheese-like)     Thick sticky, clear 2. ODOR: "Is there a bad odor?"     no 3. ONSET: "When did the discharge begin?"     1 month 4. RASH: "Is there a rash in the genital area?" If Yes, ask: "Describe it." (e.g., redness, blisters, sores, bumps)     No 5. ABDOMEN PAIN: "Are you having any abdomen pain?" If Yes, ask: "What does it feel like? " (e.g., crampy, dull, intermittent, constant)      No 6. ABDOMEN PAIN SEVERITY: If present, ask: "How bad is it?" (e.g., Scale 1-10; mild, moderate, or severe)   - MILD (1-3): Doesn't interfere with normal activities, abdomen soft and not tender to touch.    - MODERATE (4-7): Interferes with normal activities or awakens from sleep, abdomen tender to touch.    - SEVERE (8-10): Excruciating pain, doubled over, unable to do any normal activities. (R/O peritonitis)      No 7. CAUSE: "What do you think is causing  the discharge?" "Have you had the same problem before? What happened then?"     Unsure 8. OTHER SYMPTOMS: "Do you have any other symptoms?" (e.g., fever, itching, vaginal bleeding, pain with urination, injury to genital area, vaginal foreign body)     Itching vaginal area  Protocols used: Vaginal Discharge-A-AH

## 2022-01-07 ENCOUNTER — Ambulatory Visit (INDEPENDENT_AMBULATORY_CARE_PROVIDER_SITE_OTHER): Payer: Self-pay | Admitting: Family

## 2022-01-07 DIAGNOSIS — N898 Other specified noninflammatory disorders of vagina: Secondary | ICD-10-CM

## 2022-01-07 DIAGNOSIS — N760 Acute vaginitis: Secondary | ICD-10-CM

## 2022-01-07 DIAGNOSIS — B3731 Acute candidiasis of vulva and vagina: Secondary | ICD-10-CM

## 2022-01-07 DIAGNOSIS — B9689 Other specified bacterial agents as the cause of diseases classified elsewhere: Secondary | ICD-10-CM

## 2022-01-07 MED ORDER — METRONIDAZOLE 0.75 % EX GEL: 1.0000 | Freq: Every day | CUTANEOUS | 0 refills | Status: AC

## 2022-01-07 MED ORDER — FLUCONAZOLE 150 MG PO TABS: 150.0000 mg | ORAL_TABLET | Freq: Once | ORAL | 0 refills | Status: AC

## 2022-01-07 NOTE — Progress Notes (Deleted)
Patient ID: Faith Ross, female    DOB: 09-27-1990  MRN: [id number]  CC: Annual Physical Exam  Subjective: Faith Ross is a 31 y.o. female who presents for annual physical exam.  Her concerns today include:    Patient Active Problem List   Diagnosis Date Noted   GAD (generalized anxiety disorder) 04/23/2021   Urinary tract infection without hematuria 03/06/2021   Dental abscess 03/06/2021   Bacterial vaginitis 12/31/2020   Anxiety 12/02/2017   Chest pain 12/02/2017   Palpitations 12/02/2017     Current Outpatient Medications on File Prior to Visit  Medication Sig Dispense Refill   diphenhydrAMINE (BENADRYL) 25 MG tablet Take 1 tablet (25 mg total) by mouth every 6 (six) hours as needed. 30 tablet 0   fluconazole (DIFLUCAN) 150 MG tablet Take 1 tablet (150 mg total) by mouth once for 1 dose. 1 tablet 0   hydrOXYzine (ATARAX) 10 MG tablet Take 1 tablet (10 mg total) by mouth 3 (three) times daily as needed. 60 tablet 1   ibuprofen (ADVIL) 600 MG tablet Take 1 tablet (600 mg total) by mouth every 8 (eight) hours as needed. 30 tablet 0   medroxyPROGESTERone (DEPO-PROVERA) 150 MG/ML injection See admin instructions.     methocarbamol (ROBAXIN) 750 MG tablet Take 1 tablet (750 mg total) by mouth 3 (three) times daily as needed (muscle spasm/pain). 15 tablet 0   metoCLOPramide (REGLAN) 10 MG tablet Take 1 tablet (10 mg total) by mouth every 8 (eight) hours as needed for nausea (Take with Benadryl for headache nausea.). 30 tablet 0   metroNIDAZOLE (METROGEL) 0.75 % gel Apply 1 Application topically at bedtime for 5 days. 5 g 0   montelukast (SINGULAIR) 10 MG tablet Take 1 tablet (10 mg total) by mouth at bedtime. 30 tablet 1   naproxen (NAPROSYN) 500 MG tablet Take 1 tablet (500 mg total) by mouth 2 (two) times daily. 30 tablet 0   sertraline (ZOLOFT) 25 MG tablet Take 1 tablet (25 mg total) by mouth daily. 30 tablet 2   No current facility-administered medications on file prior  to visit.    Allergies  Allergen Reactions   Toradol [Ketorolac Tromethamine] Anxiety    Social History   Socioeconomic History   Marital status: Single    Spouse name: Not on file   Number of children: Not on file   Years of education: Not on file   Highest education level: Not on file  Occupational History   Not on file  Tobacco Use   Smoking status: Never   Smokeless tobacco: Never  Vaping Use   Vaping Use: Never used  Substance and Sexual Activity   Alcohol use: Not Currently   Drug use: Never   Sexual activity: Not Currently    Birth control/protection: None  Other Topics Concern   Not on file  Social History Narrative   Not on file   Social Determinants of Health   Financial Resource Strain: Not on file  Food Insecurity: Not on file  Transportation Needs: Not on file  Physical Activity: Not on file  Stress: Not on file  Social Connections: Not on file  Intimate Partner Violence: Not on file    Family History  Problem Relation Age of Onset   Thyroid disease Mother     Past Surgical History:  Procedure Laterality Date   TONSILLECTOMY      ROS: Review of Systems Negative except as stated above  PHYSICAL EXAM: There were no vitals taken  for this visit.  Physical Exam  {female adult master:310786} {female adult master:310785}     Latest Ref Rng & Units 12/24/2021    9:16 PM 06/03/2021    9:38 PM 05/31/2021    5:32 PM  CMP  Glucose 70 - 99 mg/dL 364  93  680   BUN 6 - 20 mg/dL 8  9  7    Creatinine 0.44 - 1.00 mg/dL  3.21  2.24   Sodium 135 - 145 mmol/L 137  138  137   Potassium 3.5 - 5.1 mmol/L 3.6  3.5  3.1   Chloride 98 - 111 mmol/L 107  109  108   CO2 22 - 32 mmol/L 24  22  25    Calcium 8.9 - 10.3 mg/dL 9.3  9.3  9.0   Total Protein 6.5 - 8.1 g/dL 9.3     Total Bilirubin 0.3 - 1.2 mg/dL 0.4     Alkaline Phos 38 - 126 U/L 32     AST 15 - 41 U/L 19     ALT 0 - 44 U/L 20      Lipid Panel     Component Value Date/Time   CHOL 132  06/05/2019 1630   TRIG 42 06/05/2019 1630   HDL 45 06/05/2019 1630   CHOLHDL 2.9 06/05/2019 1630   LDLCALC 77 06/05/2019 1630    CBC    Component Value Date/Time   WBC 3.0 (L) 12/24/2021 2116   RBC 3.67 (L) 12/24/2021 2116   HGB 11.3 (L) 12/24/2021 2116   HGB 11.6 11/13/2021 1020   HCT 32.0 (L) 12/24/2021 2116   HCT 34.3 11/13/2021 1020   PLT 244 12/24/2021 2116   PLT 245 11/13/2021 1020   MCV 87.2 12/24/2021 2116   MCV 88 11/13/2021 1020   MCH 30.8 12/24/2021 2116   MCHC 35.3 12/24/2021 2116   RDW 12.2 12/24/2021 2116   RDW 11.9 11/13/2021 1020   LYMPHSABS 0.6 (L) 12/24/2021 2116   LYMPHSABS 1.1 11/13/2021 1020   MONOABS 0.1 12/24/2021 2116   EOSABS 0.0 12/24/2021 2116   EOSABS 0.0 11/13/2021 1020   BASOSABS 0.0 12/24/2021 2116   BASOSABS 0.0 11/13/2021 1020    ASSESSMENT AND PLAN:  There are no diagnoses linked to this encounter.   Patient was given the opportunity to ask questions.  Patient verbalized understanding of the plan and was able to repeat key elements of the plan. Patient was given clear instructions to go to Emergency Department or return to medical center if symptoms don't improve, worsen, or new problems develop.The patient verbalized understanding.   No orders of the defined types were placed in this encounter.    Requested Prescriptions    No prescriptions requested or ordered in this encounter    No follow-ups on file.  2117, NP

## 2022-01-07 NOTE — Progress Notes (Signed)
Virtual Visit via Telephone Note  I connected with Lena Gores, on 01/07/2022 at 8:05 AM by telephone and verified that I am speaking with the correct person using two identifiers.  Consent: I discussed the limitations, risks, security and privacy concerns of performing an evaluation and management service by telephone and the availability of in person appointments. I also discussed with the patient that there may be a patient responsible charge related to this service. The patient expressed understanding and agreed to proceed.   Location of Patient: Home  Location of Provider: [facility]   Persons participating in Telemedicine visit: Aryaa Venn Ricky Stabs, NP Margorie John, CMA   History of Present Illness: Faith Ross is a 31 year-old female who presents for vaginal discharge.  01/06/2022 per triage RN note:     Summary: personal discomfort     The patient has experienced personal discomfort for roughly a month  The patient has experienced discharge but no odor or pain when urinating  Please contact the patient further when possible          Chief Complaint: Vaginal Discharge, Itching Symptoms: Vaginal discharge "Thick, clear, sticky" x 1 month, Itching "Inside and out."  States has had yeast infections before "Doesn't seem like that." Frequency: 1 month Pertinent Negatives: Patient denies rash, pain,fever Disposition: [] ED /[] Urgent Care (no appt availability in office) / [] Appointment(In office/virtual)/ []  [place] [facility]/ [] Home Care/ [] Refused Recommended Disposition /[x] [place] [facility]/ []  Follow-up with PCP Additional Notes: Appt secured by agent prior to triage first available 01/13/22. "Would like to be seen sooner, itching is bad." [facility] today or tomorrow, information provided. States she will try to go today, will CB to cancel appt if she is seen. Reason for Disposition  [1] Symptoms  of a "yeast infection" (i.e., itchy, white discharge, not bad smelling) AND [2] not improved > 3 days following Care Advice  Answer Assessment - Initial Assessment Questions 1. DISCHARGE: "Describe the discharge." (e.g., white, yellow, green, gray, foamy, cottage cheese-like)     Thick sticky, clear 2. ODOR: "Is there a bad odor?"     no 3. ONSET: "When did the discharge begin?"     1 month 4. RASH: "Is there a rash in the genital area?" If Yes, ask: "Describe it." (e.g., redness, blisters, sores, bumps)     No 5. ABDOMEN PAIN: "Are you having any abdomen pain?" If Yes, ask: "What does it feel like? " (e.g., crampy, dull, intermittent, constant)      No 6. ABDOMEN PAIN SEVERITY: If present, ask: "How bad is it?" (e.g., Scale 1-10; mild, moderate, or severe)   - MILD (1-3): Doesn't interfere with normal activities, abdomen soft and not tender to touch.    - MODERATE (4-7): Interferes with normal activities or awakens from sleep, abdomen tender to touch.    - SEVERE (8-10): Excruciating pain, doubled over, unable to do any normal activities. (R/O peritonitis)      No 7. CAUSE: "What do you think is causing the discharge?" "Have you had the same problem before? What happened then?"     Unsure 8. OTHER SYMPTOMS: "Do you have any other symptoms?" (e.g., fever, itching, vaginal bleeding, pain with urination, injury to genital area, vaginal foreign body)     Itching vaginal area  Today's visit 01/07/2022: Reports history of bacterial vaginitis and taking Metronidazole tablets which is causing bad taste in mouth. Would like to try Metronidazole gel instead. Not ready  for referral to Gynecology presently. States she is due for her annual exam and Depo injection and aware to schedule an in-person appointment for the same. No further issues/concerns today.   Past Medical History:  Diagnosis Date   Alopecia    Anxiety    Chest wall pain    Iron deficiency anemia    Allergies  Allergen  Reactions   Toradol [Ketorolac Tromethamine] Anxiety    Current Outpatient Medications on File Prior to Visit  Medication Sig Dispense Refill   diphenhydrAMINE (BENADRYL) 25 MG tablet Take 1 tablet (25 mg total) by mouth every 6 (six) hours as needed. 30 tablet 0   hydrOXYzine (ATARAX) 10 MG tablet Take 1 tablet (10 mg total) by mouth 3 (three) times daily as needed. 60 tablet 1   ibuprofen (ADVIL) 600 MG tablet Take 1 tablet (600 mg total) by mouth every 8 (eight) hours as needed. 30 tablet 0   medroxyPROGESTERone (DEPO-PROVERA) 150 MG/ML injection See admin instructions.     methocarbamol (ROBAXIN) 750 MG tablet Take 1 tablet (750 mg total) by mouth 3 (three) times daily as needed (muscle spasm/pain). 15 tablet 0   metoCLOPramide (REGLAN) 10 MG tablet Take 1 tablet (10 mg total) by mouth every 8 (eight) hours as needed for nausea (Take with Benadryl for headache nausea.). 30 tablet 0   montelukast (SINGULAIR) 10 MG tablet Take 1 tablet (10 mg total) by mouth at bedtime. 30 tablet 1   naproxen (NAPROSYN) 500 MG tablet Take 1 tablet (500 mg total) by mouth 2 (two) times daily. 30 tablet 0   sertraline (ZOLOFT) 25 MG tablet Take 1 tablet (25 mg total) by mouth daily. 30 tablet 2   No current facility-administered medications on file prior to visit.    Observations/Objective: Alert and oriented x 3. Not in acute distress. Physical examination not completed as this is a telemedicine visit.  Assessment and Plan: 1. Bacterial vaginitis 2. Candida vaginitis 3. Vaginal itching 4. Vaginal discharge - Metronidazole gel as prescribed. Counseled on medication adherence.  - Fluconazole as prescribed. Counseled on medication adherence. - Follow-up with primary provider as scheduled.  - metroNIDAZOLE (METROGEL) 0.75 % gel; Apply 1 Application topically at bedtime for 5 days.  Dispense: 5 g; Refill: 0 - fluconazole (DIFLUCAN) 150 MG tablet; Take 1 tablet (150 mg total) by mouth once for 1 dose.   Dispense: 1 tablet; Refill: 0   Follow Up Instructions: Return for annual physical exam and Depo injection. Follow-up with primary provider as scheduled.   Patient was given clear instructions to go to Emergency Department or return to medical center if symptoms don't improve, worsen, or new problems develop.The patient verbalized understanding.  I discussed the assessment and treatment plan with the patient. The patient was provided an opportunity to ask questions and all were answered. The patient agreed with the plan and demonstrated an understanding of the instructions.   The patient was advised to call back or seek an in-person evaluation if the symptoms worsen or if the condition fails to improve as anticipated.     I provided 5 minutes total of non-face-to-face time during this encounter.   Rema Fendt, NP  [facility] at [facility] [place], [place] [phone] 01/07/2022, 8:05 AM

## 2022-01-08 ENCOUNTER — Encounter: Payer: Self-pay | Admitting: Family

## 2022-01-08 ENCOUNTER — Ambulatory Visit: Payer: Self-pay

## 2022-01-12 ENCOUNTER — Ambulatory Visit: Payer: Self-pay | Admitting: *Deleted

## 2022-01-12 NOTE — Telephone Encounter (Signed)
Message from Randol Kern sent at 01/12/2022 12:22 PM EST  Summary: Personal/Woman's issue   Pt called requesting to speak to a nurse, says this is regarding a personal woman's issue.  Best contact: [phone]          Call History   Type Contact Phone/Fax User  01/12/2022 12:21 PM EST Phone (Incoming) Ross, Faith (Self) [phone] Judie Petit) Eason, Dominiqu   Reason for Disposition  [1] Vaginal itching AND [2] not improved > 3 days following Care Advice  Answer Assessment - Initial Assessment Questions 1. SYMPTOM: "What's the main symptom you're concerned about?" (e.g., pain, itching, dryness)     I'm itching and irritated on the outside and inside of my vaginal area.   Can she prescribe something for it?  I did a video visit with Amy (11/15) and was prescribed the Diflucan and Metrogel.    It's still very itchy inside and outside.    No discharge now.   It's burning from where I scratch so much. I'm burning and itching inside.    I have Monostat that I'm using because I haven't been able to afford to pick up the Metrogel yet.  It's not really helping much.   I get paid this week so I'll be able to get it then.  2. LOCATION: "Where is the  itching located?" (e.g., inside/outside, left/right)     Inside and outside of my vaginal area. 3. ONSET: "When did the  itching  start?"     I am already being treated for it but it still itches real bad. 4. PAIN: "Is there any pain?" If Yes, ask: "How bad is it?" (Scale: 1-10; mild, moderate, severe)   -  MILD (1-3): Doesn't interfere with normal activities.    -  MODERATE (4-7): Interferes with normal activities (e.g., work or school) or awakens from sleep.     -  SEVERE (8-10): Excruciating pain, unable to do any normal activities.     Burns. 5. ITCHING: "Is there any itching?" If Yes, ask: "How bad is it?" (Scale: 1-10; mild, moderate, severe)     Yes itching bad inside and outside of my vaginal area. 6. CAUSE: "What do you think is  causing the discharge?" "Have you had the same problem before? What happened then?"     Infection  7. OTHER SYMPTOMS: "Do you have any other symptoms?" (e.g., fever, itching, vaginal bleeding, pain with urination, injury to genital area, vaginal foreign body)     Denies discharge or odor. 8. PREGNANCY: "Is there any chance you are pregnant?" "When was your last menstrual period?"     Not asked  Protocols used: Vaginal Symptoms-A-AH

## 2022-01-12 NOTE — Telephone Encounter (Signed)
  Chief Complaint: Continued itching and irritation around and inside her vaginal area. Symptoms: Itching not improved.   (Video visit done 11/15 diagnosed with BV). Frequency: Not getting better except no discharge or odor now Pertinent Negatives: Patient denies The itching getting any better. Disposition: [] ED /[] Urgent Care (no appt availability in office) / [] Appointment(In office/virtual)/ []  [facility]/ [] Home Care/ [] Refused Recommended Disposition /[] [facility]/ [x]  Follow-up with PCP Additional Notes: I have sent a note to , NP letting her know of your continued symptoms.   Someone will give you a call back.  Pt. Agreeable to this plan.

## 2022-01-13 ENCOUNTER — Ambulatory Visit: Payer: Self-pay | Admitting: Family

## 2022-01-20 ENCOUNTER — Ambulatory Visit: Payer: Self-pay

## 2022-01-20 NOTE — Telephone Encounter (Signed)
  Chief Complaint: anxiety Symptoms: anxious, on edge, overwhelmed, Lightheaded, chest pain and tightness, hyperventilating Frequency: 2 weeks  Pertinent Negatives: Patient denies SI or HI Disposition: [] ED /[] Urgent Care (no appt availability in office) / [x] Appointment(In office/virtual)/ []  [facility]/ [] Home Care/ [] Refused Recommended Disposition /[x] [facility]/ []  Follow-up with PCP Additional Notes: pt states that her anxiety has just gotten worse over past 2 weeks. Nothing specific has happened that she feels caused this. Has stopped taking sertraline about 1 month ago because she felt better and stopped abruptly. Pt states EMS called at store last night because she felt so bad and they advised her to start back on sertraline and to FU with PCP. Advised [facility] is available today if she wanted to go see Cari, PA or could wait for appt on Thurs. With Amy, NP. Pt states she will try and go to [facility] today. Advised pt on medications and recommended starting back on sertraline but recommended not stopping once feeling better or stopping abruptly d/t SE. Pt verbalized understanding.   Reason for Disposition  MODERATE anxiety (e.g., persistent or frequent anxiety symptoms; interferes with sleep, school, or work)  Answer Assessment - Initial Assessment Questions 1. CONCERN: "Did anything happen that prompted you to call today?"      Increased in anxiety  2. ANXIETY SYMPTOMS: "Can you describe how you (your loved one; patient) have been feeling?" (e.g., tense, restless, panicky, anxious, keyed up, overwhelmed, sense of impending doom).      On edge, anxious, overwhelmed  3. ONSET: "How long have you been feeling this way?" (e.g., hours, days, weeks)     2 weeks 4. SEVERITY: "How would you rate the level of anxiety?" (e.g., 0 - 10; or mild, moderate, severe).     Moderate  5. FUNCTIONAL IMPAIRMENT: "How have these feelings affected your ability to do daily activities?" "Have you  had more difficulty than usual doing your normal daily activities?" (e.g., getting better, same, worse; self-care, school, work, interactions)     Some  7. RISK OF HARM - SUICIDAL IDEATION: "Do you ever have thoughts of hurting or killing yourself?" If Yes, ask:  "Do you have these feelings now?" "Do you have a plan on how you would do this?"     no 8. TREATMENT:  "What has been done so far to treat this anxiety?" (e.g., medicines, relaxation strategies). "What has helped?"     Relaxation, music, drinking tea, hot bath, sometimes helps  9. TREATMENT - THERAPIST: "Do you have a counselor or therapist? Name?"     No  11. PATIENT SUPPORT: "Who is with you now?" "Who do you live with?" "Do you have family or friends who you can talk to?"        Family and friends  12. OTHER SYMPTOMS: "Do you have any other symptoms?" (e.g., feeling depressed, trouble concentrating, trouble sleeping, trouble breathing, palpitations or fast heartbeat, chest pain, sweating, nausea, or diarrhea)       Lightheaded, chest pain and tightness, hyperventilating  Protocols used: Anxiety and Panic Attack-A-AH

## 2022-01-20 NOTE — Progress Notes (Unsigned)
Patient ID: Faith Ross, female    DOB: Mar 24, 1990  MRN: [id number]  CC: Anxiety Follow-Up  Subjective: Faith Ross is a 31 y.o. female who presents anxiety follow-up.   Her concerns today include:  01/20/2022 per triage RN note: Chief Complaint: anxiety Symptoms: anxious, on edge, overwhelmed, Lightheaded, chest pain and tightness, hyperventilating Frequency: 2 weeks  Pertinent Negatives: Patient denies SI or HI Disposition: [] ED /[] Urgent Care (no appt availability in office) / [x] Appointment(In office/virtual)/ []  [facility]/ [] Home Care/ [] Refused Recommended Disposition /[x] [facility]/ []  Follow-up with PCP Additional Notes: pt states that her anxiety has just gotten worse over past 2 weeks. Nothing specific has happened that she feels caused this. Has stopped taking sertraline about 1 month ago because she felt better and stopped abruptly. Pt states EMS called at store last night because she felt so bad and they advised her to start back on sertraline and to FU with PCP. Advised [facility] is available today if she wanted to go see Cari, PA or could wait for appt on Thurs. With Anola Mcgough, NP. Pt states she will try and go to [facility] today. Advised pt on medications and recommended starting back on sertraline but recommended not stopping once feeling better or stopping abruptly d/t SE. Pt verbalized understanding.    Reason for Disposition  MODERATE anxiety (e.g., persistent or frequent anxiety symptoms; interferes with sleep, school, or work)  Answer Assessment - Initial Assessment Questions 1. CONCERN: "Did anything happen that prompted you to call today?"      Increased in anxiety  2. ANXIETY SYMPTOMS: "Can you describe how you (your loved one; patient) have been feeling?" (e.g., tense, restless, panicky, anxious, keyed up, overwhelmed, sense of impending doom).      On edge, anxious, overwhelmed  3. ONSET: "How long have you been feeling this way?" (e.g., hours,  days, weeks)     2 weeks 4. SEVERITY: "How would you rate the level of anxiety?" (e.g., 0 - 10; or mild, moderate, severe).     Moderate  5. FUNCTIONAL IMPAIRMENT: "How have these feelings affected your ability to do daily activities?" "Have you had more difficulty than usual doing your normal daily activities?" (e.g., getting better, same, worse; self-care, school, work, interactions)     Some  7. RISK OF HARM - SUICIDAL IDEATION: "Do you ever have thoughts of hurting or killing yourself?" If Yes, ask:  "Do you have these feelings now?" "Do you have a plan on how you would do this?"     no 8. TREATMENT:  "What has been done so far to treat this anxiety?" (e.g., medicines, relaxation strategies). "What has helped?"     Relaxation, music, drinking tea, hot bath, sometimes helps  9. TREATMENT - THERAPIST: "Do you have a counselor or therapist? Name?"     No  11. PATIENT SUPPORT: "Who is with you now?" "Who do you live with?" "Do you have family or friends who you can talk to?"        Family and friends  12. OTHER SYMPTOMS: "Do you have any other symptoms?" (e.g., feeling depressed, trouble concentrating, trouble sleeping, trouble breathing, palpitations or fast heartbeat, chest pain, sweating, nausea, or diarrhea)       Lightheaded, chest pain and tightness, hyperventilating   Today's visit 01/22/2022: - Reports recently began taking Sertraline again. States in the past when she was taking Sertraline she did notice improvement and felt she no longer needed to take the medication. Reports primary  cause of anxiety depression varies. States "I have a lot of triggers." Reports stress, large crowds, and her job contribute to anxiety depression. She did establish with Reginia Naas, LCSWA for counseling services. Reports she was told during her last visit that the LCSWA is only allowed to complete a certain amount of visits and therefore she would need to be referred to a psychiatrist. Patient states that  she has Medicaid coverage which begins on tomorrow. She is ready for referral to a psychiatrist. She does have supportive family and friends but feels she overwhelms them sometimes. Denies thoughts of self-harm, suicidal ideations, and homicidal ideations.  - She is due for her Depo Provera injection today.  - Has what appears to be a small red bruise or possible rash of left lower extremity medial aspect. Denies red flag symptoms. Noticed yesterday during bedtime. Does not recall hitting her leg. Discussed with patient to monitor at home and if symptoms worsen/become severe to seek immediate medical evaluation, patient agreeable.      12/26/2021    1:33 PM 11/06/2021    4:24 PM 09/22/2021    3:54 PM 07/03/2021    9:38 AM 04/22/2021   11:16 AM  Depression screen PHQ 2/9  Decreased Interest 0 0 0 0 0  Down, Depressed, Hopeless 1 1 1  0 0  PHQ - 2 Score 1 1 1  0 0  Altered sleeping 1 1 1 1 1   Tired, decreased energy 1 1 1  0 1  Change in appetite 0 0 0 0 0  Feeling bad or failure about yourself  0 0 1 0 0  Trouble concentrating 0 0 0 0 0  Moving slowly or fidgety/restless 0 0 0 0 0  Suicidal thoughts 0 0 0 0 0  PHQ-9 Score 3 3 4 1 2   Difficult doing work/chores Not difficult at all Not difficult at all Not difficult at all Not difficult at all     Patient Active Problem List   Diagnosis Date Noted   Alopecia areata universalis 01/22/2022   Seborrheic dermatitis 01/22/2022   GAD (generalized anxiety disorder) 04/23/2021   Urinary tract infection without hematuria 03/06/2021   Dental abscess 03/06/2021   Bacterial vaginitis 12/31/2020   Chronic nasal congestion 07/25/2019   Deviated nasal septum 07/25/2019   Nasal turbinate hypertrophy 07/25/2019   Anxiety 12/02/2017   Chest pain 12/02/2017   Palpitations 12/02/2017     Current Outpatient Medications on File Prior to Visit  Medication Sig Dispense Refill   diphenhydrAMINE (BENADRYL) 25 MG tablet Take 1 tablet (25 mg total) by mouth  every 6 (six) hours as needed. 30 tablet 0   hydrOXYzine (ATARAX) 10 MG tablet Take 1 tablet (10 mg total) by mouth 3 (three) times daily as needed. 60 tablet 1   ibuprofen (ADVIL) 600 MG tablet Take 1 tablet (600 mg total) by mouth every 8 (eight) hours as needed. 30 tablet 0   medroxyPROGESTERone (DEPO-PROVERA) 150 MG/ML injection See admin instructions.     methocarbamol (ROBAXIN) 750 MG tablet Take 1 tablet (750 mg total) by mouth 3 (three) times daily as needed (muscle spasm/pain). 15 tablet 0   metoCLOPramide (REGLAN) 10 MG tablet Take 1 tablet (10 mg total) by mouth every 8 (eight) hours as needed for nausea (Take with Benadryl for headache nausea.). 30 tablet 0   montelukast (SINGULAIR) 10 MG tablet Take 1 tablet (10 mg total) by mouth at bedtime. 30 tablet 1   naproxen (NAPROSYN) 500 MG tablet Take 1 tablet (  500 mg total) by mouth 2 (two) times daily. 30 tablet 0   No current facility-administered medications on file prior to visit.    Allergies  Allergen Reactions   Toradol [Ketorolac Tromethamine] Anxiety    Social History   Socioeconomic History   Marital status: Single    Spouse name: Not on file   Number of children: Not on file   Years of education: Not on file   Highest education level: Not on file  Occupational History   Not on file  Tobacco Use   Smoking status: Never   Smokeless tobacco: Never  Vaping Use   Vaping Use: Never used  Substance and Sexual Activity   Alcohol use: Not Currently   Drug use: Never   Sexual activity: Not Currently    Birth control/protection: None  Other Topics Concern   Not on file  Social History Narrative   Not on file   Social Determinants of Health   Financial Resource Strain: Not on file  Food Insecurity: Not on file  Transportation Needs: Not on file  Physical Activity: Not on file  Stress: Not on file  Social Connections: Not on file  Intimate Partner Violence: Not on file    Family History  Problem Relation Age  of Onset   Thyroid disease Mother     Past Surgical History:  Procedure Laterality Date   TONSILLECTOMY      ROS: Review of Systems Negative except as stated above  PHYSICAL EXAM: BP 104/68 (BP Location: Left Arm, Patient Position: Sitting, Cuff Size: Normal)   Pulse 79   Temp 98.3 F (36.8 C)   Resp 16   Wt 120 lb (54.4 kg)   SpO2 99%   BMI 19.37 kg/m   Physical Exam HENT:     Head: Normocephalic and atraumatic.  Eyes:     Extraocular Movements: Extraocular movements intact.     Conjunctiva/sclera: Conjunctivae normal.     Pupils: Pupils are equal, round, and reactive to light.  Cardiovascular:     Rate and Rhythm: Normal rate and regular rhythm.     Pulses: Normal pulses.     Heart sounds: Normal heart sounds.  Pulmonary:     Effort: Pulmonary effort is normal.     Breath sounds: Normal breath sounds.  Musculoskeletal:     Cervical back: Normal range of motion and neck supple.  Neurological:     General: No focal deficit present.     Mental Status: She is alert and oriented to person, place, and time.  Psychiatric:        Mood and Affect: Mood normal.        Behavior: Behavior normal.    ASSESSMENT AND PLAN: 1. Anxiety and depression - Patient denies thoughts of self-harm, suicidal ideations, homicidal ideations. - Continue Sertraline as prescribed. Discussed with patient in the future she should receive medical advisement from a provider prior to making medication adjustments herself. Patient verbalized understanding.  - Referral to Psychiatry for further evaluation and management. During the interim patient will follow-up with me as needed until established with Psychiatry.  - sertraline (ZOLOFT) 25 MG tablet; Take 1 tablet (25 mg total) by mouth daily.  Dispense: 30 tablet; Refill: 2 - Ambulatory referral to Psychiatry  2. Encounter for Depo-Provera contraception - Administered.  - Return in 12 weeks.  - medroxyPROGESTERone (DEPO-PROVERA) injection 150  mg   Patient was given the opportunity to ask questions.  Patient verbalized understanding of the plan and was able  to repeat key elements of the plan. Patient was given clear instructions to go to Emergency Department or return to medical center if symptoms don't improve, worsen, or new problems develop.The patient verbalized understanding.   Orders Placed This Encounter  Procedures   Ambulatory referral to Psychiatry    Requested Prescriptions   Signed Prescriptions Disp Refills   sertraline (ZOLOFT) 25 MG tablet 30 tablet 2    Sig: Take 1 tablet (25 mg total) by mouth daily.    Follow-up with primary provider as scheduled.   Rema Fendt, NP

## 2022-01-22 ENCOUNTER — Ambulatory Visit (INDEPENDENT_AMBULATORY_CARE_PROVIDER_SITE_OTHER): Payer: Self-pay | Admitting: Family

## 2022-01-22 ENCOUNTER — Telehealth: Payer: Self-pay | Admitting: Family

## 2022-01-22 VITALS — BP 104/68 | HR 79 | Temp 98.3°F | Resp 16 | Wt 120.0 lb

## 2022-01-22 DIAGNOSIS — F411 Generalized anxiety disorder: Secondary | ICD-10-CM

## 2022-01-22 DIAGNOSIS — F32A Depression, unspecified: Secondary | ICD-10-CM

## 2022-01-22 DIAGNOSIS — L631 Alopecia universalis: Secondary | ICD-10-CM | POA: Insufficient documentation

## 2022-01-22 DIAGNOSIS — L219 Seborrheic dermatitis, unspecified: Secondary | ICD-10-CM | POA: Insufficient documentation

## 2022-01-22 DIAGNOSIS — Z3042 Encounter for surveillance of injectable contraceptive: Secondary | ICD-10-CM

## 2022-01-22 DIAGNOSIS — F419 Anxiety disorder, unspecified: Secondary | ICD-10-CM

## 2022-01-22 MED ORDER — SERTRALINE HCL 25 MG PO TABS: 25.0000 mg | ORAL_TABLET | Freq: Every day | ORAL | 2 refills | Status: DC

## 2022-01-22 MED ORDER — MEDROXYPROGESTERONE ACETATE 150 MG/ML IM SUSP
150.0000 mg | Freq: Once | INTRAMUSCULAR | Status: AC
  Administered 2022-01-22: 150 mg via INTRAMUSCULAR

## 2022-01-22 NOTE — Progress Notes (Signed)
Pt presents for anxiety and depression  -pt states that sessions with Reginia Naas LSCW has been completed and she has to find psychiatry self-pay pt -pt seems to have stopped Sertraline and resumed after speaking w/triage  -Depo-provera injection completed at visit

## 2022-01-23 ENCOUNTER — Ambulatory Visit: Payer: Self-pay | Admitting: Family

## 2022-02-11 NOTE — Progress Notes (Signed)
Patient ID: Faith Ross, female    DOB: 09-05-1990  MRN: [id number]  CC: Annual Physical Exam  Subjective: Faith Ross is a 31 y.o. female who presents for annual physical exam.  Her concerns today include:  - Sertraline causing anxiety to increase and keeping her awake during the night. [place] which is causing an increased level of stress. States in the past she tried to get work accommodations and was told they would not accommodate her. States she is playing "phone tag" with [employer]. States they called her to schedule an appointment and she missed the call. When she called them back they never picked up or returned her call. Denies thoughts of self-harm, suicidal ideations, and homicidal ideations.  - Vaginal itching. Denies additional symptoms.   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 30 tablet 0   hydrOXYzine (ATARAX) 10 MG tablet Take 1 tablet (10 mg total) by mouth 3 (three) times daily as needed. 60 tablet 1   ibuprofen (ADVIL) 600 MG tablet Take 1 tablet (600 mg total) by mouth every 8 (eight) hours as needed. 30 tablet 0   medroxyPROGESTERone (DEPO-PROVERA) 150 MG/ML injection See admin instructions.     methocarbamol (ROBAXIN) 750 MG tablet Take 1 tablet (750 mg total) by mouth 3 (three) times  daily as needed (muscle spasm/pain). 15 tablet 0   metoCLOPramide (REGLAN) 10 MG tablet Take 1 tablet (10 mg total) by mouth every 8 (eight) hours as needed for nausea (Take with Benadryl for headache nausea.). 30 tablet 0   montelukast (SINGULAIR) 10 MG tablet Take 1 tablet (10 mg total) by mouth at bedtime. 30 tablet 1   naproxen (NAPROSYN) 500 MG tablet Take 1 tablet (500 mg total) by mouth 2 (two) times daily. 30 tablet 0   sertraline (ZOLOFT) 25 MG tablet Take 1 tablet (25 mg total) by mouth daily. 30 tablet 2   No current facility-administered medications on file prior to visit.    Allergies  Allergen Reactions   Toradol [Ketorolac Tromethamine] Anxiety    Social History   Socioeconomic History   Marital status: Single    Spouse name: Not on file   Number of children: Not on file   Years of education: Not on file   Highest education level: Not on file  Occupational History   Not on file  Tobacco Use   Smoking status: Never    Passive exposure: Never   Smokeless tobacco: Never  Vaping Use   Vaping Use: Never used  Substance and Sexual Activity   Alcohol use: Not Currently   Drug use: Never   Sexual activity: Not Currently    Birth control/protection: None  Other Topics Concern   Not on file  Social History Narrative   Not on file   Social Determinants of Health   Financial Resource  Strain: Not on file  Food Insecurity: Not on file  Transportation Needs: Not on file  Physical Activity: Not on file  Stress: Not on file  Social Connections: Not on file  Intimate Partner Violence: Not on file    Family History  Problem Relation Age of Onset   Thyroid disease Mother     Past Surgical History:  Procedure Laterality Date   TONSILLECTOMY      ROS: Review of Systems Negative except as stated above  PHYSICAL EXAM: BP 110/76 (BP Location: Left Arm, Patient Position: Sitting, Cuff Size: Normal)   Pulse 91   Temp 98.3 F (36.8 C)   Resp 16   Ht 5\' 6"   (1.676 m)   Wt 116 lb (52.6 kg)   SpO2 98%   BMI 18.72 kg/m   Physical Exam HENT:     Head: Normocephalic and atraumatic.     Right Ear: Tympanic membrane, ear canal and external ear normal.     Left Ear: Tympanic membrane, ear canal and external ear normal.     Nose: Nose normal.     Mouth/Throat:     Mouth: Mucous membranes are moist.     Pharynx: Oropharynx is clear.  Eyes:     Extraocular Movements: Extraocular movements intact.     Conjunctiva/sclera: Conjunctivae normal.     Pupils: Pupils are equal, round, and reactive to light.  Cardiovascular:     Rate and Rhythm: Normal rate and regular rhythm.     Pulses: Normal pulses.     Heart sounds: Normal heart sounds.  Pulmonary:     Effort: Pulmonary effort is normal.     Breath sounds: Normal breath sounds.  Chest:     Comments: Patient declined.  Abdominal:     General: Bowel sounds are normal.     Palpations: Abdomen is soft.  Genitourinary:    Comments: Patient declined.  Musculoskeletal:        General: Normal range of motion.     Right shoulder: Normal.     Left shoulder: Normal.     Right upper arm: Normal.     Left upper arm: Normal.     Right elbow: Normal.     Left elbow: Normal.     Right forearm: Normal.     Left forearm: Normal.     Right wrist: Normal.     Left wrist: Normal.     Right hand: Normal.     Left hand: Normal.     Cervical back: Normal, normal range of motion and neck supple.     Thoracic back: Normal.     Lumbar back: Normal.     Right hip: Normal.     Left hip: Normal.     Right upper leg: Normal.     Left upper leg: Normal.     Right knee: Normal.     Left knee: Normal.     Right lower leg: Normal.     Left lower leg: Normal.     Right ankle: Normal.     Left ankle: Normal.     Right foot: Normal.     Left foot: Normal.  Skin:    General: Skin is warm and dry.     Capillary Refill: Capillary refill takes less than 2 seconds.  Neurological:     General: No focal deficit  present.     Mental Status: She is alert and oriented to person, place, and time.  Psychiatric:  Mood and Affect: Mood normal.        Behavior: Behavior normal.     ASSESSMENT AND PLAN: 1. Annual physical exam - Counseled on 150 minutes of exercise per week as tolerated, healthy eating (including decreased daily intake of saturated fats, cholesterol, added sugars, sodium), STI prevention, and routine healthcare maintenance.  2. Diabetes mellitus screening - Routine screening.  - Hemoglobin A1c  3. Screening cholesterol level - Routine screening.  - Lipid panel  4. Thyroid disorder screen - Routine screening.  - TSH  5. Need for hepatitis C screening test - Routine screening.  - Hepatitis C Antibody  6. Anxiety and depression - Patient denies thoughts of self-harm, suicidal ideations, homicidal ideations. - Decrease Sertraline from 25 mg daily to 12.5 mg daily due to side effects (increased anxiety/restlessness) at the higher dose. Discussed with patient to cut 25 mg tablet in half with pill splitter.  - Patient states she will attempt to contact [facility] again to schedule an appointment. Today a new referral to Psychiatry ordered should patient continue to experience barriers establishing care with them. During the interim patient to follow-up with me as scheduled until established with referral. - Ambulatory referral to Psychiatry  7. Vaginal itching - Result pending.  - Cervicovaginal ancillary only   Patient was given the opportunity to ask questions.  Patient verbalized understanding of the plan and was able to repeat key elements of the plan. Patient was given clear instructions to go to Emergency Department or return to medical center if symptoms don't improve, worsen, or new problems develop.The patient verbalized understanding.   Orders Placed This Encounter  Procedures   Hepatitis C Antibody   Lipid panel   TSH   Hemoglobin A1c   Ambulatory  referral to Psychiatry    Return in about 1 year (around 02/20/2023) for Physical per patient preference.  Rema Fendt, NP

## 2022-02-19 ENCOUNTER — Other Ambulatory Visit (HOSPITAL_COMMUNITY)
Admission: RE | Admit: 2022-02-19 | Discharge: 2022-02-19 | Disposition: A | Discharge status: Home / Self Care | Payer: Medicaid Other | Source: Ambulatory Visit | Attending: Family | Admitting: Family

## 2022-02-19 ENCOUNTER — Ambulatory Visit (INDEPENDENT_AMBULATORY_CARE_PROVIDER_SITE_OTHER): Payer: Medicaid Other | Admitting: Family

## 2022-02-19 ENCOUNTER — Encounter: Payer: Self-pay | Admitting: Family

## 2022-02-19 VITALS — BP 110/76 | HR 91 | Temp 98.3°F | Resp 16 | Ht 66.0 in | Wt 116.0 lb

## 2022-02-19 DIAGNOSIS — F32A Depression, unspecified: Secondary | ICD-10-CM | POA: Diagnosis not present

## 2022-02-19 DIAGNOSIS — Z1159 Encounter for screening for other viral diseases: Secondary | ICD-10-CM

## 2022-02-19 DIAGNOSIS — Z0001 Encounter for general adult medical examination with abnormal findings: Secondary | ICD-10-CM | POA: Diagnosis not present

## 2022-02-19 DIAGNOSIS — Z131 Encounter for screening for diabetes mellitus: Secondary | ICD-10-CM

## 2022-02-19 DIAGNOSIS — Z13220 Encounter for screening for lipoid disorders: Secondary | ICD-10-CM

## 2022-02-19 DIAGNOSIS — N898 Other specified noninflammatory disorders of vagina: Secondary | ICD-10-CM | POA: Diagnosis not present

## 2022-02-19 DIAGNOSIS — Z1329 Encounter for screening for other suspected endocrine disorder: Secondary | ICD-10-CM

## 2022-02-19 DIAGNOSIS — F419 Anxiety disorder, unspecified: Secondary | ICD-10-CM | POA: Diagnosis not present

## 2022-02-19 DIAGNOSIS — Z0000 Encounter for general adult medical examination without abnormal findings: Secondary | ICD-10-CM

## 2022-02-19 NOTE — Progress Notes (Signed)
.  Pt presents for annual physical exam   -wants to discuss other medication options for anxiety and depression states medication is keeping her up all night

## 2022-02-19 NOTE — Patient Instructions (Addendum)
Please call [facility] to schedule appointment. Phone: [phone]  Address: [address] [place], [place] [address].  Preventive Care 21-32 Years Old, Female Preventive care refers to lifestyle choices and visits with your health care provider that can promote health and wellness. Preventive care visits are also called wellness exams. What can I expect for my preventive care visit? Counseling During your preventive care visit, your health care provider may ask about your: Medical history, including: Past medical problems. Family medical history. Pregnancy history. Current health, including: Menstrual cycle. Method of birth control. Emotional well-being. Home life and relationship well-being. Sexual activity and sexual health. Lifestyle, including: Alcohol, nicotine or tobacco, and drug use. Access to firearms. Diet, exercise, and sleep habits. Work and work environment. Sunscreen use. Safety issues such as seatbelt and bike helmet use. Physical exam Your health care provider may check your: Height and weight. These may be used to calculate your BMI (body mass index). BMI is a measurement that tells if you are at a healthy weight. Waist circumference. This measures the distance around your waistline. This measurement also tells if you are at a healthy weight and may help predict your risk of certain diseases, such as type 2 diabetes and high blood pressure. Heart rate and blood pressure. Body temperature. Skin for abnormal spots. What immunizations do I need?  Vaccines are usually given at various ages, according to a schedule. Your health care provider will recommend vaccines for you based on your age, medical history, and lifestyle or other factors, such as travel or where you work. What tests do I need? Screening Your health care provider may recommend screening tests for certain conditions. This may include: Pelvic exam and Pap test. Lipid and cholesterol  levels. Diabetes screening. This is done by checking your blood sugar (glucose) after you have not eaten for a while (fasting). Hepatitis B test. Hepatitis C test. HIV (human immunodeficiency virus) test. STI (sexually transmitted infection) testing, if you are at risk. BRCA-related cancer screening. This may be done if you have a family history of breast, ovarian, tubal, or peritoneal cancers. Talk with your health care provider about your test results, treatment options, and if necessary, the need for more tests. Follow these instructions at home: Eating and drinking  Eat a healthy diet that includes fresh fruits and vegetables, whole grains, lean protein, and low-fat dairy products. Take vitamin and mineral supplements as recommended by your health care provider. Do not drink alcohol if: Your health care provider tells you not to drink. You are pregnant, may be pregnant, or are planning to become pregnant. If you drink alcohol: Limit how much you have to 0-1 drink a day. Know how much alcohol is in your drink. In the [place] one drink equals one 12 oz bottle of beer (355 mL), one 5 oz glass of wine (148 mL), or one 1 oz glass of hard liquor (44 mL). Lifestyle Brush your teeth every morning and night with fluoride toothpaste. Floss one time each day. Exercise for at least 30 minutes 5 or more days each week. Do not use any products that contain nicotine or tobacco. These products include cigarettes, chewing tobacco, and vaping devices, such as e-cigarettes. If you need help quitting, ask your health care provider. Do not use drugs. If you are sexually active, practice safe sex. Use a condom or other form of protection to prevent STIs. If you do not wish to become pregnant, use a form of birth control. If   you plan to become pregnant, see your health care provider for a prepregnancy visit. Find healthy ways to manage stress, such as: Meditation, yoga, or listening to  music. Journaling. Talking to a trusted person. Spending time with friends and family. Minimize exposure to UV radiation to reduce your risk of skin cancer. Safety Always wear your seat belt while driving or riding in a vehicle. Do not drive: If you have been drinking alcohol. Do not ride with someone who has been drinking. If you have been using any mind-altering substances or drugs. While texting. When you are tired or distracted. Wear a helmet and other protective equipment during sports activities. If you have firearms in your house, make sure you follow all gun safety procedures. Seek help if you have been physically or sexually abused. What's next? Go to your health care provider once a year for an annual wellness visit. Ask your health care provider how often you should have your eyes and teeth checked. Stay up to date on all vaccines. This information is not intended to replace advice given to you by your health care provider. Make sure you discuss any questions you have with your health care provider. Document Revised: 08/07/2020 Document Reviewed: 08/07/2020 Elsevier Patient Education  2023 [employer] Inc[employer]  

## 2022-02-20 LAB — LIPID PANEL
Chol/HDL Ratio: 2.8 ratio (ref 0.0–4.4)
Cholesterol, Total: 110 mg/dL (ref 100–199)
HDL: 40 mg/dL (ref >39)
LDL Chol Calc (NIH): 59 mg/dL (ref 0–99)
Triglycerides: 47 mg/dL (ref 0–149)
VLDL Cholesterol Cal: 11 mg/dL (ref 5–40)

## 2022-02-20 LAB — POCT URINALYSIS DIP (CLINITEK)
Bilirubin, UA: NEGATIVE
Blood, UA: NEGATIVE
Glucose, UA: NEGATIVE mg/dL
Ketones, POC UA: NEGATIVE mg/dL
Nitrite, UA: NEGATIVE
POC PROTEIN,UA: NEGATIVE
Spec Grav, UA: 1.015 (ref 1.010–1.025)
Urobilinogen, UA: 0.2 E.U./dL
pH, UA: 7 (ref 5.0–8.0)

## 2022-02-20 LAB — TSH: TSH: 2.43 u[IU]/mL (ref 0.450–4.500)

## 2022-02-20 LAB — HEPATITIS C ANTIBODY: Hep C Virus Ab: NONREACTIVE

## 2022-02-20 LAB — HEMOGLOBIN A1C
Est. average glucose Bld gHb Est-mCnc: 114 mg/dL
Hgb A1c MFr Bld: 5.6 % (ref 4.8–5.6)

## 2022-02-20 NOTE — Addendum Note (Signed)
Addended by: Margorie John on: 02/20/2022 12:33 PM   Modules accepted: Orders

## 2022-02-24 ENCOUNTER — Other Ambulatory Visit: Payer: Self-pay | Admitting: Family

## 2022-02-24 DIAGNOSIS — N760 Acute vaginitis: Secondary | ICD-10-CM

## 2022-02-24 LAB — CERVICOVAGINAL ANCILLARY ONLY
Bacterial Vaginitis (gardnerella): POSITIVE — AB
Candida Glabrata: NEGATIVE
Candida Vaginitis: NEGATIVE
Chlamydia: NEGATIVE
Comment: NEGATIVE
Comment: NEGATIVE
Comment: NEGATIVE
Comment: NEGATIVE
Comment: NEGATIVE
Comment: NORMAL
Neisseria Gonorrhea: NEGATIVE
Trichomonas: NEGATIVE

## 2022-02-24 MED ORDER — METRONIDAZOLE 500 MG PO TABS: 500.0000 mg | ORAL_TABLET | Freq: Two times a day (BID) | ORAL | 0 refills | Status: DC

## 2022-02-25 ENCOUNTER — Telehealth: Payer: Self-pay | Admitting: Family

## 2022-02-25 ENCOUNTER — Encounter: Payer: Self-pay | Admitting: Family

## 2022-02-25 NOTE — Telephone Encounter (Unsigned)
Copied from [place] [phone]. Topic: General - Other >> Feb 25, 2022  4:09 PM Everette C wrote: Reason for CRM: The patient has called to share that they have received their prescription for metroNIDAZOLE (FLAGYL) 500 MG tablet [[id number]]  The patient would like to be prescribed the gel form of the medication instead   The patient shares that they do not do well with remembering to take tablets   Please contact the patient further if/when possible

## 2022-02-26 ENCOUNTER — Ambulatory Visit: Payer: Self-pay

## 2022-02-26 ENCOUNTER — Telehealth: Payer: Self-pay

## 2022-02-26 ENCOUNTER — Other Ambulatory Visit: Payer: Self-pay | Admitting: Family

## 2022-02-26 DIAGNOSIS — B9689 Other specified bacterial agents as the cause of diseases classified elsewhere: Secondary | ICD-10-CM

## 2022-02-26 MED ORDER — METRONIDAZOLE 0.75 % VA GEL: 1.0000 | Freq: Every day | VAGINAL | 0 refills | Status: AC

## 2022-02-26 NOTE — Telephone Encounter (Signed)
Order complete. 

## 2022-02-26 NOTE — Telephone Encounter (Signed)
Pt is wanting gel instead of pill form.

## 2022-02-26 NOTE — Telephone Encounter (Signed)
The patient has made an additional phone call to follow up on their request  Please contact the patient further when possible

## 2022-02-26 NOTE — Telephone Encounter (Signed)
Keep scheduled appointment. For immediate evaluation report to urgent care/emergency department.

## 2022-02-26 NOTE — Telephone Encounter (Signed)
  Chief Complaint: Diarrhea for 2 weeks Symptoms: Soft and or watery stools Frequency: 2 weeks Pertinent Negatives: Patient denies Fever, abdominal pain Disposition: [] ED /[] Urgent Care (no appt availability in office) / [x] Appointment(In office/virtual)/ []  [facility]/ [] Home Care/ [] Refused Recommended Disposition /[] [facility]/ []  Follow-up with PCP Additional Notes: PT declines UC and will wait for OC. PT will go to UC if needed. PT has had diarrhea since eating our for her birthday. Both pt and her mother have had diarrhea. PT may try pepto bismol in the meantime.    Reason for Disposition  [1] Mild diarrhea (e.g., 1-3 or more stools than normal in past 24 hours) without known cause AND [2] present >  7 days  Answer Assessment - Initial Assessment Questions 1. DIARRHEA SEVERITY: "How bad is the diarrhea?" "How many more stools have you had in the past 24 hours than normal?"    - NO DIARRHEA (SCALE 0)   - MILD (SCALE 1-3): Few loose or mushy BMs; increase of 1-3 stools over normal daily number of stools; mild increase in ostomy output.   -  MODERATE (SCALE 4-7): Increase of 4-6 stools daily over normal; moderate increase in ostomy output.   -  SEVERE (SCALE 8-10; OR "WORST POSSIBLE"): Increase of 7 or more stools daily over normal; moderate increase in ostomy output; incontinence.     mildly 2. ONSET: "When did the diarrhea begin?"      2 weeks 3. BM CONSISTENCY: "How loose or watery is the diarrhea?"      Watery  - soft 4. VOMITING: "Are you also vomiting?" If Yes, ask: "How many times in the past 24 hours?"      no 5. ABDOMEN PAIN: "Are you having any abdomen pain?" If Yes, ask: "What does it feel like?" (e.g., crampy, dull, intermittent, constant)      no 6. ABDOMEN PAIN SEVERITY: If present, ask: "How bad is the pain?"  (e.g., Scale 1-10; mild, moderate, or severe)   - MILD (1-3): doesn't interfere with normal activities, abdomen soft and not tender to  touch    - MODERATE (4-7): interferes with normal activities or awakens from sleep, abdomen tender to touch    - SEVERE (8-10): excruciating pain, doubled over, unable to do any normal activities       no 7. ORAL INTAKE: If vomiting, "Have you been able to drink liquids?" "How much liquids have you had in the past 24 hours?"     NA 8. HYDRATION: "Any signs of dehydration?" (e.g., dry mouth [not just dry lips], too weak to stand, dizziness, new weight loss) "When did you last urinate?"      9. EXPOSURE: "Have you traveled to a foreign country recently?" "Have you been exposed to anyone with diarrhea?" "Could you have eaten any food that was spoiled?"      10. ANTIBIOTIC USE: "Are you taking antibiotics now or have you taken antibiotics in the past 2 months?"       no 11. OTHER SYMPTOMS: "Do you have any other symptoms?" (e.g., fever, blood in stool)       no 12. PREGNANCY: "Is there any chance you are pregnant?" "When was your last menstrual period?"  Protocols used: [facility]

## 2022-02-27 NOTE — Telephone Encounter (Signed)
Patient reminded of appt with PCP 03/05/2021 at 1020  Has picked up flagyl.

## 2022-02-27 NOTE — Telephone Encounter (Signed)
Pt has been informed.

## 2022-02-27 NOTE — Telephone Encounter (Signed)
Patient aware and reminded of follow up.

## 2022-03-03 NOTE — Progress Notes (Deleted)
Patient ID: Faith Ross, female    DOB: 05-27-1990  MRN: [id number]  CC: Diarrhea  Subjective: Faith Ross is a 32 y.o. female who presents for diarrhea.   Her concerns today include:  02/26/2022 per triage RN note:  Chief Complaint: Diarrhea for 2 weeks Symptoms: Soft and or watery stools Frequency: 2 weeks Pertinent Negatives: Patient denies Fever, abdominal pain Disposition: []$ ED /[]$ Urgent Care (no appt availability in office) / [x]$ Appointment(In office/virtual)/ []$  [facility]/ []$ Home Care/ []$ Refused Recommended Disposition /[]$ [facility]/ []$  Follow-up with PCP Additional Notes: PT declines UC and will wait for OC. PT will go to UC if needed. PT has had diarrhea since eating our for her birthday. Both pt and her mother have had diarrhea. PT may try pepto bismol in the meantime.   Reason for Disposition  [1] Mild diarrhea (e.g., 1-3 or more stools than normal in past 24 hours) without known cause AND [2] present >  7 days  Answer Assessment - Initial Assessment Questions 1. DIARRHEA SEVERITY: "How bad is the diarrhea?" "How many more stools have you had in the past 24 hours than normal?"    - NO DIARRHEA (SCALE 0)   - MILD (SCALE 1-3): Few loose or mushy BMs; increase of 1-3 stools over normal daily number of stools; mild increase in ostomy output.   -  MODERATE (SCALE 4-7): Increase of 4-6 stools daily over normal; moderate increase in ostomy output.   -  SEVERE (SCALE 8-10; OR "WORST POSSIBLE"): Increase of 7 or more stools daily over normal; moderate increase in ostomy output; incontinence.     mildly 2. ONSET: "When did the diarrhea begin?"      2 weeks 3. BM CONSISTENCY: "How loose or watery is the diarrhea?"      Watery  - soft 4. VOMITING: "Are you also vomiting?" If Yes, ask: "How many times in the past 24 hours?"      no 5. ABDOMEN PAIN: "Are you having any abdomen pain?" If Yes, ask: "What does it feel like?" (e.g., crampy, dull,  intermittent, constant)      no 6. ABDOMEN PAIN SEVERITY: If present, ask: "How bad is the pain?"  (e.g., Scale 1-10; mild, moderate, or severe)   - MILD (1-3): doesn't interfere with normal activities, abdomen soft and not tender to touch    - MODERATE (4-7): interferes with normal activities or awakens from sleep, abdomen tender to touch    - SEVERE (8-10): excruciating pain, doubled over, unable to do any normal activities       no 7. ORAL INTAKE: If vomiting, "Have you been able to drink liquids?" "How much liquids have you had in the past 24 hours?"     NA 8. HYDRATION: "Any signs of dehydration?" (e.g., dry mouth [not just dry lips], too weak to stand, dizziness, new weight loss) "When did you last urinate?"      9. EXPOSURE: "Have you traveled to a foreign country recently?" "Have you been exposed to anyone with diarrhea?" "Could you have eaten any food that was spoiled?"      10. ANTIBIOTIC USE: "Are you taking antibiotics now or have you taken antibiotics in the past 2 months?"       no 11. OTHER SYMPTOMS: "Do you have any other symptoms?" (e.g., fever, blood in stool)       no 12. 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  30 tablet 0   hydrOXYzine (ATARAX) 10 MG tablet Take 1 tablet (10 mg total) by mouth 3 (three) times daily as needed. 60 tablet 1   ibuprofen (ADVIL) 600 MG tablet Take 1 tablet (600 mg total) by mouth every 8 (eight) hours as needed. 30 tablet 0   medroxyPROGESTERone (DEPO-PROVERA) 150 MG/ML injection See admin instructions.     methocarbamol (ROBAXIN) 750 MG tablet Take 1 tablet (750 mg total) by mouth 3 (three) times daily as needed (muscle spasm/pain). 15 tablet 0   metoCLOPramide (REGLAN) 10 MG tablet Take 1 tablet (10 mg total) by mouth every 8 (eight) hours as needed for nausea (Take with Benadryl for headache nausea.). 30 tablet 0   metroNIDAZOLE (METROGEL) 0.75 % vaginal gel Place 1 Applicatorful vaginally at bedtime for 5 days. 50 g 0   montelukast (SINGULAIR) 10 MG tablet Take 1 tablet (10 mg total) by mouth at bedtime. 30 tablet 1   naproxen (NAPROSYN) 500 MG tablet Take 1 tablet (500 mg total) by mouth 2 (two) times daily. 30 tablet 0   sertraline (ZOLOFT) 25 MG tablet Take 1 tablet (25 mg total) by mouth daily. 30 tablet 2   No current facility-administered medications on file prior to visit.    Allergies  Allergen Reactions   Toradol [Ketorolac Tromethamine] Anxiety    Social History   Socioeconomic History   Marital status: Single    Spouse name: Not on file   Number of children: Not on file   Years of education: Not on file   Highest education level: Not on file  Occupational History   Not on file  Tobacco Use   Smoking status: Never    Passive exposure: Never   Smokeless tobacco: Never  Vaping Use   Vaping Use: Never used  Substance and Sexual Activity   Alcohol use: Not Currently   Drug use: Never   Sexual activity: Not Currently    Birth control/protection: None  Other Topics Concern   Not on file  Social History Narrative   Not on file   Social Determinants of Health   Financial Resource Strain: Not on file  Food Insecurity: Not on file   Transportation Needs: Not on file  Physical Activity: Not on file  Stress: Not on file  Social Connections: Not on file  Intimate Partner Violence: Not on file    Family History  Problem Relation Age of Onset   Thyroid disease Mother     Past Surgical History:  Procedure Laterality Date   TONSILLECTOMY      ROS: Review of Systems Negative except as stated above  PHYSICAL EXAM: There were no vitals taken for this visit.  Physical Exam  {female adult master:310786} {female adult master:310785}     Latest Ref Rng & Units 12/24/2021  9:16 PM 06/03/2021    9:38 PM 05/31/2021    5:32 PM  CMP  Glucose 70 - 99 mg/dL 102  93  121   BUN 6 - 20 mg/dL 8  9  7   $ Creatinine 0.44 - 1.00 mg/dL 0.96  0.96  1.05   Sodium 135 - 145 mmol/L 137  138  137   Potassium 3.5 - 5.1 mmol/L 3.6  3.5  3.1   Chloride 98 - 111 mmol/L 107  109  108   CO2 22 - 32 mmol/L 24  22  25   $ Calcium 8.9 - 10.3 mg/dL 9.3  9.3  9.0   Total Protein 6.5 - 8.1 g/dL 9.3     Total Bilirubin 0.3 - 1.2 mg/dL 0.4     Alkaline Phos 38 - 126 U/L 32     AST 15 - 41 U/L 19     ALT 0 - 44 U/L 20      Lipid Panel     Component Value Date/Time   CHOL 110 02/19/2022 1101   TRIG 47 02/19/2022 1101   HDL 40 02/19/2022 1101   CHOLHDL 2.8 02/19/2022 1101   LDLCALC 59 02/19/2022 1101    CBC    Component Value Date/Time   WBC 3.0 (L) 12/24/2021 2116   RBC 3.67 (L) 12/24/2021 2116   HGB 11.3 (L) 12/24/2021 2116   HGB 11.6 11/13/2021 1020   HCT 32.0 (L) 12/24/2021 2116   HCT 34.3 11/13/2021 1020   PLT 244 12/24/2021 2116   PLT 245 11/13/2021 1020   MCV 87.2 12/24/2021 2116   MCV 88 11/13/2021 1020   MCH 30.8 12/24/2021 2116   MCHC 35.3 12/24/2021 2116   RDW 12.2 12/24/2021 2116   RDW 11.9 11/13/2021 1020   LYMPHSABS 0.6 (L) 12/24/2021 2116   LYMPHSABS 1.1 11/13/2021 1020   MONOABS 0.1 12/24/2021 2116   EOSABS 0.0 12/24/2021 2116   EOSABS 0.0 11/13/2021 1020   BASOSABS 0.0 12/24/2021 2116   BASOSABS 0.0  11/13/2021 1020    ASSESSMENT AND PLAN:  There are no diagnoses linked to this encounter.   Patient was given the opportunity to ask questions.  Patient verbalized understanding of the plan and was able to repeat key elements of the plan. Patient was given clear instructions to go to Emergency Department or return to medical center if symptoms don't improve, worsen, or new problems develop.The patient verbalized understanding.   No orders of the defined types were placed in this encounter.    Requested Prescriptions    No prescriptions requested or ordered in this encounter    No follow-ups on file.  Camillia Herter, NP

## 2022-03-05 ENCOUNTER — Ambulatory Visit: Payer: Medicaid Other | Admitting: Family

## 2022-03-05 DIAGNOSIS — R197 Diarrhea, unspecified: Secondary | ICD-10-CM

## 2022-03-25 ENCOUNTER — Ambulatory Visit: Payer: Self-pay

## 2022-03-25 NOTE — Telephone Encounter (Signed)
  Chief Complaint: chest pain  Symptoms: chest pain that comes and goes and radiates into L arm at times, 5/10 Frequency: 3 weeks  Pertinent Negatives: Patient denies chest pain present now  Disposition: [] ED /[] Urgent Care (no appt availability in office) / [x] Appointment(In office/virtual)/ []  [facility]/ [] Home Care/ [] Refused Recommended Disposition /[] [facility]/ []  Follow-up with PCP Additional Notes: pt states she has been told by EMS that chest pain is r/t anxiety but pt states she is having it when she moves a certain way as well so unsure if muscle related since she does heavy lifting at work. Denies chest pain present now and only lasts few secs when present. Pt does take Ibuprofen for pain at times. Pt scheduled appt for 03/31/22, added to waitlist and advised if sx get worse to call back or can go to UC. Pt verbalized understanding.    Reason for Disposition  [1] Chest pain(s) lasting a few seconds AND [2] persists > 3 days  Answer Assessment - Initial Assessment Questions 1. LOCATION: "Where does it hurt?"       In the chest  2. RADIATION: "Does the pain go anywhere else?" (e.g., into neck, jaw, arms, back)     L arm  3. ONSET: "When did the chest pain begin?" (Minutes, hours or days)      3 weeks  4. PATTERN: "Does the pain come and go, or has it been constant since it started?"  "Does it get worse with exertion?"      Comes and goes  6. SEVERITY: "How bad is the pain?"  (e.g., Scale 1-10; mild, moderate, or severe)    - MILD (1-3): doesn't interfere with normal activities     - MODERATE (4-7): interferes with normal activities or awakens from sleep    - SEVERE (8-10): excruciating pain, unable to do any normal activities       5/10 9. CAUSE: "What do you think is causing the chest pain?"     Possibly anxiety related  10. OTHER SYMPTOMS: "Do you have any other symptoms?" (e.g., dizziness, nausea, vomiting, sweating, fever, difficulty breathing,  cough)  Protocols used: Chest Pain-A-AH

## 2022-03-30 NOTE — Progress Notes (Deleted)
Patient ID: Faith Ross, female    DOB: [birth year]  MRN: [id number]  CC: Chest Discomfort   Subjective: Faith Ross is a 32 y.o. female who presents for chest discomfort.  Her concerns today include:   Anxiety - Sertraline 12.5 mg daily, Psych referral note 02/25/2022 Patient has an appointment with another provider already.   03/25/2022 per triage RN note: Chief Complaint: chest pain  Symptoms: chest pain that comes and goes and radiates into L arm at times, 5/10 Frequency: 3 weeks  Pertinent Negatives: Patient denies chest pain present now  Disposition: '[]'$ ED /'[]'$ Urgent Care (no appt availability in office) / '[x]'$ Appointment(In office/virtual)/ '[]'$  [facility]/ '[]'$ Home Care/ '[]'$ Refused Recommended Disposition /'[]'$ [facility]/ '[]'$  Follow-up with PCP Additional Notes: pt states she has been told by EMS that chest pain is r/t anxiety but pt states she is having it when she moves a certain way as well so unsure if muscle related since she does heavy lifting at work. Denies chest pain present now and only lasts few secs when present. Pt does take Ibuprofen for pain at times. Pt scheduled appt for 03/31/22, added to waitlist and advised if sx get worse to call back or can go to UC. Pt verbalized understanding.     Reason for Disposition  [1] Chest pain(s) lasting a few seconds AND [2] persists > 3 days  Answer Assessment - Initial Assessment Questions 1. LOCATION: "Where does it hurt?"       In the chest  2. RADIATION: "Does the pain go anywhere else?" (e.g., into neck, jaw, arms, back)     L arm  3. ONSET: "When did the chest pain begin?" (Minutes, hours or days)      3 weeks  4. PATTERN: "Does the pain come and go, or has it been constant since it started?"  "Does it get worse with exertion?"      Comes and goes  6. SEVERITY: "How bad is the pain?"  (e.g., Scale 1-10; mild, moderate, or severe)    - MILD (1-3): doesn't interfere with normal activities     -  MODERATE (4-7): interferes with normal activities or awakens from sleep    - SEVERE (8-10): excruciating pain, unable to do any normal activities       5/10 9. CAUSE: "What do you think is causing the chest pain?"     Possibly anxiety related  10. 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 30 tablet 0   hydrOXYzine (ATARAX) 10 MG tablet Take 1 tablet (10 mg total) by mouth 3 (three) times daily as needed. 60 tablet 1   ibuprofen (ADVIL) 600 MG tablet Take 1 tablet (600 mg total) by mouth every 8 (eight)  hours as needed. 30 tablet 0   medroxyPROGESTERone (DEPO-PROVERA) 150 MG/ML injection See admin instructions.     methocarbamol (ROBAXIN) 750 MG tablet Take 1 tablet (750 mg total) by mouth 3 (three) times daily as needed (muscle spasm/pain). 15 tablet 0   metoCLOPramide (REGLAN) 10 MG tablet Take 1 tablet (10 mg total) by mouth every 8 (eight) hours as needed for nausea (Take with Benadryl for headache nausea.). 30 tablet 0   montelukast (SINGULAIR) 10 MG tablet Take 1 tablet (10 mg total) by mouth at bedtime. 30 tablet 1   naproxen (NAPROSYN) 500 MG tablet Take 1 tablet (500 mg total) by mouth 2  (two) times daily. 30 tablet 0   sertraline (ZOLOFT) 25 MG tablet Take 1 tablet (25 mg total) by mouth daily. 30 tablet 2   No current facility-administered medications on file prior to visit.    Allergies  Allergen Reactions   Toradol [Ketorolac Tromethamine] Anxiety    Social History   Socioeconomic History   Marital status: Single    Spouse name: Not on file   Number of children: Not on file   Years of education: Not on file   Highest education level: Not on file  Occupational History   Not on file  Tobacco Use   Smoking status: Never    Passive exposure: Never   Smokeless tobacco: Never  Vaping Use   Vaping Use: Never used  Substance and Sexual Activity   Alcohol use: Not Currently   Drug use: Never   Sexual activity: Not Currently    Birth control/protection: None  Other Topics Concern   Not on file  Social History Narrative   Not on file   Social Determinants of Health   Financial Resource Strain: Not on file  Food Insecurity: Not on file  Transportation Needs: Not on file  Physical Activity: Not on file  Stress: Not on file  Social Connections: Not on file  Intimate Partner Violence: Not on file    Family History  Problem Relation Age of Onset   Thyroid disease Mother     Past Surgical History:  Procedure Laterality Date   TONSILLECTOMY      ROS: Review of Systems Negative except as stated above  PHYSICAL EXAM: There were no vitals taken for this visit.  Physical Exam  {female adult master:310786} {female adult master:310785}     Latest Ref Rng & Units 12/24/2021    9:16 PM 06/03/2021    9:38 PM 05/31/2021    5:32 PM  CMP  Glucose 70 - 99 mg/dL 102  93  121   BUN 6 - 20 mg/dL '8  9  7   '$ Creatinine 0.44 - 1.00 mg/dL 0.96  0.96  1.05   Sodium 135 - 145 mmol/L 137  138  137   Potassium 3.5 - 5.1 mmol/L 3.6  3.5  3.1   Chloride 98 - 111 mmol/L 107  109  108   CO2 22 - 32 mmol/L '24  22  25   '$ Calcium 8.9 - 10.3 mg/dL 9.3  9.3  9.0   Total  Protein 6.5 - 8.1 g/dL 9.3     Total Bilirubin 0.3 - 1.2 mg/dL 0.4     Alkaline Phos 38 - 126 U/L 32     AST 15 - 41 U/L 19     ALT 0 - 44 U/L 20      Lipid Panel     Component Value Date/Time   CHOL 110 02/19/2022  1101   TRIG 47 02/19/2022 1101   HDL 40 02/19/2022 1101   CHOLHDL 2.8 02/19/2022 1101   LDLCALC 59 02/19/2022 1101    CBC    Component Value Date/Time   WBC 3.0 (L) 12/24/2021 2116   RBC 3.67 (L) 12/24/2021 2116   HGB 11.3 (L) 12/24/2021 2116   HGB 11.6 11/13/2021 1020   HCT 32.0 (L) 12/24/2021 2116   HCT 34.3 11/13/2021 1020   PLT 244 12/24/2021 2116   PLT 245 11/13/2021 1020   MCV 87.2 12/24/2021 2116   MCV 88 11/13/2021 1020   MCH 30.8 12/24/2021 2116   MCHC 35.3 12/24/2021 2116   RDW 12.2 12/24/2021 2116   RDW 11.9 11/13/2021 1020   LYMPHSABS 0.6 (L) 12/24/2021 2116   LYMPHSABS 1.1 11/13/2021 1020   MONOABS 0.1 12/24/2021 2116   EOSABS 0.0 12/24/2021 2116   EOSABS 0.0 11/13/2021 1020   BASOSABS 0.0 12/24/2021 2116   BASOSABS 0.0 11/13/2021 1020    ASSESSMENT AND PLAN:  There are no diagnoses linked to this encounter.   Patient was given the opportunity to ask questions.  Patient verbalized understanding of the plan and was able to repeat key elements of the plan. Patient was given clear instructions to go to Emergency Department or return to medical center if symptoms don't improve, worsen, or new problems develop.The patient verbalized understanding.   No orders of the defined types were placed in this encounter.    Requested Prescriptions    No prescriptions requested or ordered in this encounter    No follow-ups on file.  Camillia Herter, NP

## 2022-03-31 ENCOUNTER — Ambulatory Visit: Payer: Medicaid Other | Admitting: Family

## 2022-04-02 ENCOUNTER — Encounter: Payer: Medicaid Other | Admitting: Family Medicine

## 2022-04-02 DIAGNOSIS — Z5321 Procedure and treatment not carried out due to patient leaving prior to being seen by health care provider: Secondary | ICD-10-CM

## 2022-04-03 NOTE — Progress Notes (Signed)
Patient left without being seen as she was too early for Depo provera injection

## 2022-04-07 ENCOUNTER — Ambulatory Visit: Payer: Self-pay | Admitting: *Deleted

## 2022-04-07 NOTE — Telephone Encounter (Signed)
Pt calling back for follow up. Advised will route to practice for PCPs review.

## 2022-04-07 NOTE — Telephone Encounter (Signed)
   Chief Complaint: Flu Exposure, direct household Symptoms: Body aches,dry cough, wheezing at times with cough,headache. Exposed Saturday Frequency: Symptoms onset Yesterday Pertinent Negatives: Patient denies fever, SOB Disposition: [] ED /[] Urgent Care (no appt availability in office) / [] Appointment(In office/virtual)/ []  [facility]/ [x] Home Care/ [] Refused Recommended Disposition /[] [facility]/ []  Follow-up with PCP Additional Notes: Pt states prior to triage call secured appt at [facility] to test for flu. Advised test for covid as well. Advised NT will CB for results of testing for further assessment.  1045: Called pt, states neg for flu and covid. Home care advise provided, please advise further regarding TamiFlu and direct exposure to flu. Reason for Disposition  [1] Influenza EXPOSURE within last 48 hours (2 days) AND [2] NOT HIGH RISK AND [3] strongly requests antiviral medication  Answer Assessment - Initial Assessment Questions 1. TYPE of EXPOSURE: "How were you exposed?" (e.g., close contact, not a close contact)     Household, nephew 2. DATE of EXPOSURE: "When did the exposure occur?" (e.g., hour, days, weeks)     Saturday, Sunday 3. PREGNANCY: "Is there any chance you are pregnant?" "When was your last menstrual period?"     No 4. HIGH RISK for COMPLICATIONS: "Do you have any heart or lung problems?" "Do you have a weakened immune system?" (e.g., CHF, COPD, asthma, HIV positive, chemotherapy, renal failure, diabetes mellitus, sickle cell anemia)     No 5. SYMPTOMS: "Do you have any symptoms?" (e.g., cough, fever, sore throat, difficulty breathing).    Body aches, dry cough, wheezing with coughing at times, headache.  Protocols used: Influenza (Flu) [facility]

## 2022-04-08 ENCOUNTER — Ambulatory Visit: Payer: Medicaid Other | Admitting: Family

## 2022-04-08 ENCOUNTER — Telehealth: Payer: Self-pay | Admitting: Family

## 2022-04-08 ENCOUNTER — Ambulatory Visit (INDEPENDENT_AMBULATORY_CARE_PROVIDER_SITE_OTHER): Payer: Medicaid Other

## 2022-04-08 ENCOUNTER — Ambulatory Visit
Admission: EM | Admit: 2022-04-08 | Discharge: 2022-04-08 | Discharge status: Against Medical Advice | Payer: Medicaid Other

## 2022-04-08 ENCOUNTER — Telehealth: Payer: Medicaid Other | Admitting: Nurse Practitioner

## 2022-04-08 ENCOUNTER — Ambulatory Visit: Payer: Self-pay

## 2022-04-08 DIAGNOSIS — Z3042 Encounter for surveillance of injectable contraceptive: Secondary | ICD-10-CM | POA: Diagnosis not present

## 2022-04-08 DIAGNOSIS — J40 Bronchitis, not specified as acute or chronic: Secondary | ICD-10-CM | POA: Diagnosis not present

## 2022-04-08 MED ORDER — MEDROXYPROGESTERONE ACETATE 150 MG/ML IM SUSP
150.0000 mg | Freq: Once | INTRAMUSCULAR | Status: AC
  Administered 2022-04-08: 150 mg via INTRAMUSCULAR

## 2022-04-08 MED ORDER — PROMETHAZINE-DM 6.25-15 MG/5ML PO SYRP: 5.0000 mL | ORAL_SOLUTION | Freq: Four times a day (QID) | ORAL | 0 refills | Status: AC | PRN

## 2022-04-08 MED ORDER — PREDNISONE 20 MG PO TABS: 40.0000 mg | ORAL_TABLET | Freq: Every day | ORAL | 0 refills | Status: AC

## 2022-04-08 NOTE — Progress Notes (Signed)
Virtual Visit Consent   Faith Ross, you are scheduled for a virtual visit with Faith Ross, [place], a [facility] provider, today.     Just as with appointments in the office, your consent must be obtained to participate.  Your consent will be active for this visit and any virtual visit you may have with one of our providers in the next 365 days.     If you have a MyChart account, a copy of this consent can be sent to you electronically.  All virtual visits are billed to your insurance company just like a traditional visit in the office.    As this is a virtual visit, video technology does not allow for your provider to perform a traditional examination.  This may limit your provider's ability to fully assess your condition.  If your provider identifies any concerns that need to be evaluated in person or the need to arrange testing (such as labs, EKG, etc.), we will make arrangements to do so.     Although advances in technology are sophisticated, we cannot ensure that it will always work on either your end or our end.  If the connection with a video visit is poor, the visit may have to be switched to a telephone visit.  With either a video or telephone visit, we are not always able to ensure that we have a secure connection.     I need to obtain your verbal consent now.   Are you willing to proceed with your visit today? YES   Faith Ross has provided verbal consent on 04/08/2022 for a virtual visit (video or telephone).   Faith Hassell Done, FNP   Date: 04/08/2022 11:02 AM   Virtual Visit via Video Note   I, Faith Ross, connected with Faith Ross ([id number], 03-04-1990) on 04/08/22 at 11:15 AM EST by a video-enabled telemedicine application and verified that I am speaking with the correct person using two identifiers.  Location: Patient: Virtual Visit Location Patient: Home Provider: Virtual Visit Location Provider: Mobile   I discussed the limitations of  evaluation and management by telemedicine and the availability of in person appointments. The patient expressed understanding and agreed to proceed.    History of Present Illness: Faith Ross is a 32 y.o. who identifies as a female who was assigned female at birth, and is being seen today for cough .  HPI: Covid test negative yesterday  Cough This is a new problem. Episode onset: 3 days ago. The problem has been gradually worsening. The problem occurs every few minutes. The cough is Productive of purulent sputum. Associated symptoms include ear congestion, ear pain, rhinorrhea and wheezing. Pertinent negatives include no chills, fever or shortness of breath. Nothing aggravates the symptoms. Treatments tried: theraflu and robiitussin. The treatment provided mild relief. There is no history of asthma, bronchitis or COPD.    Review of Systems  Constitutional:  Negative for chills and fever.  HENT:  Positive for ear pain and rhinorrhea.   Respiratory:  Positive for cough and wheezing. Negative for shortness of breath.     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   Seborrheic dermatitis 01/22/2022   GAD (generalized anxiety disorder) 04/23/2021   Urinary tract infection without hematuria 03/06/2021   Dental abscess 03/06/2021   BV (bacterial vaginosis) 12/31/2020   Chronic nasal congestion 07/25/2019   Deviated nasal septum 07/25/2019   Nasal turbinate hypertrophy 07/25/2019   Anxiety 12/02/2017  Chest pain 12/02/2017   Palpitations 12/02/2017    Allergies:  Allergies  Allergen Reactions   Toradol [Ketorolac Tromethamine] Anxiety   Medications:  Current Outpatient Medications:    diphenhydrAMINE (BENADRYL) 25 MG tablet, Take 1 tablet (25 mg total) by mouth every 6 (six) hours as needed., Disp: 30 tablet, Rfl: 0   hydrOXYzine (ATARAX) 10 MG tablet, Take 1 tablet (10 mg total) by mouth 3 (three) times daily as  needed., Disp: 60 tablet, Rfl: 1   ibuprofen (ADVIL) 600 MG tablet, Take 1 tablet (600 mg total) by mouth every 8 (eight) hours as needed., Disp: 30 tablet, Rfl: 0   medroxyPROGESTERone (DEPO-PROVERA) 150 MG/ML injection, See admin instructions., Disp: , Rfl:    methocarbamol (ROBAXIN) 750 MG tablet, Take 1 tablet (750 mg total) by mouth 3 (three) times daily as needed (muscle spasm/pain)., Disp: 15 tablet, Rfl: 0   metoCLOPramide (REGLAN) 10 MG tablet, Take 1 tablet (10 mg total) by mouth every 8 (eight) hours as needed for nausea (Take with Benadryl for headache nausea.)., Disp: 30 tablet, Rfl: 0   montelukast (SINGULAIR) 10 MG tablet, Take 1 tablet (10 mg total) by mouth at bedtime., Disp: 30 tablet, Rfl: 1   naproxen (NAPROSYN) 500 MG tablet, Take 1 tablet (500 mg total) by mouth 2 (two) times daily., Disp: 30 tablet, Rfl: 0   sertraline (ZOLOFT) 25 MG tablet, Take 1 tablet (25 mg total) by mouth daily., Disp: 30 tablet, Rfl: 2  Observations/Objective: Patient is well-developed, well-nourished in no acute distress.  Resting comfortably  at home.  Head is normocephalic, atraumatic.  No labored breathing.  Speech is clear and coherent with logical content.  Patient is alert and oriented at baseline.  Wet cough  Assessment and Plan:  Faith Ross in today with chief complaint of Cough   1. Bronchitis 1. Take meds as prescribed 2. Use a cool mist humidifier especially during the winter months and when heat has been humid. 3. Use saline nose sprays frequently 4. Saline irrigations of the nose can be very helpful if Ross frequently.  * 4X daily for 1 week*  * Use of a nettie pot can be helpful with this. Follow directions with this* 5. Drink plenty of fluids 6. Keep thermostat turn down low 7.For any cough or congestion- promethazine DM 8. For fever or aces or pains- take tylenol or ibuprofen appropriate for age and weight.  * for fevers greater than 101 orally you may alternate  ibuprofen and tylenol every  3 hours.    - promethazine-dextromethorphan (PROMETHAZINE-DM) 6.25-15 MG/5ML syrup; Take 5 mLs by mouth 4 (four) times daily as needed for cough.  Dispense: 118 mL; Refill: 0 - predniSONE (DELTASONE) 20 MG tablet; Take 2 tablets (40 mg total) by mouth daily with breakfast for 5 days. 2 po daily for 5 days  Dispense: 10 tablet; Refill: 0    The above assessment and management plan was discussed with the patient. The patient verbalized understanding of and has agreed to the management plan. Patient is aware to call the clinic if symptoms persist or worsen. Patient is aware when to return to the clinic for a follow-up visit. Patient educated on when it is appropriate to go to the emergency department.   Faith Hassell Done, FNP    Follow Up Instructions: I discussed the assessment and treatment plan with the patient. The patient was provided an opportunity to ask questions and all were answered. The patient agreed with the plan and demonstrated an understanding  of the instructions.  A copy of instructions were sent to the patient via MyChart.  The patient was advised to call back or seek an in-person evaluation if the symptoms worsen or if the condition fails to improve as anticipated.  Time:  I spent 12 minutes with the patient via telehealth technology discussing the above problems/concerns.    Faith Hassell Done, FNP

## 2022-04-08 NOTE — ED Notes (Addendum)
Called patient from waiting room, no answer. Called patient x 2 from waiting room, no answer.

## 2022-04-08 NOTE — Telephone Encounter (Signed)
  Chief Complaint: feeling worse, wheezing,  Symptoms: harsh cough, headache, runny nose Frequency: 2 days ago Pertinent Negatives: Patient denies SOB, chest pain Disposition: [] ED /[x] Urgent Care (no appt availability in office) / [] Appointment(In office/virtual)/ []  [facility]/ [] Home Care/ [] Refused Recommended Disposition /[] [facility]/ []  Follow-up with PCP Additional Notes: - Reason for Disposition . Wheezing is present  Answer Assessment - Initial Assessment Questions 1. ONSET: "When did the cough begin?"      2. SEVERITY: "How bad is the cough today?"      Frequent and harsh 3. SPUTUM: "Describe the color of your sputum" (none, dry cough; clear, white, yellow, green)     Yellow green 4. HEMOPTYSIS: "Are you coughing up any blood?" If so ask: "How much?" (flecks, streaks, tablespoons, etc.)     no 5. DIFFICULTY BREATHING: "Are you having difficulty breathing?" If Yes, ask: "How bad is it?" (e.g., mild, moderate, severe)    - MILD: No SOB at rest, mild SOB with walking, speaks normally in sentences, can lie down, no retractions, pulse < 100.    - MODERATE: SOB at rest, SOB with minimal exertion and prefers to sit, cannot lie down flat, speaks in phrases, mild retractions, audible wheezing, pulse 100-120.    - SEVERE: Very SOB at rest, speaks in single words, struggling to breathe, sitting hunched forward, retractions, pulse > 120      No SOB 6. FEVER: "Do you have a fever?" If Yes, ask: "What is your temperature, how was it measured, and when did it start?"     no 10. OTHER SYMPTOMS: "Do you have any other symptoms?" (e.g., runny nose, wheezing, chest pain)       Wheezing, woke up in cold sweat, hoarse  Protocols used: Cough - Acute Productive-A-AH

## 2022-04-08 NOTE — ED Notes (Signed)
No answer in waiting x2.

## 2022-04-08 NOTE — Patient Instructions (Signed)
Faith Ross, thank you for joining Chevis Pretty, FNP for today's virtual visit.  While this provider is not your primary care provider (PCP), if your PCP is located in our provider database this encounter information will be shared with them immediately following your visit.   [facility] account gives you access to today's visit and all your visits, tests, and labs performed at [facility] " click here if you don't have a [facility] account or go to [email or web address]  Consent: (Patient) Faith Ross provided verbal consent for this virtual visit at the beginning of the encounter.  Current Medications:  Current Outpatient Medications:    predniSONE (DELTASONE) 20 MG tablet, Take 2 tablets (40 mg total) by mouth daily with breakfast for 5 days. 2 po daily for 5 days, Disp: 10 tablet, Rfl: 0   promethazine-dextromethorphan (PROMETHAZINE-DM) 6.25-15 MG/5ML syrup, Take 5 mLs by mouth 4 (four) times daily as needed for cough., Disp: 118 mL, Rfl: 0   diphenhydrAMINE (BENADRYL) 25 MG tablet, Take 1 tablet (25 mg total) by mouth every 6 (six) hours as needed., Disp: 30 tablet, Rfl: 0   hydrOXYzine (ATARAX) 10 MG tablet, Take 1 tablet (10 mg total) by mouth 3 (three) times daily as needed., Disp: 60 tablet, Rfl: 1   ibuprofen (ADVIL) 600 MG tablet, Take 1 tablet (600 mg total) by mouth every 8 (eight) hours as needed., Disp: 30 tablet, Rfl: 0   medroxyPROGESTERone (DEPO-PROVERA) 150 MG/ML injection, See admin instructions., Disp: , Rfl:    methocarbamol (ROBAXIN) 750 MG tablet, Take 1 tablet (750 mg total) by mouth 3 (three) times daily as needed (muscle spasm/pain)., Disp: 15 tablet, Rfl: 0   metoCLOPramide (REGLAN) 10 MG tablet, Take 1 tablet (10 mg total) by mouth every 8 (eight) hours as needed for nausea (Take with Benadryl for headache nausea.)., Disp: 30 tablet, Rfl: 0   montelukast (SINGULAIR) 10 MG tablet, Take 1 tablet (10 mg total) by mouth at  bedtime., Disp: 30 tablet, Rfl: 1   naproxen (NAPROSYN) 500 MG tablet, Take 1 tablet (500 mg total) by mouth 2 (two) times daily., Disp: 30 tablet, Rfl: 0   sertraline (ZOLOFT) 25 MG tablet, Take 1 tablet (25 mg total) by mouth daily., Disp: 30 tablet, Rfl: 2   Medications ordered in this encounter:  Meds ordered this encounter  Medications   promethazine-dextromethorphan (PROMETHAZINE-DM) 6.25-15 MG/5ML syrup    Sig: Take 5 mLs by mouth 4 (four) times daily as needed for cough.    Dispense:  118 mL    Refill:  0    Order Specific Question:   Supervising Provider    Answer:   Chase Picket [id number]   predniSONE (DELTASONE) 20 MG tablet    Sig: Take 2 tablets (40 mg total) by mouth daily with breakfast for 5 days. 2 po daily for 5 days    Dispense:  10 tablet    Refill:  0    Order Specific Question:   Supervising Provider    Answer:   Chase Picket [id number]     *If you need refills on other medications prior to your next appointment, please contact your pharmacy*  Follow-Up: Call back or seek an in-person evaluation if the symptoms worsen or if the condition fails to improve as anticipated.  [place] [phone]  Other Instructions 1. Take meds as prescribed 2. Use a cool mist humidifier especially during the winter months and when heat has been humid. 3.  Use saline nose sprays frequently 4. Saline irrigations of the nose can be very helpful if done frequently.  * 4X daily for 1 week*  * Use of a nettie pot can be helpful with this. Follow directions with this* 5. Drink plenty of fluids 6. Keep thermostat turn down low 7.For any cough or congestion- promethazine  8. For fever or aces or pains- take tylenol or ibuprofen appropriate for age and weight.  * for fevers greater than 101 orally you may alternate ibuprofen and tylenol every  3 hours.      If you have been instructed to have an in-person evaluation today at a local Urgent Care  facility, please use the link below. It will take you to a list of all of our available [facility], including address, phone number and hours of operation. Please do not delay care.  [facility]  If you or a family member do not have a primary care provider, use the link below to schedule a visit and establish care. When you choose a [facility] primary care physician or advanced practice provider, you gain a long-term partner in health. Find a Primary Care Provider  Learn more about [facility]'s in-office and virtual care options: [facility]

## 2022-04-08 NOTE — Progress Notes (Signed)
Depo-contraceptive administered right ventrogluteal, pt tolerated injection well -next administration due 06/25/22-07/09/22

## 2022-04-08 NOTE — Telephone Encounter (Signed)
Patient came in today and received her depo injection. Patient was prescribed Prednisone and Promethazine and wants know will those medications interfere with her depo shot. Please advise patient.

## 2022-04-08 NOTE — Telephone Encounter (Signed)
Pt was going to go to UC but her car will not start. Made pt a virtual Urgent Care Visit for today at 1115.

## 2022-04-09 ENCOUNTER — Ambulatory Visit: Payer: Self-pay | Admitting: *Deleted

## 2022-04-09 NOTE — Telephone Encounter (Signed)
Reason for Disposition  ALSO, mild central chest pain occurs only when coughing    Did a tele visit yesterday 2/14 and was given prednisone and Promethazine.  Answer Assessment - Initial Assessment Questions 1. ONSET: "When did the cough begin?"      Flu and Covid tests are negative.      Sunday started getting sick.   I went to [facility] and they tested me.    2. SEVERITY: "How bad is the cough today?"      Coughing a lot.    I want to make an appt to be seen.   I'm feeling worse    The coughing is making my chest hurt.     Yesterday I felt better.    I started on medication.    I did a virtual visit and they prescribed me something.    Prednisone for wheezing and Promethazine for the cough.     3. SPUTUM: "Describe the color of your sputum" (none, dry cough; clear, white, yellow, green)     Both dry and then mucus comes up.     4. HEMOPTYSIS: "Are you coughing up any blood?" If so ask: "How much?" (flecks, streaks, tablespoons, etc.)     Not asked 5. DIFFICULTY BREATHING: "Are you having difficulty breathing?" If Yes, ask: "How bad is it?" (e.g., mild, moderate, severe)    - MILD: No SOB at rest, mild SOB with walking, speaks normally in sentences, can lie down, no retractions, pulse < 100.    - MODERATE: SOB at rest, SOB with minimal exertion and prefers to sit, cannot lie down flat, speaks in phrases, mild retractions, audible wheezing, pulse 100-120.    - SEVERE: Very SOB at rest, speaks in single words, struggling to breathe, sitting hunched forward, retractions, pulse > 120      I'll wait and see how the medications help they gave me on the tele health visit.     I woke up sweating night before last.     Last night I was sweating a little.    6. FEVER: "Do you have a fever?" If Yes, ask: "What is your temperature, how was it measured, and when did it start?"     No 7. CARDIAC HISTORY: "Do you have any history of heart disease?" (e.g., heart attack, congestive heart failure)      Not  asked 8. LUNG HISTORY: "Do you have any history of lung disease?"  (e.g., pulmonary embolus, asthma, emphysema)     Not asked 9. PE RISK FACTORS: "Do you have a history of blood clots?" (or: recent major surgery, recent prolonged travel, bedridden)     Not asked  10. OTHER SYMPTOMS: "Do you have any other symptoms?" (e.g., runny nose, wheezing, chest pain)       Coughing a lot, chest sore from coughing so much, nasal congestion that is better, a little wheezing.   No shortness of breath.   11. PREGNANCY: "Is there any chance you are pregnant?" "When was your last menstrual period?"       Not asked 12. TRAVEL: "Have you traveled out of the country in the last month?" (e.g., travel history, exposures)       Not asked  Protocols used: Cough - Acute Productive-A-AH

## 2022-04-09 NOTE — Telephone Encounter (Signed)
  Chief Complaint: Diagnosed with URI via a tele health visit yesterday 04/08/2022.   Not feeling better.  Symptoms: coughing a lot some productive but mostly dry, nasal congestion, a little wheezing, soreness in chest from coughing so much. Frequency: Since this past weekend.   Was tested for flu and Covid and was negative for both at [facility]. Pertinent Negatives: Patient denies shortness of breath.   Disposition: [] ED /[] Urgent Care (no appt availability in office) / [] Appointment(In office/virtual)/ []  [facility]/ [x] Home Care/ [] Refused Recommended Disposition /[] [facility]/ []  Follow-up with PCP Additional Notes: Went over the Home Care Advice with her.   Encouraged saline nasal spray too. She was just started on Prednisone and Promethazine yesterday during her tele visit.    She's going to give the medications another day or two to see if she starts feeling better.    I let her know it takes a week or two for these URI viruses to run their course.    I went over s/s to go to the urgent care or ED in the Home Care Advice.   She was agreeable to this plan and will call back if the medications are not helping in a couple of days.

## 2022-04-10 ENCOUNTER — Ambulatory Visit: Payer: Self-pay

## 2022-04-10 NOTE — Telephone Encounter (Signed)
  Chief Complaint: Cough Symptoms: Cough Frequency: Sunday Pertinent Negatives: Patient denies Fever Disposition: []ED /[]Urgent Care (no appt availability in office) / []Appointment(In office/virtual)/ [] [facility]/ []Home Care/ []Refused Recommended Disposition /[][facility] [facility] [facility]/ [x] Follow-up with PCP Additional Notes: Pt was exposed to the flu last Friday through Sunday. Pt started s/s on Sunday.  Pt was seen and given medications on weds. Pt reports that most of the S/s have resolved except for cough.   Pt may make a VV appointment.   Please advise.    Reason for Disposition  [1] Probable influenza (fever) with no complications AND [2] NOT HIGH RISK  Answer Assessment - Initial Assessment Questions 1. WORST SYMPTOM: "What is your worst symptom?" (e.g., cough, runny nose, muscle aches, headache, sore throat, fever)      Cough 2. ONSET: "When did your flu symptoms start?"      Sunday 3. COUGH: "How bad is the cough?"       Bad 4. RESPIRATORY DISTRESS: "Describe your breathing."      ok 5. FEVER: "Do you have a fever?" If Yes, ask: "What is your temperature, how was it measured, and when did it start?"     no 6. EXPOSURE: "Were you exposed to someone with influenza?"       Yes last Friday through Sunday 7. FLU VACCINE: "Did you get a flu shot this year?"      8. HIGH RISK DISEASE: "Do you have any chronic medical problems?" (e.g., heart or lung disease, asthma, weak immune system, or other HIGH RISK conditions)      9. PREGNANCY: "Is there any chance you are pregnant?" "When was your last menstrual period?"      10$ . OTHER SYMPTOMS: "Do you have any other symptoms?"  (e.g., runny nose, muscle aches, headache, sore throat)  Protocols used: Influenza (Flu) - [facility]

## 2022-04-20 ENCOUNTER — Telehealth: Payer: Medicaid Other | Admitting: Family Medicine

## 2022-04-20 ENCOUNTER — Ambulatory Visit: Payer: Self-pay

## 2022-04-20 DIAGNOSIS — H6692 Otitis media, unspecified, left ear: Secondary | ICD-10-CM

## 2022-04-20 MED ORDER — AMOXICILLIN-POT CLAVULANATE 875-125 MG PO TABS: 1.0000 | ORAL_TABLET | Freq: Two times a day (BID) | ORAL | 0 refills | Status: AC

## 2022-04-20 NOTE — Telephone Encounter (Signed)
  Chief Complaint: Ear pain Symptoms: sore throat, congestion Frequency: 5 days Pertinent Negatives: Patient denies fever Disposition: []$ ED /[]$ Urgent Care (no appt availability in office) / []$ Appointment(In office/virtual)/ [x]$  [facility]/ []$ Home Care/ []$ Refused Recommended Disposition /[]$ [facility]/ []$  Follow-up with PCP Additional Notes: Pt is recovering form the flu, and now has ear pain and congestion. Pt thinks this is an ear infection.   Summary: Seeking virtual appt, possible ear infection   Pt called and reports that she is recovering from the flu, she believes she has an ear infection. No appts available until next week, seeking a virtual today. Please advise     Reason for Disposition  [1] SEVERE pain AND [2] not improved 2 hours after taking analgesic medication (e.g., ibuprofen or acetaminophen)  Answer Assessment - Initial Assessment Questions 1. LOCATION: "Which ear is involved?"     Left ear 2. ONSET: "When did the ear start hurting"      A few days ago 3. SEVERITY: "How bad is the pain?"  (Scale 1-10; mild, moderate or severe)   - MILD (1-3): doesn't interfere with normal activities    - MODERATE (4-7): interferes with normal activities or awakens from sleep    - SEVERE (8-10): excruciating pain, unable to do any normal activities      9/10 4. URI SYMPTOMS: "Do you have a runny nose or cough?"     Had the flu 5. FEVER: "Do you have a fever?" If Yes, ask: "What is your temperature, how was it measured, and when did it start?"     no 6. CAUSE: "Have you been swimming recently?", "How often do you use Q-TIPS?", "Have you had any recent air travel or scuba diving?"      7. OTHER SYMPTOMS: "Do you have any other symptoms?" (e.g., headache, stiff neck, dizziness, vomiting, runny nose, decreased hearing)     Congestion and a sore throat.  Protocols used: [facility]

## 2022-04-20 NOTE — Patient Instructions (Signed)
Kirke Corin, thank you for joining Perlie Mayo, NP for today's virtual visit.  While this provider is not your primary care provider (PCP), if your PCP is located in our provider database this encounter information will be shared with them immediately following your visit.   [facility] account gives you access to today's visit and all your visits, tests, and labs performed at [facility] " click here if you don't have a [facility] account or go to [email or web address]  Consent: (Patient) Lee-Ann Cavazos provided verbal consent for this virtual visit at the beginning of the encounter.  Current Medications:  Current Outpatient Medications:    amoxicillin-clavulanate (AUGMENTIN) 875-125 MG tablet, Take 1 tablet by mouth 2 (two) times daily for 7 days., Disp: 14 tablet, Rfl: 0   diphenhydrAMINE (BENADRYL) 25 MG tablet, Take 1 tablet (25 mg total) by mouth every 6 (six) hours as needed., Disp: 30 tablet, Rfl: 0   hydrOXYzine (ATARAX) 10 MG tablet, Take 1 tablet (10 mg total) by mouth 3 (three) times daily as needed., Disp: 60 tablet, Rfl: 1   ibuprofen (ADVIL) 600 MG tablet, Take 1 tablet (600 mg total) by mouth every 8 (eight) hours as needed., Disp: 30 tablet, Rfl: 0   medroxyPROGESTERone (DEPO-PROVERA) 150 MG/ML injection, See admin instructions., Disp: , Rfl:    methocarbamol (ROBAXIN) 750 MG tablet, Take 1 tablet (750 mg total) by mouth 3 (three) times daily as needed (muscle spasm/pain)., Disp: 15 tablet, Rfl: 0   metoCLOPramide (REGLAN) 10 MG tablet, Take 1 tablet (10 mg total) by mouth every 8 (eight) hours as needed for nausea (Take with Benadryl for headache nausea.)., Disp: 30 tablet, Rfl: 0   montelukast (SINGULAIR) 10 MG tablet, Take 1 tablet (10 mg total) by mouth at bedtime., Disp: 30 tablet, Rfl: 1   naproxen (NAPROSYN) 500 MG tablet, Take 1 tablet (500 mg total) by mouth 2 (two) times daily., Disp: 30 tablet, Rfl: 0    promethazine-dextromethorphan (PROMETHAZINE-DM) 6.25-15 MG/5ML syrup, Take 5 mLs by mouth 4 (four) times daily as needed for cough., Disp: 118 mL, Rfl: 0   sertraline (ZOLOFT) 25 MG tablet, Take 1 tablet (25 mg total) by mouth daily., Disp: 30 tablet, Rfl: 2   Medications ordered in this encounter:  Meds ordered this encounter  Medications   amoxicillin-clavulanate (AUGMENTIN) 875-125 MG tablet    Sig: Take 1 tablet by mouth 2 (two) times daily for 7 days.    Dispense:  14 tablet    Refill:  0    Order Specific Question:   Supervising Provider    Answer:   Chase Picket [id number]     *If you need refills on other medications prior to your next appointment, please contact your pharmacy*  Follow-Up: Call back or seek an in-person evaluation if the symptoms worsen or if the condition fails to improve as anticipated.  [place] [phone]  Other Instructions  Otitis Media, Adult  Otitis media is a condition in which the middle ear is red and swollen (inflamed) and full of fluid. The middle ear is the part of the ear that contains bones for hearing as well as air that helps send sounds to the brain. The condition usually goes away on its own. What are the causes? This condition is caused by a blockage in the eustachian tube. This tube connects the middle ear to the back of the nose. It normally allows air into the middle ear. The blockage is  caused by fluid or swelling. Problems that can cause blockage include: A cold or infection that affects the nose, mouth, or throat. Allergies. An irritant, such as tobacco smoke. Adenoids that have become large. The adenoids are soft tissue located in the back of the throat, behind the nose and the roof of the mouth. Growth or swelling in the upper part of the throat, just behind the nose (nasopharynx). Damage to the ear caused by a change in pressure. This is called barotrauma. What increases the risk? You are more likely to  develop this condition if you: Smoke or are exposed to tobacco smoke. Have an opening in the roof of your mouth (cleft palate). Have acid reflux. Have problems in your body's defense system (immune system). What are the signs or symptoms? Symptoms of this condition include: Ear pain. Fever. Problems with hearing. Being tired. Fluid leaking from the ear. Ringing in the ear. How is this treated? This condition can go away on its own within 3-5 days. But if the condition is caused by germs (bacteria) and does not go away on its own, or if it keeps coming back, your doctor may: Give you antibiotic medicines. Give you medicines for pain. Follow these instructions at home: Take over-the-counter and prescription medicines only as told by your doctor. If you were prescribed an antibiotic medicine, take it as told by your doctor. Do not stop taking it even if you start to feel better. Keep all follow-up visits. Contact a doctor if: You have bleeding from your nose. There is a lump on your neck. You are not feeling better in 5 days. You feel worse instead of better. Get help right away if: You have pain that is not helped with medicine. You have swelling, redness, or pain around your ear. You get a stiff neck. You cannot move part of your face (paralysis). You notice that the bone behind your ear hurts when you touch it. You get a very bad headache. Summary Otitis media means that the middle ear is red, swollen, and full of fluid. This condition usually goes away on its own. If the problem does not go away, treatment may be needed. You may be given medicines to treat the infection or to treat your pain. If you were prescribed an antibiotic medicine, take it as told by your doctor. Do not stop taking it even if you start to feel better. Keep all follow-up visits. This information is not intended to replace advice given to you by your health care provider. Make sure you discuss any  questions you have with your health care provider. Document Revised: 05/20/2020 Document Reviewed: 05/20/2020 Elsevier Patient Education  [place].   If you have been instructed to have an in-person evaluation today at a local Urgent Care facility, please use the link below. It will take you to a list of all of our available [facility], including address, phone number and hours of operation. Please do not delay care.  [facility]  If you or a family member do not have a primary care provider, use the link below to schedule a visit and establish care. When you choose a [facility] primary care physician or advanced practice provider, you gain a long-term partner in health. Find a Primary Care Provider  Learn more about [facility]'s in-office and virtual care options: [facility]

## 2022-04-20 NOTE — Progress Notes (Signed)
Virtual Visit Consent   Faith Ross, you are scheduled for a virtual visit with a [facility] provider today. Just as with appointments in the office, your consent must be obtained to participate. Your consent will be active for this visit and any virtual visit you may have with one of our providers in the next 365 days. If you have a MyChart account, a copy of this consent can be sent to you electronically.  As this is a virtual visit, video technology does not allow for your provider to perform a traditional examination. This may limit your provider's ability to fully assess your condition. If your provider identifies any concerns that need to be evaluated in person or the need to arrange testing (such as labs, EKG, etc.), we will make arrangements to do so. Although advances in technology are sophisticated, we cannot ensure that it will always work on either your end or our end. If the connection with a video visit is poor, the visit may have to be switched to a telephone visit. With either a video or telephone visit, we are not always able to ensure that we have a secure connection.  By engaging in this virtual visit, you consent to the provision of healthcare and authorize for your insurance to be billed (if applicable) for the services provided during this visit. Depending on your insurance coverage, you may receive a charge related to this service.  I need to obtain your verbal consent now. Are you willing to proceed with your visit today? Faith Ross has provided verbal consent on 04/20/2022 for a virtual visit (video or telephone). Perlie Mayo, NP  Date: 04/20/2022 10:48 AM  Virtual Visit via Video Note   I, Perlie Mayo, connected with  Faith Ross  ([id number], [birth year]) on 04/20/22 at 10:45 AM EST by a video-enabled telemedicine application and verified that I am speaking with the correct person using two identifiers.  Location: Patient: Virtual Visit Location Patient:  Home Provider: Virtual Visit Location Provider: Home Office   I discussed the limitations of evaluation and management by telemedicine and the availability of in person appointments. The patient expressed understanding and agreed to proceed.    History of Present Illness: Faith Ross is a 32 y.o. who identifies as a female who was assigned female at birth, and is being seen today ear pain post flu 2 weeks ago Onset was was feeling better- but then 4 days ago the ear pain started up Associated symptoms are sore throat, congestion. 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   Seborrheic dermatitis 01/22/2022   GAD (generalized anxiety disorder) 04/23/2021   Urinary tract infection without hematuria 03/06/2021   Dental abscess 03/06/2021   BV (bacterial vaginosis) 12/31/2020   Chronic nasal congestion 07/25/2019   Deviated nasal septum 07/25/2019   Nasal turbinate hypertrophy 07/25/2019   Anxiety 12/02/2017   Chest pain 12/02/2017   Palpitations 12/02/2017    Allergies:  Allergies  Allergen Reactions   Toradol [Ketorolac Tromethamine] Anxiety   Medications:  Current Outpatient Medications:    diphenhydrAMINE (BENADRYL) 25 MG tablet, Take 1 tablet (25 mg total) by mouth every 6 (six) hours as needed., Disp: 30 tablet, Rfl: 0   hydrOXYzine (ATARAX) 10 MG tablet, Take 1 tablet (10 mg total) by mouth 3 (three) times daily as  needed., Disp: 60 tablet, Rfl: 1   ibuprofen (ADVIL) 600 MG tablet, Take 1 tablet (600 mg total) by mouth every 8 (eight) hours as needed., Disp: 30 tablet, Rfl: 0   medroxyPROGESTERone (DEPO-PROVERA) 150 MG/ML injection, See admin instructions., Disp: , Rfl:    methocarbamol (ROBAXIN) 750 MG tablet, Take 1 tablet (750 mg total) by mouth 3 (three) times daily as needed (muscle  spasm/pain)., Disp: 15 tablet, Rfl: 0   metoCLOPramide (REGLAN) 10 MG tablet, Take 1 tablet (10 mg total) by mouth every 8 (eight) hours as needed for nausea (Take with Benadryl for headache nausea.)., Disp: 30 tablet, Rfl: 0   montelukast (SINGULAIR) 10 MG tablet, Take 1 tablet (10 mg total) by mouth at bedtime., Disp: 30 tablet, Rfl: 1   naproxen (NAPROSYN) 500 MG tablet, Take 1 tablet (500 mg total) by mouth 2 (two) times daily., Disp: 30 tablet, Rfl: 0   promethazine-dextromethorphan (PROMETHAZINE-DM) 6.25-15 MG/5ML syrup, Take 5 mLs by mouth 4 (four) times daily as needed for cough., Disp: 118 mL, Rfl: 0   sertraline (ZOLOFT) 25 MG tablet, Take 1 tablet (25 mg total) by mouth daily., Disp: 30 tablet, Rfl: 2  Observations/Objective: Patient is well-developed, well-nourished in no acute distress.  Resting comfortably at home.  Head is normocephalic, atraumatic.  No labored breathing.  Speech is clear and coherent with logical content.  Patient is alert and oriented at baseline.   Assessment and Plan:  1. Acute infection of left ear  - amoxicillin-clavulanate (AUGMENTIN) 875-125 MG tablet; Take 1 tablet by mouth 2 (two) times daily for 7 days.  Dispense: 14 tablet; Refill: 0  -ear infection/pain post flu/sinus 2 weeks ago -info on AVS for treatment symptom- Rx given   Reviewed side effects, risks and benefits of medication.   Patient acknowledged agreement and understanding of the plan.   Past Medical, Surgical, Social History, Allergies, and Medications have been Reviewed.    Follow Up Instructions: I discussed the assessment and treatment plan with the patient. The patient was provided an opportunity to ask questions and all were answered. The patient agreed with the plan and demonstrated an understanding of the instructions.  A copy of instructions were sent to the patient via MyChart unless otherwise noted below.    The patient was advised to call back or seek an in-person  evaluation if the symptoms worsen or if the condition fails to improve as anticipated.  Time:  I spent 10 minutes with the patient via telehealth technology discussing the above problems/concerns.    Perlie Mayo, NP

## 2022-04-23 ENCOUNTER — Ambulatory Visit: Payer: Medicaid Other

## 2022-05-20 ENCOUNTER — Ambulatory Visit (INDEPENDENT_AMBULATORY_CARE_PROVIDER_SITE_OTHER): Payer: Self-pay | Admitting: *Deleted

## 2022-05-20 NOTE — Progress Notes (Deleted)
Patient ID: Faith Ross, female    DOB: 1990/08/15  MRN: [id number]  CC: Follow-Up  Subjective: Faith Ross is a 32 y.o. female who presents for follow-up.   Her concerns today include:  05/20/2022 per triage RN call note: Chief Complaint: vaginal bleeding and abdominal pain after intercourse Symptoms: vaginal bleeding after intercourse this am 3 hours ago . Reports heavy bleeding in shower. Dark to bright red in color. Taking depo and normal cycle is spotting . Has not had intercourse in 9 months. Painful intercourse reported. Abdominal cramping  Frequency: this am Pertinent Negatives: Patient denies dizziness no weakness no inability to stand or walk from abdominal pain.  Disposition: [] ED /[] Urgent Care (no appt availability in office) / [x] Appointment(In office/virtual)/ []  [facility]/ [] Home Care/ [] Refused Recommended Disposition /[] [facility]/ []  Follow-up with PCP Additional Notes:    Appt scheduled 05/26/22. Please advise if earlier appt can be made. Patient reports she does not get out of work until 3 pm and will arrive by 3:20 but not by suggested time of 3:05 pm. Please advise for earlier appt.      Reason for Disposition  Bleeding or spotting occurs after sex (Exception: First intercourse.)  Answer Assessment - Initial Assessment Questions 1. AMOUNT: "Describe the bleeding that you are having."    - SPOTTING: spotting, or pinkish / brownish mucous discharge; does not fill panty liner or pad    - MILD:  less than 1 pad / hour; less than patient's usual menstrual bleeding   - MODERATE: 1-2 pads / hour; 1 menstrual cup every 6 hours; small-medium blood clots (e.g., pea, grape, small coin)   - SEVERE: soaking 2 or more pads/hour for 2 or more hours; 1 menstrual cup every 2 hours; bleeding not contained by pads or continuous red blood from vagina; large blood clots (e.g., golf ball, large coin)      Bleeding noted after intercourse this am approx.  3 hours ago . "Large amount of blood while in shower" no clots dark and bright red blood 2. ONSET: "When did the bleeding begin?" "Is it continuing now?"     After intercourse. Has not had intercourse in 9 months  3. MENSTRUAL PERIOD: "When was the last normal menstrual period?" "How is this different than your period?"     On depo and periods are only spotting  4. REGULARITY: "How regular are your periods?"     Only spotting  5. ABDOMEN PAIN: "Do you have any pain?" "How bad is the pain?"  (e.g., Scale 1-10; mild, moderate, or severe)   - MILD (1-3): doesn't interfere with normal activities, abdomen soft and not tender to touch    - MODERATE (4-7): interferes with normal activities or awakens from sleep, abdomen tender to touch    - SEVERE (8-10): excruciating pain, doubled over, unable to do any normal activities      Low abdomen cramping after intercourse , easing off now  6. PREGNANCY: "Is there any chance you are pregnant?" "When was your last menstrual period?"     no 7. BREASTFEEDING: "Are you breastfeeding?"     na 8. HORMONE MEDICINES: "Are you taking any hormone medicines, prescription or over-the-counter?" (e.g., birth control pills, estrogen)     depo 9. BLOOD THINNER MEDICINES: "Do you take any blood thinners?" (e.g., Coumadin / warfarin, Pradaxa / dabigatran, aspirin)     na 10. CAUSE: "What do you think is causing the bleeding?" (e.g., recent gyn surgery, recent  gyn procedure; known bleeding disorder, cervical cancer, polycystic ovarian disease, fibroids)         intercourse 11. HEMODYNAMIC STATUS: "Are you weak or feeling lightheaded?" If Yes, ask: "Can you stand and walk normally?"        No weakness no dizziness can stand and walk normal  12. 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 30 tablet 0   hydrOXYzine (ATARAX) 10 MG tablet Take 1 tablet (10 mg total) by mouth 3 (three) times daily as needed. 60 tablet 1   ibuprofen (ADVIL) 600 MG tablet Take 1 tablet (600 mg total) by mouth every 8 (eight) hours as needed. 30 tablet 0   medroxyPROGESTERone (DEPO-PROVERA) 150 MG/ML injection See admin instructions.     methocarbamol (ROBAXIN) 750 MG tablet Take 1 tablet (750 mg total) by mouth 3 (three) times daily as needed (muscle spasm/pain). 15 tablet 0   metoCLOPramide (REGLAN) 10 MG tablet Take 1 tablet (10 mg total) by mouth every 8 (eight) hours as needed for nausea (Take with Benadryl for headache nausea.). 30 tablet 0   montelukast (SINGULAIR) 10 MG tablet Take 1 tablet (10 mg total) by mouth at bedtime. 30 tablet 1   naproxen (NAPROSYN) 500 MG tablet Take 1 tablet (500 mg total) by mouth 2 (two) times daily. 30 tablet 0   promethazine-dextromethorphan (PROMETHAZINE-DM) 6.25-15 MG/5ML syrup Take 5 mLs by mouth 4 (four) times daily as needed for cough. 118 mL 0   sertraline (ZOLOFT) 25 MG tablet Take 1 tablet (25 mg total) by mouth daily. 30 tablet 2   No current facility-administered medications on file prior to visit.    Allergies  Allergen Reactions   Toradol [Ketorolac Tromethamine] Anxiety    Social History    Socioeconomic History   Marital status: Single    Spouse name: Not on file   Number of children: Not on file   Years of education: Not on file   Highest education level: Not on file  Occupational History   Not on file  Tobacco Use   Smoking status: Never    Passive exposure: Never   Smokeless tobacco: Never  Vaping Use   Vaping Use: Never used  Substance and Sexual Activity   Alcohol use: Not Currently   Drug use: Never   Sexual activity: Not Currently    Birth control/protection: None  Other Topics Concern   Not on file  Social History Narrative   Not on file   Social Determinants of Health   Financial Resource Strain: Not on file  Food Insecurity: Not on file  Transportation Needs: Not on file  Physical Activity: Not on file  Stress: Not on file  Social Connections: Not on file  Intimate Partner Violence: Not on file    Family History  Problem Relation Age of Onset   Thyroid disease Mother     Past Surgical History:  Procedure Laterality Date   TONSILLECTOMY      ROS: Review of Systems Negative except as stated above  PHYSICAL EXAM: There were no vitals taken for this visit.  Physical Exam  {female adult master:310786} {female adult master:310785}     Latest Ref Rng & Units 12/24/2021    9:16 PM 06/03/2021    9:38 PM 05/31/2021    5:32 PM  CMP  Glucose 70 - 99 mg/dL 102  93  121   BUN 6 - 20 mg/dL 8  9  7    Creatinine 0.44 - 1.00 mg/dL 0.96  0.96  1.05   Sodium 135 - 145 mmol/L 137  138  137   Potassium 3.5 - 5.1 mmol/L 3.6  3.5  3.1   Chloride 98 - 111 mmol/L 107  109  108   CO2 22 - 32 mmol/L 24  22  25    Calcium 8.9 - 10.3 mg/dL 9.3  9.3  9.0   Total Protein 6.5 - 8.1 g/dL 9.3     Total Bilirubin 0.3 - 1.2 mg/dL 0.4     Alkaline Phos 38 - 126 U/L 32     AST 15 - 41 U/L 19     ALT 0 - 44 U/L 20      Lipid Panel     Component Value Date/Time   CHOL 110 02/19/2022 1101   TRIG 47 02/19/2022 1101   HDL 40 02/19/2022 1101   CHOLHDL 2.8  02/19/2022 1101   LDLCALC 59 02/19/2022 1101    CBC    Component Value Date/Time   WBC 3.0 (L) 12/24/2021 2116   RBC 3.67 (L) 12/24/2021 2116   HGB 11.3 (L) 12/24/2021 2116   HGB 11.6 11/13/2021 1020   HCT 32.0 (L) 12/24/2021 2116   HCT 34.3 11/13/2021 1020   PLT 244 12/24/2021 2116   PLT 245 11/13/2021 1020   MCV 87.2 12/24/2021 2116   MCV 88 11/13/2021 1020   MCH 30.8 12/24/2021 2116   MCHC 35.3 12/24/2021 2116   RDW 12.2 12/24/2021 2116   RDW 11.9 11/13/2021 1020   LYMPHSABS 0.6 (L) 12/24/2021 2116   LYMPHSABS 1.1 11/13/2021 1020   MONOABS 0.1 12/24/2021 2116   EOSABS 0.0 12/24/2021 2116   EOSABS 0.0 11/13/2021 1020   BASOSABS 0.0 12/24/2021 2116   BASOSABS 0.0 11/13/2021 1020    ASSESSMENT AND PLAN:  There are no diagnoses linked to this encounter.   Patient was given the opportunity to ask questions.  Patient verbalized understanding of the plan and was able to repeat key elements of the plan. Patient was given clear instructions to go to Emergency Department or return to medical center if symptoms don't improve, worsen, or new problems develop.The patient verbalized understanding.   No orders of the defined types were placed in this encounter.    Requested Prescriptions    No prescriptions requested or ordered in this encounter    No follow-ups on file.  Camillia Herter, NP

## 2022-05-20 NOTE — Telephone Encounter (Signed)
Chief Complaint: vaginal bleeding and abdominal pain after intercourse Symptoms: vaginal bleeding after intercourse this am 3 hours ago . Reports heavy bleeding in shower. Dark to bright red in color. Taking depo and normal cycle is spotting . Has not had intercourse in 9 months. Painful intercourse reported. Abdominal cramping  Frequency: this am Pertinent Negatives: Patient denies dizziness no weakness no inability to stand or walk from abdominal pain.  Disposition: [] ED /[] Urgent Care (no appt availability in office) / [x] Appointment(In office/virtual)/ []  [place] [facility]/ [] Home Care/ [] Refused Recommended Disposition /[] [place] [facility]/ []  Follow-up with PCP Additional Notes:   Appt scheduled 05/26/22. Please advise if earlier appt can be made. Patient reports she does not get out of work until 3 pm and will arrive by 3:20 but not by suggested time of 3:05 pm. Please advise for earlier appt.    Reason for Disposition  Bleeding or spotting occurs after sex (Exception: First intercourse.)  Answer Assessment - Initial Assessment Questions 1. AMOUNT: "Describe the bleeding that you are having."    - SPOTTING: spotting, or pinkish / brownish mucous discharge; does not fill panty liner or pad    - MILD:  less than 1 pad / hour; less than patient's usual menstrual bleeding   - MODERATE: 1-2 pads / hour; 1 menstrual cup every 6 hours; small-medium blood clots (e.g., pea, grape, small coin)   - SEVERE: soaking 2 or more pads/hour for 2 or more hours; 1 menstrual cup every 2 hours; bleeding not contained by pads or continuous red blood from vagina; large blood clots (e.g., golf ball, large coin)      Bleeding noted after intercourse this am approx. 3 hours ago . "Large amount of blood while in shower" no clots dark and bright red blood 2. ONSET: "When did the bleeding begin?" "Is it continuing now?"     After intercourse. Has not had intercourse in 9 months  3. MENSTRUAL PERIOD:  "When was the last normal menstrual period?" "How is this different than your period?"     On depo and periods are only spotting  4. REGULARITY: "How regular are your periods?"     Only spotting  5. ABDOMEN PAIN: "Do you have any pain?" "How bad is the pain?"  (e.g., Scale 1-10; mild, moderate, or severe)   - MILD (1-3): doesn't interfere with normal activities, abdomen soft and not tender to touch    - MODERATE (4-7): interferes with normal activities or awakens from sleep, abdomen tender to touch    - SEVERE (8-10): excruciating pain, doubled over, unable to do any normal activities      Low abdomen cramping after intercourse , easing off now  6. PREGNANCY: "Is there any chance you are pregnant?" "When was your last menstrual period?"     no 7. BREASTFEEDING: "Are you breastfeeding?"     na 8. HORMONE MEDICINES: "Are you taking any hormone medicines, prescription or over-the-counter?" (e.g., birth control pills, estrogen)     depo 9. BLOOD THINNER MEDICINES: "Do you take any blood thinners?" (e.g., Coumadin / warfarin, Pradaxa / dabigatran, aspirin)     na 10. CAUSE: "What do you think is causing the bleeding?" (e.g., recent gyn surgery, recent gyn procedure; known bleeding disorder, cervical cancer, polycystic ovarian disease, fibroids)         intercourse 11. HEMODYNAMIC STATUS: "Are you weak or feeling lightheaded?" If Yes, ask: "Can you stand and walk normally?"        No weakness  no dizziness can stand and walk normal  12. OTHER SYMPTOMS: "What other symptoms are you having with the bleeding?" (e.g., passed tissue, vaginal discharge, fever, menstrual-type cramps)       Abdominal cramping  Protocols used: Vaginal Bleeding - Abnormal-A-AH

## 2022-05-26 ENCOUNTER — Ambulatory Visit: Payer: Medicaid Other | Admitting: Family

## 2022-05-26 DIAGNOSIS — Z113 Encounter for screening for infections with a predominantly sexual mode of transmission: Secondary | ICD-10-CM

## 2022-05-28 NOTE — Progress Notes (Signed)
Erroneous encounter-disregard

## 2022-06-04 ENCOUNTER — Encounter: Payer: Medicaid Other | Admitting: Family

## 2022-06-04 DIAGNOSIS — Z113 Encounter for screening for infections with a predominantly sexual mode of transmission: Secondary | ICD-10-CM

## 2022-06-04 DIAGNOSIS — Z3200 Encounter for pregnancy test, result unknown: Secondary | ICD-10-CM

## 2022-06-09 ENCOUNTER — Telehealth: Payer: Self-pay | Admitting: Family

## 2022-06-09 ENCOUNTER — Other Ambulatory Visit: Payer: Self-pay | Admitting: Family

## 2022-06-09 DIAGNOSIS — L659 Nonscarring hair loss, unspecified: Secondary | ICD-10-CM

## 2022-06-09 NOTE — Telephone Encounter (Signed)
Complete

## 2022-06-09 NOTE — Telephone Encounter (Signed)
Copied from CRM [id number]. Topic: Referral - Request for Referral >> Jun 09, 2022 12:02 PM Patsy Lager T wrote: Has patient seen PCP for this complaint? Yes.   Referral for which specialty: Dermatology Preferred provider/office: [facility] Reason for referral: Alopecia

## 2022-06-10 NOTE — Telephone Encounter (Signed)
Attempt to call patient to inform of referral.  No answer and unable to leave voicemail, mailbox is full.

## 2022-06-22 ENCOUNTER — Telehealth: Payer: Self-pay | Admitting: Family

## 2022-06-22 NOTE — Telephone Encounter (Signed)
I have attempted without success to contact this patient by phone to return their call and I left a message on answering machine.

## 2022-06-22 NOTE — Telephone Encounter (Signed)
Pt is calling in because she says her Lymph Nodes are swollen and she's in pain and wanted Amy to call in something for her. Pt says she saw Amy about this last year, but not this year. Pt has a MyChart visit scheduled for 06/23/22.

## 2022-06-23 ENCOUNTER — Encounter: Payer: Medicaid Other | Admitting: Family

## 2022-06-23 NOTE — Progress Notes (Signed)
Erroneous encounter-disregard

## 2022-06-24 ENCOUNTER — Ambulatory Visit: Payer: Self-pay | Admitting: Family Medicine

## 2022-06-24 NOTE — Telephone Encounter (Signed)
Copied from CRM [id number]. Topic: General - Other >> Jun 24, 2022  1:21 PM Dominique A wrote: Reason for CRM: Pt is wanting to see if she can use her [facility] Card for her upcoming visit on 07/15/22 since her medicaid expired yesterday and she never received the link for her virtual visit the other day.    Yes, pt can use the orange card for visit

## 2022-07-06 ENCOUNTER — Ambulatory Visit: Payer: Self-pay | Admitting: *Deleted

## 2022-07-06 NOTE — Telephone Encounter (Signed)
  Chief Complaint: Seasonal allergies:  nasal congestion sneezing and itchy, watery eyes Symptoms: above Frequency: During pollen season Pertinent Negatives: Patient denies OTC medications working. Disposition: [] ED /[] Urgent Care (no appt availability in office) / [] Appointment(In office/virtual)/ []  [place] [facility]/ [x] Home Care/ [] Refused Recommended Disposition /[] [place] [facility]/ []  Follow-up with PCP Additional Notes: She started using Flonase nasal spray earlier today and said it is really helping so she has decided she doesn't want an appt for now.   I let her know if she needs [place] to call us back.

## 2022-07-06 NOTE — Telephone Encounter (Signed)
Message from Valora Piccolo sent at 07/06/2022 10:33 AM EDT  Summary: Allergy Advice   Pt is calling to report allergy sx congestion, running nose, sneezing, eyes itching and irritated . No available appts. Please advise          Call History   Type Contact Phone/Fax User  07/06/2022 10:31 AM EDT Phone ([address] [place], [place] (Self) [phone] Judie Petit) Jens Som A   Reason for Disposition  [1] Nasal allergies AND [2] only certain times of year (hay fever)  Protocols used: Nasal Allergies (Hay Fever)-A-AH

## 2022-07-15 ENCOUNTER — Telehealth: Payer: Medicaid Other | Admitting: Family

## 2022-07-15 ENCOUNTER — Ambulatory Visit: Payer: Medicaid Other | Admitting: Family Medicine

## 2022-07-27 NOTE — Progress Notes (Unsigned)
Patient ID: Faith Ross, female    DOB: 1991/01/20  MRN: [id number]  CC: Referral   Subjective: Faith Ross is a 32 y.o. female who presents for referral.  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 30 tablet 0   hydrOXYzine (ATARAX) 10 MG tablet Take 1 tablet (10 mg total) by mouth 3 (three) times daily as needed. 60 tablet 1   ibuprofen (ADVIL) 600 MG tablet Take 1 tablet (600 mg total) by mouth every 8 (eight) hours as needed. 30 tablet 0   medroxyPROGESTERone (DEPO-PROVERA) 150 MG/ML injection See admin instructions.     methocarbamol (ROBAXIN) 750 MG tablet Take 1 tablet (750 mg total) by mouth 3 (three) times daily as needed (muscle spasm/pain). 15 tablet 0   metoCLOPramide (REGLAN) 10 MG tablet Take 1 tablet (10 mg total) by mouth every 8 (eight) hours as needed for nausea (Take with Benadryl for headache nausea.). 30 tablet 0   montelukast (SINGULAIR) 10 MG tablet Take 1 tablet (10 mg total) by mouth at bedtime. 30 tablet 1   naproxen (NAPROSYN) 500 MG tablet Take 1 tablet (500 mg total) by mouth 2 (two) times daily. 30 tablet 0   promethazine-dextromethorphan (PROMETHAZINE-DM) 6.25-15 MG/5ML syrup Take 5 mLs by mouth 4 (four) times daily as needed for cough. 118 mL 0   sertraline (ZOLOFT) 25  MG tablet Take 1 tablet (25 mg total) by mouth daily. 30 tablet 2   No current facility-administered medications on file prior to visit.    Allergies  Allergen Reactions   Toradol [Ketorolac Tromethamine] Anxiety    Social History   Socioeconomic History   Marital status: Single    Spouse name: Not on file   Number of children: Not on file   Years of education: Not on file   Highest education level: Not on file  Occupational History   Not on file  Tobacco Use   Smoking status: Never    Passive exposure: Never   Smokeless tobacco: Never  Vaping Use   Vaping Use: Never used  Substance and Sexual Activity   Alcohol use: Not Currently   Drug use: Never   Sexual activity: Not Currently    Birth control/protection: None  Other Topics Concern   Not on file  Social History Narrative   Not on file   Social Determinants of Health   Financial Resource Strain: Not on file  Food Insecurity: Not on file  Transportation Needs: Not on file  Physical Activity: Not on file  Stress: Not on file  Social Connections: Not on file  Intimate Partner Violence: Not on file    Family History  Problem Relation Age of Onset   Thyroid disease Mother     Past Surgical History:  Procedure Laterality Date   TONSILLECTOMY      ROS:  Review of Systems Negative except as stated above  PHYSICAL EXAM: There were no vitals taken for this visit.  Physical Exam  {female adult master:310786} {female adult master:310785}     Latest Ref Rng & Units 12/24/2021    9:16 PM 06/03/2021    9:38 PM 05/31/2021    5:32 PM  CMP  Glucose 70 - 99 mg/dL 841  93  660   BUN 6 - 20 mg/dL 8  9  7    Creatinine 0.44 - 1.00 mg/dL 6.30  1.60  1.09   Sodium 135 - 145 mmol/L 137  138  137   Potassium 3.5 - 5.1 mmol/L 3.6  3.5  3.1   Chloride 98 - 111 mmol/L 107  109  108   CO2 22 - 32 mmol/L 24  22  25    Calcium 8.9 - 10.3 mg/dL 9.3  9.3  9.0   Total Protein 6.5 - 8.1 g/dL 9.3     Total Bilirubin 0.3 - 1.2  mg/dL 0.4     Alkaline Phos 38 - 126 U/L 32     AST 15 - 41 U/L 19     ALT 0 - 44 U/L 20      Lipid Panel     Component Value Date/Time   CHOL 110 02/19/2022 1101   TRIG 47 02/19/2022 1101   HDL 40 02/19/2022 1101   CHOLHDL 2.8 02/19/2022 1101   LDLCALC 59 02/19/2022 1101    CBC    Component Value Date/Time   WBC 3.0 (L) 12/24/2021 2116   RBC 3.67 (L) 12/24/2021 2116   HGB 11.3 (L) 12/24/2021 2116   HGB 11.6 11/13/2021 1020   HCT 32.0 (L) 12/24/2021 2116   HCT 34.3 11/13/2021 1020   PLT 244 12/24/2021 2116   PLT 245 11/13/2021 1020   MCV 87.2 12/24/2021 2116   MCV 88 11/13/2021 1020   MCH 30.8 12/24/2021 2116   MCHC 35.3 12/24/2021 2116   RDW 12.2 12/24/2021 2116   RDW 11.9 11/13/2021 1020   LYMPHSABS 0.6 (L) 12/24/2021 2116   LYMPHSABS 1.1 11/13/2021 1020   MONOABS 0.1 12/24/2021 2116   EOSABS 0.0 12/24/2021 2116   EOSABS 0.0 11/13/2021 1020   BASOSABS 0.0 12/24/2021 2116   BASOSABS 0.0 11/13/2021 1020    ASSESSMENT AND PLAN:  There are no diagnoses linked to this encounter.   Patient was given the opportunity to ask questions.  Patient verbalized understanding of the plan and was able to repeat key elements of the plan. Patient was given clear instructions to go to Emergency Department or return to medical center if symptoms don't improve, worsen, or new problems develop.The patient verbalized understanding.   No orders of the defined types were placed in this encounter.    Requested Prescriptions    No prescriptions requested or ordered in this encounter    No follow-ups on file.  Rema Fendt, NP

## 2022-07-28 ENCOUNTER — Ambulatory Visit (INDEPENDENT_AMBULATORY_CARE_PROVIDER_SITE_OTHER): Payer: Medicaid Other | Admitting: Family

## 2022-07-28 ENCOUNTER — Encounter: Payer: Self-pay | Admitting: Family

## 2022-07-28 VITALS — BP 117/71 | HR 71 | Temp 97.6°F | Resp 16 | Wt 124.4 lb

## 2022-07-28 DIAGNOSIS — Z114 Encounter for screening for human immunodeficiency virus [HIV]: Secondary | ICD-10-CM | POA: Diagnosis not present

## 2022-07-28 DIAGNOSIS — N952 Postmenopausal atrophic vaginitis: Secondary | ICD-10-CM

## 2022-07-28 DIAGNOSIS — R599 Enlarged lymph nodes, unspecified: Secondary | ICD-10-CM

## 2022-07-28 DIAGNOSIS — Z1231 Encounter for screening mammogram for malignant neoplasm of breast: Secondary | ICD-10-CM

## 2022-07-28 DIAGNOSIS — J321 Chronic frontal sinusitis: Secondary | ICD-10-CM

## 2022-07-28 DIAGNOSIS — N898 Other specified noninflammatory disorders of vagina: Secondary | ICD-10-CM | POA: Diagnosis not present

## 2022-07-28 DIAGNOSIS — N644 Mastodynia: Secondary | ICD-10-CM

## 2022-07-28 MED ORDER — AMOXICILLIN-POT CLAVULANATE 875-125 MG PO TABS
1.0000 | ORAL_TABLET | Freq: Two times a day (BID) | ORAL | 0 refills | Status: DC
Start: 2022-07-28 — End: 2022-10-23

## 2022-07-28 NOTE — Progress Notes (Unsigned)
-  Patient request referral to ENT - Patient c/o having difficulty breathing and lymph nodes swelling off and on  - no other concerns

## 2022-07-30 LAB — TSH: TSH: 1.06 u[IU]/mL (ref 0.450–4.500)

## 2022-07-30 LAB — HIV ANTIBODY (ROUTINE TESTING W REFLEX): HIV Screen 4th Generation wRfx: NONREACTIVE

## 2022-08-04 NOTE — Progress Notes (Unsigned)
Patient ID: Faith Ross, female    DOB: October 13, 1990  MRN: [id number]  CC: Follow-Up  Subjective: Faith Ross is a 32 y.o. female who presents for follow-up.   Her concerns today include:  Reports EMS was called to her home several days ago related to having panic attacks. Reports panic attacks are related to her job. 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 States her supervisor is "always talking about me to other employees" , saying that she "always has an attitude", and being "mean" to her. Reports she has considered reporting him to [employer] but states feels that he may try to "throw me under the bus" and get her in trouble or try to fire her himself. She does not have any available paid time away from work. Reports she recently graduated from dental hygiene school and planning to begin a new career in the same field soon. Reports she quit taking Sertraline several months ago due to "feeling better" and recently began taking again several days ago. She would like refills of the same on today. Also, she would like a referral to a therapist. She denies thoughts of self-harm, suicidal ideations, homicidal ideations. No further issues/concerns for discussion today.   Patient Active Problem List   Diagnosis Date Noted   Alopecia areata universalis 01/22/2022   Seborrheic dermatitis 01/22/2022   GAD (generalized anxiety disorder) 04/23/2021   Urinary tract infection without hematuria 03/06/2021   Dental abscess 03/06/2021   BV (bacterial vaginosis) 12/31/2020   Chronic nasal congestion 07/25/2019   Deviated nasal septum 07/25/2019   Nasal turbinate hypertrophy 07/25/2019   Anxiety 12/02/2017   Chest pain 12/02/2017   Palpitations 12/02/2017     Current Outpatient Medications on File Prior to Visit  Medication Sig Dispense Refill   amoxicillin-clavulanate (AUGMENTIN) 875-125 MG tablet Take 1 tablet by mouth 2 (two) times daily. 20 tablet 0   diphenhydrAMINE (BENADRYL)  25 MG tablet Take 1 tablet (25 mg total) by mouth every 6 (six) hours as needed. 30 tablet 0   hydrOXYzine (ATARAX) 10 MG tablet Take 1 tablet (10 mg total) by mouth 3 (three) times daily as needed. 60 tablet 1   ibuprofen (ADVIL) 600 MG tablet Take 1 tablet (600 mg total) by mouth every 8 (eight) hours as needed. 30 tablet 0   medroxyPROGESTERone (DEPO-PROVERA) 150 MG/ML injection See admin instructions.     methocarbamol (ROBAXIN) 750 MG tablet Take 1 tablet (750 mg total) by mouth 3 (three) times daily as needed (muscle spasm/pain). 15 tablet 0   metoCLOPramide (REGLAN) 10 MG tablet Take 1 tablet (10 mg total) by mouth every 8 (eight) hours as needed for nausea (Take with Benadryl for headache nausea.). 30 tablet 0   montelukast (SINGULAIR) 10 MG tablet Take 1 tablet (10 mg total) by mouth at bedtime. 30 tablet 1   naproxen (NAPROSYN) 500 MG tablet Take 1 tablet (500 mg total) by mouth 2 (two) times daily. 30 tablet 0   promethazine-dextromethorphan (PROMETHAZINE-DM) 6.25-15 MG/5ML syrup Take 5 mLs by mouth 4 (four) times daily as needed for cough. 118 mL 0   No current facility-administered medications on file prior to visit.    Allergies  Allergen Reactions   Toradol [Ketorolac Tromethamine] Anxiety    Social History   Socioeconomic History   Marital status: Single    Spouse name: Not on file   Number of children: Not on file   Years of education: Not on file   Highest education level:  Not on file  Occupational History   Not on file  Tobacco Use   Smoking status: Never    Passive exposure: Never   Smokeless tobacco: Never  Vaping Use   Vaping Use: Never used  Substance and Sexual Activity   Alcohol use: Not Currently   Drug use: Never   Sexual activity: Not Currently    Birth control/protection: None  Other Topics Concern   Not on file  Social History Narrative   Not on file   Social Determinants of Health   Financial Resource Strain: Not on file  Food Insecurity:  Not on file  Transportation Needs: Not on file  Physical Activity: Not on file  Stress: Not on file  Social Connections: Not on file  Intimate Partner Violence: Not on file    Family History  Problem Relation Age of Onset   Thyroid disease Mother     Past Surgical History:  Procedure Laterality Date   TONSILLECTOMY      ROS: Review of Systems Negative except as stated above  PHYSICAL EXAM: BP 120/78   Pulse 71   Temp 98.1 F (36.7 C) (Oral)   Resp 20   Wt 124 lb (56.2 kg)   SpO2 98%   BMI 20.01 kg/m   Physical Exam HENT:     Head: Normocephalic and atraumatic.  Eyes:     Extraocular Movements: Extraocular movements intact.     Conjunctiva/sclera: Conjunctivae normal.     Pupils: Pupils are equal, round, and reactive to light.  Cardiovascular:     Rate and Rhythm: Normal rate and regular rhythm.     Pulses: Normal pulses.     Heart sounds: Normal heart sounds.  Pulmonary:     Effort: Pulmonary effort is normal.     Breath sounds: Normal breath sounds.  Musculoskeletal:     Cervical back: Normal range of motion and neck supple.  Neurological:     General: No focal deficit present.     Mental Status: She is alert and oriented to person, place, and time.  Psychiatric:        Mood and Affect: Mood normal.        Behavior: Behavior normal.     ASSESSMENT AND PLAN: 1. Anxiety and depression 2. Panic attacks - Patient denies thoughts of self-harm, suicidal ideations, homicidal ideations. - Sertraline and Hydroxyzine as prescribed. Counseled on medication adherence/adverse effects.  - Referral to Psychiatry for further evaluation/management. During the interim follow-up with primary provider in 4 weeks or sooner if needed until established with referral. - sertraline (ZOLOFT) 25 MG tablet; Take 1 tablet (25 mg total) by mouth daily.  Dispense: 90 tablet; Refill: 0 - hydrOXYzine (VISTARIL) 25 MG capsule; Take 1 capsule (25 mg total) by mouth every 8 (eight)  hours as needed.  Dispense: 30 capsule; Refill: 1 - Ambulatory referral to Psychiatry  Patient was given the opportunity to ask questions.  Patient verbalized understanding of the plan and was able to repeat key elements of the plan. Patient was given clear instructions to go to Emergency Department or return to medical center if symptoms don't improve, worsen, or new problems develop.The patient verbalized understanding.   Orders Placed This Encounter  Procedures   Ambulatory referral to Psychiatry    Requested Prescriptions   Signed Prescriptions Disp Refills   sertraline (ZOLOFT) 25 MG tablet 90 tablet 0    Sig: Take 1 tablet (25 mg total) by mouth daily.   hydrOXYzine (VISTARIL) 25 MG capsule 30  capsule 1    Sig: Take 1 capsule (25 mg total) by mouth every 8 (eight) hours as needed.    Follow-up with primary provider as scheduled.  Rema Fendt, NP

## 2022-08-05 ENCOUNTER — Ambulatory Visit (INDEPENDENT_AMBULATORY_CARE_PROVIDER_SITE_OTHER): Payer: Self-pay | Admitting: Family

## 2022-08-05 VITALS — BP 120/78 | HR 71 | Temp 98.1°F | Resp 20 | Wt 124.0 lb

## 2022-08-05 DIAGNOSIS — F419 Anxiety disorder, unspecified: Secondary | ICD-10-CM

## 2022-08-05 DIAGNOSIS — F32A Depression, unspecified: Secondary | ICD-10-CM

## 2022-08-05 DIAGNOSIS — F410 Panic disorder [episodic paroxysmal anxiety] without agoraphobia: Secondary | ICD-10-CM

## 2022-08-05 MED ORDER — HYDROXYZINE PAMOATE 25 MG PO CAPS
25.0000 mg | ORAL_CAPSULE | Freq: Three times a day (TID) | ORAL | 1 refills | Status: AC | PRN
Start: 2022-08-05 — End: ?

## 2022-08-05 MED ORDER — SERTRALINE HCL 25 MG PO TABS
25.0000 mg | ORAL_TABLET | Freq: Every day | ORAL | 0 refills | Status: AC
Start: 2022-08-05 — End: 2022-11-03

## 2022-08-05 NOTE — Progress Notes (Signed)
-  Patient would like to talk wit provider about having panic attacks again.  -EMS had to be called to patient house from having an attack a few nights ago.

## 2022-08-20 ENCOUNTER — Ambulatory Visit
Admission: RE | Admit: 2022-08-20 | Discharge: 2022-08-20 | Disposition: A | Discharge status: Home / Self Care | Payer: Self-pay | Source: Ambulatory Visit | Attending: Family | Admitting: Family

## 2022-08-20 DIAGNOSIS — R599 Enlarged lymph nodes, unspecified: Secondary | ICD-10-CM

## 2022-09-10 ENCOUNTER — Telehealth: Payer: Self-pay

## 2022-09-10 NOTE — Telephone Encounter (Signed)
Left voicemail for patient to call back and confirm her appointment on 7/25 with Dr. Adrian Blackwater. Informed the patient that if we do not hear back from her by 09/11/22 at 12pm, we will have to cancel her appointment.

## 2022-09-15 ENCOUNTER — Ambulatory Visit: Payer: Self-pay | Admitting: Family

## 2022-09-15 ENCOUNTER — Ambulatory Visit: Payer: Self-pay

## 2022-09-15 DIAGNOSIS — Z3042 Encounter for surveillance of injectable contraceptive: Secondary | ICD-10-CM

## 2022-09-17 ENCOUNTER — Encounter: Payer: Self-pay | Admitting: Family Medicine

## 2022-09-17 ENCOUNTER — Ambulatory Visit: Payer: Self-pay | Admitting: Family

## 2022-09-24 ENCOUNTER — Ambulatory Visit: Payer: Self-pay | Admitting: Family

## 2022-10-14 ENCOUNTER — Telehealth: Payer: Self-pay | Admitting: Emergency Medicine

## 2022-10-14 ENCOUNTER — Ambulatory Visit: Payer: Self-pay

## 2022-10-14 DIAGNOSIS — R519 Headache, unspecified: Secondary | ICD-10-CM

## 2022-10-14 MED ORDER — IBUPROFEN 600 MG PO TABS: 600.0000 mg | ORAL_TABLET | Freq: Three times a day (TID) | ORAL | 0 refills | Status: AC | PRN

## 2022-10-14 NOTE — Patient Instructions (Addendum)
[name], thank you for joining Cathlyn Parsons, NP for today's virtual visit.  While this provider is not your primary care provider (PCP), if your PCP is located in our provider database this encounter information will be shared with them immediately following your visit.   A [facility] MyChart account gives you access to today's visit and all your visits, tests, and labs performed at [facility] " click here if you don't have a [facility] MyChart account or go to [email or web address]  Consent: (Patient) Faith Ross provided verbal consent for this virtual visit at the beginning of the encounter.  Current Medications:  Current Outpatient Medications:    ibuprofen (ADVIL) 600 MG tablet, Take 1 tablet (600 mg total) by mouth every 8 (eight) hours as needed., Disp: 30 tablet, Rfl: 0   amoxicillin-clavulanate (AUGMENTIN) 875-125 MG tablet, Take 1 tablet by mouth 2 (two) times daily., Disp: 20 tablet, Rfl: 0   diphenhydrAMINE (BENADRYL) 25 MG tablet, Take 1 tablet (25 mg total) by mouth every 6 (six) hours as needed., Disp: 30 tablet, Rfl: 0   hydrOXYzine (ATARAX) 10 MG tablet, Take 1 tablet (10 mg total) by mouth 3 (three) times daily as needed., Disp: 60 tablet, Rfl: 1   hydrOXYzine (VISTARIL) 25 MG capsule, Take 1 capsule (25 mg total) by mouth every 8 (eight) hours as needed., Disp: 30 capsule, Rfl: 1   medroxyPROGESTERone (DEPO-PROVERA) 150 MG/ML injection, See admin instructions., Disp: , Rfl:    methocarbamol (ROBAXIN) 750 MG tablet, Take 1 tablet (750 mg total) by mouth 3 (three) times daily as needed (muscle spasm/pain)., Disp: 15 tablet, Rfl: 0   metoCLOPramide (REGLAN) 10 MG tablet, Take 1 tablet (10 mg total) by mouth every 8 (eight) hours as needed for nausea (Take with Benadryl for headache nausea.)., Disp: 30 tablet, Rfl: 0   montelukast (SINGULAIR) 10 MG tablet, Take 1 tablet (10 mg total) by mouth at bedtime., Disp: 30 tablet, Rfl: 1   naproxen (NAPROSYN)  500 MG tablet, Take 1 tablet (500 mg total) by mouth 2 (two) times daily., Disp: 30 tablet, Rfl: 0   promethazine-dextromethorphan (PROMETHAZINE-DM) 6.25-15 MG/5ML syrup, Take 5 mLs by mouth 4 (four) times daily as needed for cough., Disp: 118 mL, Rfl: 0   sertraline (ZOLOFT) 25 MG tablet, Take 1 tablet (25 mg total) by mouth daily., Disp: 90 tablet, Rfl: 0   Medications ordered in this encounter:  Meds ordered this encounter  Medications   ibuprofen (ADVIL) 600 MG tablet    Sig: Take 1 tablet (600 mg total) by mouth every 8 (eight) hours as needed.    Dispense:  30 tablet    Refill:  0     *If you need refills on other medications prior to your next appointment, please contact your pharmacy*  Follow-Up: Call back or seek an in-person evaluation if the symptoms worsen or if the condition fails to improve as anticipated.  [facility] [phone]  Other Instructions Try contacting a [facility] financial counselor. [phone] is the number for Medicaid eligiblity assistance. I'm hoping they can help you and if they can't, they can help you figure out who else to contact.   Please see a dentist as soon as you are able. And I think you should have Ms. Stephens recheck your lymph nodes.     If you have been instructed to have an in-person evaluation today at a local Urgent Care facility, please use the link below. It will take you to  a list of all of our available [facility], including address, phone number and hours of operation. Please do not delay care.  [place] [facility]  If you or a family member do not have a primary care provider, use the link below to schedule a visit and establish care. When you choose a [facility] primary care physician or advanced practice provider, you gain a long-term partner in health. Find a Primary Care Provider  Learn more about [facility]'s in-office and virtual care options: [facility] - Get Care Now

## 2022-10-14 NOTE — Telephone Encounter (Signed)
Chief Complaint: Facial swelling left jaw area Symptoms: 4/10 swelling, pain to tooth 4/10 Frequency: swelling about a month ago Pertinent Negatives: Patient denies other symptoms Disposition: [] ED /[] Urgent Care (no appt availability in office) / [] Appointment(In office/virtual)/ [x]  [facility]/ [] Home Care/ [] Refused Recommended Disposition /[] [facility]/ []  Follow-up with PCP Additional Notes: She only wants to see Amy in the office and she requested a virtual visit, advised no availability, offered [facility] in the morning or [facility] today, she agrees to [employer], scheduled at 1730 today.    Summary: swollen jaw   Pt called in says left side of jaw is swollen from tooth     Reason for Disposition  [1] Mild face swelling (puffiness) AND [2] persists > 3 days  Answer Assessment - Initial Assessment Questions 1. ONSET: "When did the swelling start?" (e.g., minutes, hours, days)     1 month ago 2. LOCATION: "What part of the face is swollen?"     Left jaw, lymph nodes 3. SEVERITY: "How swollen is it?"     4 4. ITCHING: "Is there any itching?" If Yes, ask: "How much?"   (Scale 1-10; mild, moderate or severe)     N/A 5. PAIN: "Is the swelling painful to touch?" If Yes, ask: "How painful is it?"   (Scale 1-10; mild, moderate or severe)   - NONE (0): no pain   - MILD (1-3): doesn't interfere with normal activities    - MODERATE (4-7): interferes with normal activities or awakens from sleep    - SEVERE (8-10): excruciating pain, unable to do any normal activities      4 6. FEVER: "Do you have a fever?" If Yes, ask: "What is it, how was it measured, and when did it start?"      No 7. CAUSE: "What do you think is causing the face swelling?"     Tooth  8. RECURRENT SYMPTOM: "Have you had face swelling before?" If Yes, ask: "When was the last time?" "What happened that time?"     Yes, 1 year ago lymph node swelling 9. OTHER SYMPTOMS: "Do you have any  other symptoms?" (e.g., toothache, leg swelling)     Toothache 10. PREGNANCY: "Is there any chance you are pregnant?" "When was your last menstrual period?"       No  Protocols used: Face Swelling-A-AH

## 2022-10-14 NOTE — Progress Notes (Signed)
Virtual Visit Consent   Faith Ross, you are scheduled for a virtual visit with a [facility] provider today. Just as with appointments in the office, your consent must be obtained to participate. Your consent will be active for this visit and any virtual visit you may have with one of our providers in the next 365 days. If you have a MyChart account, a copy of this consent can be sent to you electronically.  As this is a virtual visit, video technology does not allow for your provider to perform a traditional examination. This may limit your provider's ability to fully assess your condition. If your provider identifies any concerns that need to be evaluated in person or the need to arrange testing (such as labs, EKG, etc.), we will make arrangements to do so. Although advances in technology are sophisticated, we cannot ensure that it will always work on either your end or our end. If the connection with a video visit is poor, the visit may have to be switched to a telephone visit. With either a video or telephone visit, we are not always able to ensure that we have a secure connection.  By engaging in this virtual visit, you consent to the provision of healthcare and authorize for your insurance to be billed (if applicable) for the services provided during this visit. Depending on your insurance coverage, you may receive a charge related to this service.  I need to obtain your verbal consent now. Are you willing to proceed with your visit today? Faith Ross has provided verbal consent on 10/14/2022 for a virtual visit (video or telephone). Cathlyn Parsons, NP  Date: 10/14/2022 5:49 PM  Virtual Visit via Video Note   I, Cathlyn Parsons, connected with  Faith Ross  ([id number], 33-08-1990) on 10/14/22 at  5:30 PM EDT by a video-enabled telemedicine application and verified that I am speaking with the correct person using two identifiers.  Location: Patient: Virtual Visit Location Patient:  Home Provider: Virtual Visit Location Provider: Home Office   I discussed the limitations of evaluation and management by telemedicine and the availability of in person appointments. The patient expressed understanding and agreed to proceed.    History of Present Illness: Faith Ross is a 32 y.o. who identifies as a female who was assigned female at birth, and is being seen today for enlarged, painful lymph nodes on either side of her jaw under her ears. Have been this way for a year. Has talked with PCP about them and had thyroid US which was normal. Wants to get antibiotics to see if that helps enlarged lymph nodes. Does not have inusrance. Does have a cavity/poor dentiton. Has not seen a dentist.   HPI: HPI  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   Seborrheic dermatitis 01/22/2022   GAD (generalized anxiety disorder) 04/23/2021   Urinary tract infection without hematuria 03/06/2021   Dental abscess 03/06/2021   BV (bacterial vaginosis) 12/31/2020   Chronic nasal congestion 07/25/2019   Deviated nasal septum 07/25/2019   Nasal turbinate hypertrophy 07/25/2019   Anxiety 12/02/2017   Chest pain 12/02/2017   Palpitations 12/02/2017    Allergies:  Allergies  Allergen Reactions   Toradol [Ketorolac Tromethamine] Anxiety   Medications:  Current Outpatient Medications:    ibuprofen (ADVIL) 600 MG tablet, Take 1 tablet (600 mg total) by mouth every 8 (eight) hours as needed., Disp: 30 tablet, Rfl: 0   amoxicillin-clavulanate (AUGMENTIN)  875-125 MG tablet, Take 1 tablet by mouth 2 (two) times daily., Disp: 20 tablet, Rfl: 0   diphenhydrAMINE (BENADRYL) 25 MG tablet, Take 1 tablet (25 mg total) by mouth every 6 (six) hours as needed., Disp: 30 tablet, Rfl: 0   hydrOXYzine (ATARAX) 10 MG tablet, Take 1 tablet (10 mg total) by mouth 3 (three) times daily as needed., Disp: 60 tablet, Rfl: 1   hydrOXYzine (VISTARIL) 25 MG  capsule, Take 1 capsule (25 mg total) by mouth every 8 (eight) hours as needed., Disp: 30 capsule, Rfl: 1   medroxyPROGESTERone (DEPO-PROVERA) 150 MG/ML injection, See admin instructions., Disp: , Rfl:    methocarbamol (ROBAXIN) 750 MG tablet, Take 1 tablet (750 mg total) by mouth 3 (three) times daily as needed (muscle spasm/pain)., Disp: 15 tablet, Rfl: 0   metoCLOPramide (REGLAN) 10 MG tablet, Take 1 tablet (10 mg total) by mouth every 8 (eight) hours as needed for nausea (Take with Benadryl for headache nausea.)., Disp: 30 tablet, Rfl: 0   montelukast (SINGULAIR) 10 MG tablet, Take 1 tablet (10 mg total) by mouth at bedtime., Disp: 30 tablet, Rfl: 1   naproxen (NAPROSYN) 500 MG tablet, Take 1 tablet (500 mg total) by mouth 2 (two) times daily., Disp: 30 tablet, Rfl: 0   promethazine-dextromethorphan (PROMETHAZINE-DM) 6.25-15 MG/5ML syrup, Take 5 mLs by mouth 4 (four) times daily as needed for cough., Disp: 118 mL, Rfl: 0   sertraline (ZOLOFT) 25 MG tablet, Take 1 tablet (25 mg total) by mouth daily., Disp: 90 tablet, Rfl: 0  Observations/Objective: Patient is well-developed, well-nourished in no acute distress.  Resting comfortably  at home.  Head is normocephalic, atraumatic.  No labored breathing.  Speech is clear and coherent with logical content.  Patient is alert and oriented at baseline.    Assessment and Plan: 1. Facial pain  Discussed needing to see dentist. It's possible pt's dentition is causing her symptoms but since it has been a problem for a year, it may not be an acute problem. Pt needs to f/u with PCP. Rx ibuprofen for pain  Follow Up Instructions: I discussed the assessment and treatment plan with the patient. The patient was provided an opportunity to ask questions and all were answered. The patient agreed with the plan and demonstrated an understanding of the instructions.  A copy of instructions were sent to the patient via MyChart unless otherwise noted below.   The  patient was advised to call back or seek an in-person evaluation if the symptoms worsen or if the condition fails to improve as anticipated.  Time:  I spent 13 minutes with the patient via telehealth technology discussing the above problems/concerns.    Cathlyn Parsons, NP

## 2022-10-23 ENCOUNTER — Telehealth (INDEPENDENT_AMBULATORY_CARE_PROVIDER_SITE_OTHER): Payer: Self-pay | Admitting: Family

## 2022-10-23 DIAGNOSIS — K0889 Other specified disorders of teeth and supporting structures: Secondary | ICD-10-CM

## 2022-10-23 MED ORDER — AMOXICILLIN-POT CLAVULANATE 875-125 MG PO TABS
1.0000 | ORAL_TABLET | Freq: Two times a day (BID) | ORAL | 0 refills | Status: AC
Start: 2022-10-23 — End: ?

## 2022-10-23 NOTE — Progress Notes (Signed)
Virtual Visit via Video Note  I connected with Faith Ross, on 10/23/2022 at 8:09 AM by video and verified that I am speaking with the correct person using two identifiers.  Consent: I discussed the limitations, risks, security and privacy concerns of performing an evaluation and management service by video and the availability of in person appointments. I also discussed with the patient that there may be a patient responsible charge related to this service. The patient expressed understanding and agreed to proceed.   Location of Patient: Home  Location of Provider: [facility]   Persons participating in Telemedicine visit: Sinthia Oyola Ricky Stabs, NP Curley Spice, CMA   History of Present Illness: Faith Ross is a 32 y.o. female who presents for tooth pain. Reports left upper and right lower tooth pain for several days. She is taking Ibuprofen with minimal relief. Reports she does not have dental insurance. No further issues/concerns for discussion today.   Past Medical History:  Diagnosis Date   Alopecia    Anxiety    Chest wall pain    Iron deficiency anemia    Allergies  Allergen Reactions   Toradol [Ketorolac Tromethamine] Anxiety    Current Outpatient Medications on File Prior to Visit  Medication Sig Dispense Refill   amoxicillin-clavulanate (AUGMENTIN) 875-125 MG tablet Take 1 tablet by mouth 2 (two) times daily. 20 tablet 0   diphenhydrAMINE (BENADRYL) 25 MG tablet Take 1 tablet (25 mg total) by mouth every 6 (six) hours as needed. 30 tablet 0   hydrOXYzine (ATARAX) 10 MG tablet Take 1 tablet (10 mg total) by mouth 3 (three) times daily as needed. 60 tablet 1   hydrOXYzine (VISTARIL) 25 MG capsule Take 1 capsule (25 mg total) by mouth every 8 (eight) hours as needed. 30 capsule 1   ibuprofen (ADVIL) 600 MG tablet Take 1 tablet (600 mg total) by mouth every 8 (eight) hours as needed. 30 tablet 0   medroxyPROGESTERone  (DEPO-PROVERA) 150 MG/ML injection See admin instructions.     methocarbamol (ROBAXIN) 750 MG tablet Take 1 tablet (750 mg total) by mouth 3 (three) times daily as needed (muscle spasm/pain). 15 tablet 0   metoCLOPramide (REGLAN) 10 MG tablet Take 1 tablet (10 mg total) by mouth every 8 (eight) hours as needed for nausea (Take with Benadryl for headache nausea.). 30 tablet 0   montelukast (SINGULAIR) 10 MG tablet Take 1 tablet (10 mg total) by mouth at bedtime. 30 tablet 1   naproxen (NAPROSYN) 500 MG tablet Take 1 tablet (500 mg total) by mouth 2 (two) times daily. 30 tablet 0   promethazine-dextromethorphan (PROMETHAZINE-DM) 6.25-15 MG/5ML syrup Take 5 mLs by mouth 4 (four) times daily as needed for cough. 118 mL 0   sertraline (ZOLOFT) 25 MG tablet Take 1 tablet (25 mg total) by mouth daily. 90 tablet 0   No current facility-administered medications on file prior to visit.    Observations/Objective: Alert and oriented x 3. Not in acute distress. Physical examination not completed as this is a telemedicine visit.  Assessment and Plan: 1. Tooth pain - Amoxicillin-Clavulanate as prescribed. Counseled on medication adherence.  - Referral to Dentistry for further evaluation/management. Also, patient states she plans to come to our office later today to pickup dental resources list. - amoxicillin-clavulanate (AUGMENTIN) 875-125 MG tablet; Take 1 tablet by mouth 2 (two) times daily.  Dispense: 20 tablet; Refill: 0 - Ambulatory referral to Dentistry   Follow Up Instructions: Referral to Dentistry. Follow-up  with primary provider as scheduled.   Patient was given clear instructions to go to Emergency Department or return to medical center if symptoms don't improve, worsen, or new problems develop.The patient verbalized understanding.  I discussed the assessment and treatment plan with the patient. The patient was provided an opportunity to ask questions and all were answered. The patient agreed  with the plan and demonstrated an understanding of the instructions.   The patient was advised to call back or seek an in-person evaluation if the symptoms worsen or if the condition fails to improve as anticipated.   I provided 5 minutes total of non-face-to-face time during this encounter.   Rema Fendt, NP  [facility] at [facility], [place] [phone] 10/23/2022, 8:09 AM

## 2022-10-29 NOTE — Telephone Encounter (Signed)
See routing comment(s) for message information.

## 2022-11-20 ENCOUNTER — Ambulatory Visit
Admission: EM | Admit: 2022-11-20 | Discharge: 2022-11-20 | Disposition: A | Discharge status: Home / Self Care | Payer: Self-pay | Attending: Internal Medicine | Admitting: Internal Medicine

## 2022-11-20 DIAGNOSIS — G44209 Tension-type headache, unspecified, not intractable: Secondary | ICD-10-CM

## 2022-11-20 DIAGNOSIS — S161XXA Strain of muscle, fascia and tendon at neck level, initial encounter: Secondary | ICD-10-CM

## 2022-11-20 DIAGNOSIS — M549 Dorsalgia, unspecified: Secondary | ICD-10-CM

## 2022-11-20 MED ORDER — CYCLOBENZAPRINE HCL 5 MG PO TABS: 5.0000 mg | ORAL_TABLET | Freq: Every evening | ORAL | 0 refills | Status: AC | PRN

## 2022-11-20 MED ORDER — NAPROXEN 500 MG PO TABS: 500.0000 mg | ORAL_TABLET | Freq: Two times a day (BID) | ORAL | 0 refills | Status: AC
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
## 2023-02-23 ENCOUNTER — Encounter: Payer: 59 | Admitting: Family

## 2023-02-23 ENCOUNTER — Encounter: Payer: Self-pay | Admitting: Family

## 2023-02-23 NOTE — Progress Notes (Signed)
Erroneous encounter-disregard

## 2023-02-26 DIAGNOSIS — H66002 Acute suppurative otitis media without spontaneous rupture of ear drum, left ear: Secondary | ICD-10-CM | POA: Diagnosis not present

## 2023-03-10 DIAGNOSIS — K047 Periapical abscess without sinus: Secondary | ICD-10-CM | POA: Diagnosis not present

## 2023-03-10 DIAGNOSIS — J069 Acute upper respiratory infection, unspecified: Secondary | ICD-10-CM | POA: Diagnosis not present

## 2023-07-09 DIAGNOSIS — R0789 Other chest pain: Secondary | ICD-10-CM | POA: Diagnosis not present

## 2023-07-09 DIAGNOSIS — J309 Allergic rhinitis, unspecified: Secondary | ICD-10-CM | POA: Diagnosis not present

## 2023-08-05 DIAGNOSIS — S39012A Strain of muscle, fascia and tendon of lower back, initial encounter: Secondary | ICD-10-CM | POA: Diagnosis not present

## 2023-08-05 DIAGNOSIS — M5416 Radiculopathy, lumbar region: Secondary | ICD-10-CM | POA: Diagnosis not present

## 2023-09-14 DIAGNOSIS — H9203 Otalgia, bilateral: Secondary | ICD-10-CM | POA: Diagnosis not present

## 2023-09-14 DIAGNOSIS — J0100 Acute maxillary sinusitis, unspecified: Secondary | ICD-10-CM | POA: Diagnosis not present

## 2023-09-14 DIAGNOSIS — N898 Other specified noninflammatory disorders of vagina: Secondary | ICD-10-CM | POA: Diagnosis not present

## 2023-09-14 DIAGNOSIS — H60313 Diffuse otitis externa, bilateral: Secondary | ICD-10-CM | POA: Diagnosis not present

## 2023-09-30 DIAGNOSIS — Z23 Encounter for immunization: Secondary | ICD-10-CM | POA: Diagnosis not present

## 2023-12-22 DIAGNOSIS — R0981 Nasal congestion: Secondary | ICD-10-CM | POA: Diagnosis not present

## 2023-12-22 DIAGNOSIS — H6993 Unspecified Eustachian tube disorder, bilateral: Secondary | ICD-10-CM | POA: Diagnosis not present

## 2024-01-11 DIAGNOSIS — M5441 Lumbago with sciatica, right side: Secondary | ICD-10-CM | POA: Diagnosis not present

## 2024-01-11 DIAGNOSIS — G8929 Other chronic pain: Secondary | ICD-10-CM | POA: Diagnosis not present

## 2024-01-11 DIAGNOSIS — M5442 Lumbago with sciatica, left side: Secondary | ICD-10-CM | POA: Diagnosis not present

## 2024-02-07 DIAGNOSIS — L249 Irritant contact dermatitis, unspecified cause: Secondary | ICD-10-CM | POA: Diagnosis not present

## 2024-02-17 DIAGNOSIS — J101 Influenza due to other identified influenza virus with other respiratory manifestations: Secondary | ICD-10-CM | POA: Diagnosis not present

## 2024-02-17 DIAGNOSIS — Z20822 Contact with and (suspected) exposure to covid-19: Secondary | ICD-10-CM | POA: Diagnosis not present

## 2024-02-19 DIAGNOSIS — R051 Acute cough: Secondary | ICD-10-CM | POA: Diagnosis not present

## 2024-02-19 DIAGNOSIS — J101 Influenza due to other identified influenza virus with other respiratory manifestations: Secondary | ICD-10-CM | POA: Diagnosis not present
